# Patient Record
Sex: Female | Born: 1950
Health system: Southern US, Community
[De-identification: ages and names within clinical notes are randomized; demographics above are authoritative.]

## PROBLEM LIST (undated history)

## (undated) DIAGNOSIS — I10 Essential (primary) hypertension: Secondary | ICD-10-CM

## (undated) DIAGNOSIS — E785 Hyperlipidemia, unspecified: Secondary | ICD-10-CM

## (undated) DIAGNOSIS — T7840XA Allergy, unspecified, initial encounter: Secondary | ICD-10-CM

## (undated) HISTORY — DX: Essential (primary) hypertension: I10

## (undated) HISTORY — DX: Allergy, unspecified, initial encounter: T78.40XA

## (undated) HISTORY — PX: WRIST FRACTURE SURGERY: SHX121

## (undated) HISTORY — PX: FRACTURE SURGERY: SHX138

## (undated) HISTORY — DX: Hyperlipidemia, unspecified: E78.5

## (undated) HISTORY — PX: APPENDECTOMY: SHX54

---

## 1974-12-27 HISTORY — PX: ECTOPIC PREGNANCY SURGERY: SHX613

## 1997-02-18 ENCOUNTER — Encounter: Payer: Self-pay | Admitting: Family Medicine

## 2004-12-27 HISTORY — PX: TRIGGER FINGER RELEASE: SHX641

## 2005-02-24 DIAGNOSIS — Z8669 Personal history of other diseases of the nervous system and sense organs: Secondary | ICD-10-CM | POA: Insufficient documentation

## 2005-03-22 ENCOUNTER — Emergency Department: Payer: Self-pay | Admitting: Emergency Medicine

## 2006-06-21 ENCOUNTER — Ambulatory Visit: Payer: Self-pay | Admitting: Unknown Physician Specialty

## 2006-07-12 ENCOUNTER — Ambulatory Visit: Payer: Self-pay | Admitting: Unknown Physician Specialty

## 2006-08-04 ENCOUNTER — Emergency Department: Payer: Self-pay | Admitting: Emergency Medicine

## 2006-08-08 ENCOUNTER — Other Ambulatory Visit: Payer: Self-pay

## 2006-08-08 ENCOUNTER — Ambulatory Visit: Payer: Self-pay | Admitting: Specialist

## 2007-02-02 ENCOUNTER — Ambulatory Visit: Payer: Self-pay | Admitting: Unknown Physician Specialty

## 2007-06-01 ENCOUNTER — Ambulatory Visit: Payer: Self-pay | Admitting: Family Medicine

## 2008-02-15 LAB — CONVERTED CEMR LAB: Pap Smear: NORMAL

## 2008-03-15 ENCOUNTER — Ambulatory Visit: Payer: Self-pay | Admitting: Family Medicine

## 2008-03-15 DIAGNOSIS — E785 Hyperlipidemia, unspecified: Secondary | ICD-10-CM | POA: Insufficient documentation

## 2008-03-15 DIAGNOSIS — K219 Gastro-esophageal reflux disease without esophagitis: Secondary | ICD-10-CM | POA: Insufficient documentation

## 2008-03-15 DIAGNOSIS — I152 Hypertension secondary to endocrine disorders: Secondary | ICD-10-CM | POA: Insufficient documentation

## 2008-03-15 DIAGNOSIS — I10 Essential (primary) hypertension: Secondary | ICD-10-CM | POA: Insufficient documentation

## 2008-03-15 DIAGNOSIS — E1169 Type 2 diabetes mellitus with other specified complication: Secondary | ICD-10-CM | POA: Insufficient documentation

## 2008-03-21 ENCOUNTER — Ambulatory Visit: Payer: Self-pay | Admitting: Unknown Physician Specialty

## 2008-04-08 ENCOUNTER — Ambulatory Visit: Payer: Self-pay | Admitting: Family Medicine

## 2008-04-12 DIAGNOSIS — E119 Type 2 diabetes mellitus without complications: Secondary | ICD-10-CM

## 2008-04-12 DIAGNOSIS — E1159 Type 2 diabetes mellitus with other circulatory complications: Secondary | ICD-10-CM | POA: Insufficient documentation

## 2008-04-12 LAB — CONVERTED CEMR LAB
ALT: 20 units/L (ref 0–35)
Albumin: 3.7 g/dL (ref 3.5–5.2)
BUN: 14 mg/dL (ref 6–23)
Bilirubin, Direct: 0.1 mg/dL (ref 0.0–0.3)
CO2: 30 meq/L (ref 19–32)
Calcium: 9.1 mg/dL (ref 8.4–10.5)
Cholesterol: 165 mg/dL (ref 0–200)
Creatinine, Ser: 0.7 mg/dL (ref 0.4–1.2)
Glucose, Bld: 135 mg/dL — ABNORMAL HIGH (ref 70–99)
HDL: 51.4 mg/dL (ref >39.0)
Total Protein: 6.8 g/dL (ref 6.0–8.3)
Triglycerides: 80 mg/dL (ref 0–149)

## 2008-04-18 ENCOUNTER — Ambulatory Visit: Payer: Self-pay | Admitting: Family Medicine

## 2008-04-22 ENCOUNTER — Ambulatory Visit: Payer: Self-pay | Admitting: Family Medicine

## 2008-04-22 LAB — CONVERTED CEMR LAB
Creatinine,U: 143.8 mg/dL
Microalb, Ur: 0.4 mg/dL (ref 0.0–1.9)

## 2008-04-23 ENCOUNTER — Encounter: Payer: Self-pay | Admitting: Family Medicine

## 2008-04-29 ENCOUNTER — Telehealth: Payer: Self-pay | Admitting: Internal Medicine

## 2008-06-25 ENCOUNTER — Encounter: Payer: Self-pay | Admitting: Family Medicine

## 2008-07-15 ENCOUNTER — Ambulatory Visit: Payer: Self-pay | Admitting: Family Medicine

## 2008-07-19 ENCOUNTER — Ambulatory Visit: Payer: Self-pay | Admitting: Family Medicine

## 2008-07-19 ENCOUNTER — Telehealth (INDEPENDENT_AMBULATORY_CARE_PROVIDER_SITE_OTHER): Payer: Self-pay | Admitting: *Deleted

## 2008-07-19 ENCOUNTER — Telehealth: Payer: Self-pay | Admitting: Family Medicine

## 2008-07-19 DIAGNOSIS — F3342 Major depressive disorder, recurrent, in full remission: Secondary | ICD-10-CM | POA: Insufficient documentation

## 2008-07-19 DIAGNOSIS — F331 Major depressive disorder, recurrent, moderate: Secondary | ICD-10-CM | POA: Insufficient documentation

## 2008-07-31 ENCOUNTER — Ambulatory Visit: Payer: Self-pay | Admitting: Family Medicine

## 2008-08-08 ENCOUNTER — Ambulatory Visit: Payer: Self-pay | Admitting: Family Medicine

## 2008-08-21 ENCOUNTER — Ambulatory Visit: Payer: Self-pay | Admitting: Family Medicine

## 2008-09-19 ENCOUNTER — Telehealth: Payer: Self-pay | Admitting: Family Medicine

## 2008-10-21 ENCOUNTER — Ambulatory Visit: Payer: Self-pay | Admitting: Family Medicine

## 2008-10-22 LAB — CONVERTED CEMR LAB: Hgb A1c MFr Bld: 6.8 % — ABNORMAL HIGH (ref 4.6–6.0)

## 2008-10-24 ENCOUNTER — Ambulatory Visit: Payer: Self-pay | Admitting: Family Medicine

## 2009-02-20 ENCOUNTER — Ambulatory Visit: Payer: Self-pay | Admitting: Family Medicine

## 2009-02-25 LAB — CONVERTED CEMR LAB
Albumin: 3.9 g/dL (ref 3.5–5.2)
Bilirubin, Direct: 0.1 mg/dL (ref 0.0–0.3)
Calcium: 9.2 mg/dL (ref 8.4–10.5)
GFR calc Af Amer: 110 mL/min
Glucose, Bld: 109 mg/dL — ABNORMAL HIGH (ref 70–99)
HDL: 46.8 mg/dL (ref >39.0)
Sodium: 143 meq/L (ref 135–145)
Total Protein: 6.5 g/dL (ref 6.0–8.3)
VLDL: 22 mg/dL (ref 0–40)

## 2009-02-27 ENCOUNTER — Ambulatory Visit: Payer: Self-pay | Admitting: Family Medicine

## 2009-04-17 ENCOUNTER — Ambulatory Visit: Payer: Self-pay | Admitting: Family Medicine

## 2009-05-02 ENCOUNTER — Ambulatory Visit: Payer: Self-pay | Admitting: Family Medicine

## 2009-05-06 ENCOUNTER — Telehealth: Payer: Self-pay | Admitting: Family Medicine

## 2009-05-08 ENCOUNTER — Ambulatory Visit: Payer: Self-pay | Admitting: Unknown Physician Specialty

## 2009-06-24 ENCOUNTER — Ambulatory Visit: Payer: Self-pay | Admitting: Family Medicine

## 2009-06-26 LAB — HM DIABETES EYE EXAM: HM Diabetic Eye Exam: NORMAL

## 2009-06-27 LAB — CONVERTED CEMR LAB
ALT: 17 units/L (ref 0–35)
BUN: 12 mg/dL (ref 6–23)
CO2: 30 meq/L (ref 19–32)
Chloride: 107 meq/L (ref 96–112)
Cholesterol: 150 mg/dL (ref 0–200)
Creatinine, Ser: 0.8 mg/dL (ref 0.4–1.2)
Glucose, Bld: 130 mg/dL — ABNORMAL HIGH (ref 70–99)
Hgb A1c MFr Bld: 6.6 % — ABNORMAL HIGH (ref 4.6–6.5)
LDL Cholesterol: 82 mg/dL (ref 0–99)
Microalb, Ur: 0.6 mg/dL (ref 0.0–1.9)
Total Protein: 7.1 g/dL (ref 6.0–8.3)
Triglycerides: 91 mg/dL (ref 0.0–149.0)

## 2009-07-01 ENCOUNTER — Ambulatory Visit: Payer: Self-pay | Admitting: Family Medicine

## 2009-09-29 ENCOUNTER — Ambulatory Visit: Payer: Self-pay | Admitting: Family Medicine

## 2009-10-24 ENCOUNTER — Ambulatory Visit: Payer: Self-pay | Admitting: Family Medicine

## 2009-12-05 ENCOUNTER — Ambulatory Visit: Payer: Self-pay | Admitting: Family Medicine

## 2010-01-27 ENCOUNTER — Telehealth: Payer: Self-pay | Admitting: Family Medicine

## 2010-03-11 ENCOUNTER — Ambulatory Visit: Payer: Self-pay | Admitting: Family Medicine

## 2010-03-18 ENCOUNTER — Telehealth: Payer: Self-pay | Admitting: Family Medicine

## 2010-03-20 ENCOUNTER — Ambulatory Visit: Payer: Self-pay | Admitting: Family Medicine

## 2010-03-24 ENCOUNTER — Telehealth: Payer: Self-pay | Admitting: Family Medicine

## 2010-04-08 ENCOUNTER — Telehealth: Payer: Self-pay | Admitting: Family Medicine

## 2010-10-14 ENCOUNTER — Telehealth: Payer: Self-pay | Admitting: Family Medicine

## 2010-10-19 ENCOUNTER — Ambulatory Visit: Payer: Self-pay | Admitting: Family Medicine

## 2010-10-20 LAB — CONVERTED CEMR LAB
AST: 16 units/L (ref 0–37)
Albumin: 3.8 g/dL (ref 3.5–5.2)
Alkaline Phosphatase: 84 units/L (ref 39–117)
Bilirubin, Direct: 0.1 mg/dL (ref 0.0–0.3)
CO2: 29 meq/L (ref 19–32)
GFR calc non Af Amer: 95.05 mL/min (ref >60)
Glucose, Bld: 109 mg/dL — ABNORMAL HIGH (ref 70–99)
Hgb A1c MFr Bld: 6.8 % — ABNORMAL HIGH (ref 4.6–6.5)
Potassium: 3.9 meq/L (ref 3.5–5.1)
Sodium: 139 meq/L (ref 135–145)
Total CHOL/HDL Ratio: 3
Total Protein: 6.4 g/dL (ref 6.0–8.3)
VLDL: 18 mg/dL (ref 0.0–40.0)

## 2010-10-23 ENCOUNTER — Ambulatory Visit: Payer: Self-pay | Admitting: Family Medicine

## 2010-10-29 ENCOUNTER — Ambulatory Visit: Payer: Self-pay | Admitting: Family Medicine

## 2010-10-29 ENCOUNTER — Encounter: Payer: Self-pay | Admitting: Family Medicine

## 2010-10-30 ENCOUNTER — Encounter (INDEPENDENT_AMBULATORY_CARE_PROVIDER_SITE_OTHER): Payer: Self-pay | Admitting: *Deleted

## 2011-01-26 NOTE — Assessment & Plan Note (Signed)
Summary: BODY ACHES,RINGING IN EARS,CONGESTION   Vital Signs:  Patient profile:   60 year old female Height:      65.75 inches Weight:      189.50 pounds BMI:     30.93 Temp:     98.3 degrees F oral Pulse rate:   84 / minute Pulse rhythm:   regular BP sitting:   132 / 82  (left arm) Cuff size:   regular  Vitals Entered By: Delilah Shan CMA Duncan Dull) (March 11, 2010 11:23 AM) CC: Body aches, ringing in ears, congestion   History of Present Illness: 58 with URI symptoms- over 1 week of body aches, sinus pressure congestion, dry cough. Subjective fevers. No n/v/d. Feels like her sinus pressure is getting worse instead of better. This morning, both ears hurt tremendously and were ringing. No CP, SOB.   Current Medications (verified): 1)  Benazepril-Hydrochlorothiazide 20-12.5 Mg  Tabs (Benazepril-Hydrochlorothiazide) .... Take 1 Tablet By Mouth Once A Day 2)  Crestor 10 Mg Tabs (Rosuvastatin Calcium) .Marland Kitchen.. 1 Tab By Mouth Daily 3)  Fluticasone Propionate 50 Mcg/act  Susp (Fluticasone Propionate) .... As Needed 4)  Multivitamins   Tabs (Multiple Vitamin) .... Take 1 Tablet By Mouth Once A Day 5)  Onetouch Ultra Mini W/device  Kit (Blood Glucose Monitoring Suppl) .... Test Daily or As Directed 6)  Effexor Xr 37.5 Mg  Xr24h-Cap (Venlafaxine Hcl) .Marland Kitchen.. 1 Tab By Mouth Every Other Day 7)  Flonase 50 Mcg/act Susp (Fluticasone Propionate) .... 2 Sprays Each Nostril A Day For Nasal Congestion/allergic Rhinitis 8)  Betamethasone Dipropionate 0.05 % Crea (Betamethasone Dipropionate) .... Aaa Two Times A Day X 2 Weeks, Call If Not Resolving 9)  Amoxicillin 500 Mg Tabs (Amoxicillin) .Marland Kitchen.. 1 Tab By Mouth Two Times A Day X 10 Days  Allergies: 1)  ! Sulfa  Review of Systems      See HPI General:  Complains of chills and fever. ENT:  Complains of earache, nasal congestion, sinus pressure, and sore throat; denies ear discharge. CV:  Denies chest pain or discomfort. Resp:  Complains of cough;  denies shortness of breath, sputum productive, and wheezing.  Physical Exam  General:  Overweight appearin female in NAD VSS Ears:  TMs retracted bilaterally Nose:  nasal dischargemucosal pallor.   sinuses TTP throughout Mouth:  MMM Lungs:  Normal respiratory effort, chest expands symmetrically. Lungs are clear to auscultation, no crackles or wheezes. Heart:  Normal rate and regular rhythm. S1 and S2 normal without gallop, murmur, click, rub or other extra sounds. Extremities:  no edmea Psych:  Cognition and judgment appear intact. Alert and cooperative with normal attention span and concentration. No apparent delusions, illusions, hallucinations   Impression & Recommendations:  Problem # 1:  OTHER ACUTE SINUSITIS (ICD-461.8) Assessment New Given duration and progression of symptoms, will treat for bacterial sinusitis with amoxicillin. See pt instructions for details. Her updated medication list for this problem includes:    Fluticasone Propionate 50 Mcg/act Susp (Fluticasone propionate) .Marland Kitchen... As needed    Flonase 50 Mcg/act Susp (Fluticasone propionate) .Marland Kitchen... 2 sprays each nostril a day for nasal congestion/allergic rhinitis    Amoxicillin 500 Mg Tabs (Amoxicillin) .Marland Kitchen... 1 tab by mouth two times a day x 10 days  Complete Medication List: 1)  Benazepril-hydrochlorothiazide 20-12.5 Mg Tabs (Benazepril-hydrochlorothiazide) .... Take 1 tablet by mouth once a day 2)  Crestor 10 Mg Tabs (Rosuvastatin calcium) .Marland Kitchen.. 1 tab by mouth daily 3)  Fluticasone Propionate 50 Mcg/act Susp (Fluticasone propionate) .... As needed 4)  Multivitamins Tabs (Multiple vitamin) .... Take 1 tablet by mouth once a day 5)  Onetouch Ultra Mini W/device Kit (Blood glucose monitoring suppl) .... Test daily or as directed 6)  Effexor Xr 37.5 Mg Xr24h-cap (Venlafaxine hcl) .Marland Kitchen.. 1 tab by mouth every other day 7)  Flonase 50 Mcg/act Susp (Fluticasone propionate) .... 2 sprays each nostril a day for nasal  congestion/allergic rhinitis 8)  Betamethasone Dipropionate 0.05 % Crea (Betamethasone dipropionate) .... Aaa two times a day x 2 weeks, call if not resolving 9)  Amoxicillin 500 Mg Tabs (Amoxicillin) .Marland Kitchen.. 1 tab by mouth two times a day x 10 days  Patient Instructions: 1)  Take antibiotic as directed.  Drink lots of fluids.  Treat sympotmatically with Mucinex, nasal saline irrigation, and Tylenol/Ibuprofen.   Call if not improving as expected in 5-7 days.  Prescriptions: AMOXICILLIN 500 MG TABS (AMOXICILLIN) 1 tab by mouth two times a day x 10 days  #20 x 0   Entered and Authorized by:   Ruthe Mannan MD   Signed by:   Ruthe Mannan MD on 03/11/2010   Method used:   [facility]        [facility] [facility]       [address]       [place], [place]  [address]       Ph: [phone] or [phone]       Fax: [phone]   RxID:   [id number]    Orders Added: 1)  Est. Patient Level III [34742]   Current Allergies (reviewed today): ! SULFA

## 2011-01-26 NOTE — Progress Notes (Signed)
Summary: burping  Phone Note Call from Patient Call back at [phone]   Caller: Patient Call For: Kerby Nora MD Summary of Call: for 1 week pt has had burping and gas on and off for entire day. Pt feels like has" bubble" at base of throat. Eating and drinking does not seem to effect the burping. Pt has been limiting dairy products. Pt taking Prilosec OTC each morning with no relief. Pt wonders if her meds could be causing this. Pt is going to check her schedule and call for lab and appt with Dr. Ermalene Searing before her Crestor runs out in next 30 days. [phone]. Please advise.   Initial call taken by: Lewanda Rife LPN,  January 27, 2010 10:52 AM  Follow-up for Phone Call        No specific medicaitons that she is on that is obvious culprit.Marland Kitchendoes she notice some association woth meds? Increase OTC prilosec to 40 mg daily if has not already. Follow-up by: Kerby Nora MD,  January 27, 2010 12:10 PM  Additional Follow-up for Phone Call Additional follow up Details #1::        patient advised.Consuello Masse CMA  Additional Follow-up by: Benny Lennert CMA Duncan Dull),  January 27, 2010 12:17 PM

## 2011-01-26 NOTE — Progress Notes (Signed)
Summary: ? reaction to amox  Phone Note Call from Patient Call back at Work Phone [phone]   Caller: Patient Summary of Call: Pt has been taking amox since 3/17 for fluid in her ears.  She broke out in a couple of red areas on sunday, these cleared up but she has a red area on her chin  today.  Doesnt itch, burns a little bit.  She didnt take her dose this morning.  The pressure in her ears and her sinus congestion are better.  Please advise on what she should do.  [facility]. Initial call taken by: Lowella Petties CMA,  March 18, 2010 10:38 AM  Follow-up for Phone Call        I would stop taking it.  Will add amoxicillin to her allergy list.  Thank you for letting us know. Follow-up by: Ruthe Mannan MD,  March 18, 2010 10:56 AM  Additional Follow-up for Phone Call Additional follow up Details #1::        Patient Advised.    Additional Follow-up by: Delilah Shan CMA Duncan Dull),  March 18, 2010 12:15 PM   New Allergies: ! AMOXICILLIN New Allergies: ! AMOXICILLIN

## 2011-01-26 NOTE — Assessment & Plan Note (Signed)
Summary: F/U LABWORK/CLE   Vital Signs:  Patient profile:   60 year old female Height:      65.75 inches Weight:      187.0 pounds BMI:     30.52 Temp:     98.0 degrees F oral Pulse rate:   84 / minute Pulse rhythm:   regular BP sitting:   110 / 78  (left arm) Cuff size:   regular  Vitals Entered By: Benny Lennert CMA Duncan Dull) (October 23, 2010 10:55 AM)  History of Present Illness: Chief complaint follow up labs  The patient is here for annual wellness exam and preventative care.     3 lb weight loss. Some exercise..plans on increasing exercsie.  Diabetes: Wellcontrolled with diet. ON no medicaiton.  Taking chromium.  BMI 30..interesetd in lap band procedure.   Hypertension History:      She denies headache, chest pain, dyspnea with exertion, peripheral edema, visual symptoms, neurologic problems, syncope, and side effects from treatment.  Well controlled at home. Marland Kitchen        Positive major cardiovascular risk factors include female age 26 years old or older, diabetes, hyperlipidemia, and hypertension.  Negative major cardiovascular risk factors include non-tobacco-user status.    Lipid Management History:      Positive NCEP/ATP III risk factors include female age 84 years old or older, diabetes, and hypertension.  Negative NCEP/ATP III risk factors include non-tobacco-user status.        The patient states that she knows about the "Therapeutic Lifestyle Change" diet.  Her compliance with the TLC diet is excellent.  The patient expresses understanding of adjunctive measures for cholesterol lowering.  Adjunctive measures started by the patient include aerobic exercise, fiber, and weight reduction.  She expresses no side effects from her lipid-lowering medication.  The patient denies any symptoms to suggest myopathy or liver disease.      Problems Prior to Update: 1)  Other Screening Mammogram  (ICD-V76.12) 2)  Ringworm  (ICD-110.9) 3)  Other Acute Sinusitis  (ICD-461.8) 4)   Skin Lesion  (ICD-709.9) 5)  Anxiety Depression  (ICD-300.4) 6)  Diabetes Mellitus, Type II, Without Complications  (ICD-250.00) 7)  Gerd  (ICD-530.81) 8)  Family History of Cad Female 1st Degree Relative <50  (ICD-V17.3) 9)  Family History of Cad Female 1st Degree Relative <60  (ICD-V16.49) 10)  Bell's Palsy, Hx of  (ICD-V12.49) 11)  Hypertension  (ICD-401.9) 12)  Hyperlipidemia  (ICD-272.4)  Current Medications (verified): 1)  Benazepril-Hydrochlorothiazide 20-12.5 Mg  Tabs (Benazepril-Hydrochlorothiazide) .... Take 1 Tablet By Mouth Once A Day 2)  Crestor 10 Mg Tabs (Rosuvastatin Calcium) .Marland Kitchen.. 1 Tab By Mouth Daily 3)  Fluticasone Propionate 50 Mcg/act  Susp (Fluticasone Propionate) .... As Needed 4)  Multivitamins   Tabs (Multiple Vitamin) .... Take 1 Tablet By Mouth Once A Day 5)  Onetouch Ultra Mini W/device  Kit (Blood Glucose Monitoring Suppl) .... Test Daily or As Directed 6)  Effexor Xr 37.5 Mg  Xr24h-Cap (Venlafaxine Hcl) .Marland Kitchen.. 1 Tab By Mouth Every Other Day 7)  Flonase 50 Mcg/act Susp (Fluticasone Propionate) .... 2 Sprays Each Nostril A Day For Nasal Congestion/allergic Rhinitis  Allergies: 1)  ! Sulfa 2)  ! Amoxicillin  Past History:  Past medical, surgical, family and social histories (including risk factors) reviewed, and no changes noted (except as noted below).  Past Medical History: Reviewed history from 03/15/2008 and no changes required. Hyperlipidemia Hypertension  Past Surgical History: 1976 ectopic pregnancy 2006 trigger finger 2007 broken left wrist,  surgical  repair with plate 1610 Appendectomy stress test, treadmill 2008 hx of radium treatment to forehead for hemangioma   Family History: Reviewed history from 03/15/2008 and no changes required. father: died age 62s massive MI mother: DM, CHF, CAD died age 37s  Family History of CAD Female 1st degree relative <60 Family History of CAD Female 1st degree relative <50 brother: MI age 62, DM brother:  CABG age 22 sister: WPW sister: valve replacement, TIA ? grandparents, but paternal died in 37s MGF: CVA MGM AAA, colon cancer  Social History: Reviewed history from 03/15/2008 and no changes required. Occupation: Runner, broadcasting/film/video, kindergarten Married adopted children Never Smoked Alcohol use-no Drug use-no Regular exercise-yes, walk dogs daily, occ treadmill 2 x a week Diet: grilled foods, fruits and veggies  Review of Systems General:  Denies fatigue and fever. CV:  Denies chest pain or discomfort. Resp:  Denies shortness of breath. GI:  Denies abdominal pain, bloody stools, constipation, and diarrhea. GU:  Denies dysuria. Derm:  Denies lesion(s). Psych:  Denies anxiety and depression; Has decreased to effexor every other day.Marland Kitchen  Physical Exam  General:  obese appearing female INNAD Eyes:  No corneal or conjunctival inflammation noted. EOMI. Perrla. Funduscopic exam benign, without hemorrhages, exudates or papilledema. Vision grossly normal. Ears:  External ear exam shows no significant lesions or deformities.  Otoscopic examination reveals clear canals, tympanic membranes are intact bilaterally without bulging, retraction, inflammation or discharge. Hearing is grossly normal bilaterally. Nose:  External nasal examination shows no deformity or inflammation. Nasal mucosa are pink and moist without lesions or exudates. Mouth:  Oral mucosa and oropharynx without lesions or exudates.  Teeth in good repair. Neck:  no carotid bruit or thyromegaly no cervical or supraclavicular lymphadenopathy  Lungs:  Normal respiratory effort, chest expands symmetrically. Lungs are clear to auscultation, no crackles or wheezes. Heart:  Normal rate and regular rhythm. S1 and S2 normal without gallop, murmur, click, rub or other extra sounds. Abdomen:  Bowel sounds positive,abdomen soft and non-tender without masses, organomegaly or hernias noted. Pulses:  R and L posterior tibial pulses are full and equal  bilaterally  Extremities:  no edema Skin:  Intact without suspicious lesions or rashes Psych:  Cognition and judgment appear intact. Alert and cooperative with normal attention span and concentration. No apparent delusions, illusions, hallucinations  Diabetes Management Exam:    Foot Exam (with socks and/or shoes not present):       Sensory-Pinprick/Light touch:          Left medial foot (L-4): normal          Left dorsal foot (L-5): normal          Left lateral foot (S-1): normal          Right medial foot (L-4): normal          Right dorsal foot (L-5): normal          Right lateral foot (S-1): normal       Sensory-Monofilament:          Left foot: normal          Right foot: normal       Inspection:          Left foot: normal          Right foot: normal       Nails:          Left foot: normal          Right foot: normal   Impression &  Recommendations:  Problem # 1:  DIABETES MELLITUS, TYPE II, WITHOUT COMPLICATIONS (ICD-250.00) Well controlled with diet.  Discussed exercise and weight loss. Her updated medication list for this problem includes:    Benazepril-hydrochlorothiazide 20-12.5 Mg Tabs (Benazepril-hydrochlorothiazide) .Marland Kitchen... Take 1 tablet by mouth once a day  Problem # 2:  HYPERLIPIDEMIA (ICD-272.4) Well controlled. Continue current medication.  Her updated medication list for this problem includes:    Crestor 10 Mg Tabs (Rosuvastatin calcium) .Marland Kitchen... 1 tab by mouth daily  Labs Reviewed: SGOT: 16 (10/19/2010)   SGPT: 14 (10/19/2010)  Lipid Goals: Chol Goal: 200 (07/01/2009)   HDL Goal: 40 (07/01/2009)   LDL Goal: 100 (07/01/2009)   TG Goal: 150 (07/01/2009)  10 Yr Risk Heart Disease: 8 % Prior 10 Yr Risk Heart Disease: 13 % (07/01/2009)   HDL:50.90 (10/19/2010), 49.80 (06/24/2009)  LDL:79 (10/19/2010), 82 (06/24/2009)  Chol:148 (10/19/2010), 150 (06/24/2009)  Trig:90.0 (10/19/2010), 91.0 (06/24/2009)  Problem # 3:  HYPERTENSION (ICD-401.9) Well controlled.  Continue current medication. Encouraged exercise, weight loss, healthy eating habits.  Her updated medication list for this problem includes:    Benazepril-hydrochlorothiazide 20-12.5 Mg Tabs (Benazepril-hydrochlorothiazide) .Marland Kitchen... Take 1 tablet by mouth once a day  Problem # 4:  ANXIETY DEPRESSION (ICD-300.4) Well controlled.   Complete Medication List: 1)  Benazepril-hydrochlorothiazide 20-12.5 Mg Tabs (Benazepril-hydrochlorothiazide) .... Take 1 tablet by mouth once a day 2)  Crestor 10 Mg Tabs (Rosuvastatin calcium) .Marland Kitchen.. 1 tab by mouth daily 3)  Fluticasone Propionate 50 Mcg/act Susp (Fluticasone propionate) .... As needed 4)  Multivitamins Tabs (Multiple vitamin) .... Take 1 tablet by mouth once a day 5)  Onetouch Ultra Mini W/device Kit (Blood glucose monitoring suppl) .... Test daily or as directed 6)  Effexor Xr 37.5 Mg Xr24h-cap (Venlafaxine hcl) .Marland Kitchen.. 1 tab by mouth every other day 7)  Flonase 50 Mcg/act Susp (Fluticasone propionate) .... 2 sprays each nostril a day for nasal congestion/allergic rhinitis  Hypertension Assessment/Plan:      The patient's hypertensive risk group is category C: Target organ damage and/or diabetes.  Her calculated 10 year risk of coronary heart disease is 8 %.  Today's blood pressure is 110/78.  Her blood pressure goal is < 130/80.  Lipid Assessment/Plan:      Based on NCEP/ATP III, the patient's risk factor category is "history of diabetes".  The patient's lipid goals are as follows: Total cholesterol goal is 200; LDL cholesterol goal is 100; HDL cholesterol goal is 40; Triglyceride goal is 150.  Her LDL cholesterol goal has been met.    Patient Instructions: 1)  Marion..can you get information about the lap band information session at [facility] sugery that they have..come ask me if you have questions.  2)  Please schedule a follow-up appointment in 6 months CPX. 3)   Fasting lipids, CMET , A1C, microalbumin Dx 250.00   Orders Added: 1)  Est.  Patient Level IV [[address]109]    Influenza Vaccine (to be given today)  Current Allergies (reviewed today): ! SULFA ! AMOXICILLIN  Last Flu Vaccine:  Fluvax 3+ (10/24/2008 2:58:18 PM) Flu Vaccine Result Date:  10/23/2010 Flu Vaccine Result:  given Flu Vaccine Next Due:  1 yr Herpes Zoster Next Due:  Refused Mammogram Result Date:  10/27/2009 Mammogram Result:  normal Mammogram Next Due:  1 yr    Past Medical History:    Reviewed history from 03/15/2008 and no changes required:       Hyperlipidemia       Hypertension  Past Surgical History:  Reviewed history from 03/15/2008 and no changes required:       1976 ectopic pregnancy       2006 trigger finger       2007 broken left wrist, surgical  repair with plate       1610 Appendectomy       stress test, treadmill 2008       hx of radium treatment to forehead for hemangioma

## 2011-01-26 NOTE — Progress Notes (Signed)
Summary: Ringworm no better  Phone Note Call from Patient Call back at [facility] Phone [phone]   Caller: Patient Call For: Dr. Dayton Martes Summary of Call: Patient stated that she is being treated for ringworm.  She says that she really can't tell much difference that the medication is even working.  Wanted to know if she could double up on the Terbinafine HCL 250mg , and I advised her not to double up but continue taking it as Dr. Dayton Martes prescribed.  Advise that it may take a little longer to clear up but finish all medication.  Advised her to call us back if any changes or if she doesn't get any better within the next few days.  Initial call taken by: Linde Gillis CMA Duncan Dull),  March 24, 2010 9:03 AM  Follow-up for Phone Call        Agreed, please don't double dose. Follow-up by: Ruthe Mannan MD,  March 24, 2010 9:22 AM

## 2011-01-26 NOTE — Assessment & Plan Note (Signed)
Summary: ? RINGWORM ON CHIN   Vital Signs:  Patient profile:   60 year old female Height:      65.75 inches Weight:      190.13 pounds BMI:     31.03 Temp:     97.6 degrees F oral Pulse rate:   84 / minute Pulse rhythm:   regular BP sitting:   112 / 70  (left arm) Cuff size:   regular  Vitals Entered By: Delilah Shan CMA Duncan Dull) (March 20, 2010 10:56 AM) CC: ? ringworm   History of Present Illness: 60 yo with itchy circular rash on left chin x 1 day. 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 No lesions elsewhere, none in scalp that she is aware of.   Current Medications (verified): 1)  Benazepril-Hydrochlorothiazide 20-12.5 Mg  Tabs (Benazepril-Hydrochlorothiazide) .... Take 1 Tablet By Mouth Once A Day 2)  Crestor 10 Mg Tabs (Rosuvastatin Calcium) .Marland Kitchen.. 1 Tab By Mouth Daily 3)  Fluticasone Propionate 50 Mcg/act  Susp (Fluticasone Propionate) .... As Needed 4)  Multivitamins   Tabs (Multiple Vitamin) .... Take 1 Tablet By Mouth Once A Day 5)  Onetouch Ultra Mini W/device  Kit (Blood Glucose Monitoring Suppl) .... Test Daily or As Directed 6)  Effexor Xr 37.5 Mg  Xr24h-Cap (Venlafaxine Hcl) .Marland Kitchen.. 1 Tab By Mouth Every Other Day 7)  Flonase 50 Mcg/act Susp (Fluticasone Propionate) .... 2 Sprays Each Nostril A Day For Nasal Congestion/allergic Rhinitis 8)  Betamethasone Dipropionate 0.05 % Crea (Betamethasone Dipropionate) .... Aaa Two Times A Day X 2 Weeks, Call If Not Resolving 9)  Terbinafine Hcl 250 Mg Tabs (Terbinafine Hcl) .Marland Kitchen.. 1 Tab By Mouth Daily X 2 Weeks 10)  Miconazole 7 2 % Crea (Miconazole Nitrate) .... Apply To Area Daily X 2 Weeks.  Allergies: 1)  ! Sulfa 2)  ! Amoxicillin  Review of Systems Derm:  Complains of itching and rash.  Physical Exam  General:  Overweight appearin female in NAD VSS Skin:  circular lesion on left chin with central clearing No lesions on scalp Psych:  Cognition and judgment appear intact. Alert and cooperative with  normal attention span and concentration. No apparent delusions, illusions, hallucinations   Impression & Recommendations:  Problem # 1:  RINGWORM (ICD-110.9) Assessment New Although lesion is not on scalp, I do feel she would benefit more from oral rather than topical therapy since it is in such a prominent area on her face.  Pt agrees with plan.  Will do a two week course of terbinafine.    Complete Medication List: 1)  Benazepril-hydrochlorothiazide 20-12.5 Mg Tabs (Benazepril-hydrochlorothiazide) .... Take 1 tablet by mouth once a day 2)  Crestor 10 Mg Tabs (Rosuvastatin calcium) .Marland Kitchen.. 1 tab by mouth daily 3)  Fluticasone Propionate 50 Mcg/act Susp (Fluticasone propionate) .... As needed 4)  Multivitamins Tabs (Multiple vitamin) .... Take 1 tablet by mouth once a day 5)  Onetouch Ultra Mini W/device Kit (Blood glucose monitoring suppl) .... Test daily or as directed 6)  Effexor Xr 37.5 Mg Xr24h-cap (Venlafaxine hcl) .Marland Kitchen.. 1 tab by mouth every other day 7)  Flonase 50 Mcg/act Susp (Fluticasone propionate) .... 2 sprays each nostril a day for nasal congestion/allergic rhinitis 8)  Betamethasone Dipropionate 0.05 % Crea (Betamethasone dipropionate) .... Aaa two times a day x 2 weeks, call if not resolving 9)  Terbinafine Hcl 250 Mg Tabs (Terbinafine hcl) .Marland Kitchen.. 1 tab by mouth daily x 2 weeks 10)  Miconazole 7 2 % Crea (Miconazole  nitrate) .... Apply to area daily x 2 weeks. Prescriptions: MICONAZOLE 7 2 % CREA (MICONAZOLE NITRATE) apply to area daily x 2 weeks.  #15 g x 0   Entered and Authorized by:   Ruthe Mannan MD   Signed by:   Ruthe Mannan MD on 03/20/2010   Method used:   [facility] [id number]       [address]       [place], [place]  [address]       Ph: [phone] or [phone]       Fax: [phone]   RxID:   [id number] TERBINAFINE HCL 250 MG TABS (TERBINAFINE HCL) 1 tab by mouth daily x 2 weeks  #14 x 0   Entered and Authorized by:    Ruthe Mannan MD   Signed by:   Ruthe Mannan MD on 03/20/2010   Method used:   [facility]        [facility] [facility]retail)       [address]       [place], [place]  [address]       Ph: [phone] or [phone]       Fax: [phone]   RxID:   [id number]   Current Allergies (reviewed today): ! SULFA ! AMOXICILLIN  Appended Document: ? RINGWORM ON CHIN Script for terbenifine cancelled at [facility], called to [facility]- it is on their $4.00 list.

## 2011-01-26 NOTE — Progress Notes (Signed)
Summary: needs order for mammogram  Phone Note Call from Patient Call back at Home Phone [phone]   Caller: Patient Call For: Kerby Nora MD Summary of Call: Pt needs order for mammogram, she gets these at [facility]. Initial call taken by: Lowella Petties CMA,  October 14, 2010 8:27 AM  New Problems: OTHER SCREENING MAMMOGRAM (ICD-V76.12)   New Problems: OTHER SCREENING MAMMOGRAM (ICD-V76.12)

## 2011-01-26 NOTE — Progress Notes (Signed)
Summary: regarding ringworm  Phone Note Call from Patient Call back at [facility] Phone [phone]   Caller: Patient Summary of Call: Pt has been using cream for ringworm on her face. She had generic lamisil but has finished that.  The rash is gone but there is still a pink discoloration.  She is asking if that will go away in time or does she need more medicine.  Should she continue to use the cream? Initial call taken by: Lowella Petties CMA,  April 08, 2010 10:07 AM  Follow-up for Phone Call        Hopefully the discoloration will go away.  I would not put anymore cream on, let it heal on its own at this point. Follow-up by: Ruthe Mannan MD,  April 08, 2010 10:23 AM  Additional Follow-up for Phone Call Additional follow up Details #1::        Patient Advised.  Additional Follow-up by: Delilah Shan CMA Duncan Dull),  April 08, 2010 10:31 AM

## 2011-01-26 NOTE — Letter (Signed)
Summary: Results Follow up Letter  [place] [facility]  [address] [place], [place] [address]   Phone: [phone]  Fax: [phone]    10/30/2010 MRN: [id number]     Meghan Kerr [address] [place], [place]  [address]    Dear Ms. Eddington,  The following are the results of your recent test(s):  Test         Result    Pap Smear:        Normal _____  Not Normal _____ Comments: ______________________________________________________ Cholesterol: LDL(Bad cholesterol):         Your goal is less than:         HDL (Good cholesterol):       Your goal is more than: Comments:  ______________________________________________________ Mammogram:        Normal __x___  Not Normal _____ Comments:Repeat in 1 year  ___________________________________________________________________ Hemoccult:        Normal _____  Not normal _______ Comments:    _____________________________________________________________________ Other Tests:    We routinely do not discuss normal results over the telephone.  If you desire a copy of the results, or you have any questions about this information we can discuss them at your next office visit.   Sincerely,   Kerby Nora MD

## 2011-03-02 ENCOUNTER — Encounter: Payer: Self-pay | Admitting: Family Medicine

## 2011-03-03 ENCOUNTER — Ambulatory Visit (INDEPENDENT_AMBULATORY_CARE_PROVIDER_SITE_OTHER): Payer: Self-pay | Admitting: Family Medicine

## 2011-03-03 ENCOUNTER — Encounter: Payer: Self-pay | Admitting: Family Medicine

## 2011-03-03 DIAGNOSIS — J018 Other acute sinusitis: Secondary | ICD-10-CM

## 2011-03-09 NOTE — Assessment & Plan Note (Signed)
Summary: ?SINUS INFECTION/CLE  [employer]   Vital Signs:  Patient profile:   60 year old female Height:      65.75 inches Weight:      191.50 pounds BMI:     31.26 Temp:     98.1 degrees F oral Pulse rate:   71 / minute Pulse rhythm:   regular BP sitting:   140 / 80  (right arm) Cuff size:   regular  Vitals Entered By: Linde Gillis CMA Duncan Dull) (March 03, 2011 12:30 PM) CC: ? sinus infection   History of Present Illness: 60 yo here for ? sinus infection.  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  Started with runny nose, scratchy throat a few days ago. Today felt very tired, chills and sinus pressure. No fever. Taking OTC mucinex with mild relief of symptoms.  Dry hacking cough. No wheezing or SOB.  Current Medications (verified): 1)  Benazepril-Hydrochlorothiazide 20-12.5 Mg  Tabs (Benazepril-Hydrochlorothiazide) .... Take 1 Tablet By Mouth Once A Day 2)  Crestor 10 Mg Tabs (Rosuvastatin Calcium) .Marland Kitchen.. 1 Tab By Mouth Daily 3)  Fluticasone Propionate 50 Mcg/act  Susp (Fluticasone Propionate) .... As Needed 4)  Multivitamins   Tabs (Multiple Vitamin) .... Take 1 Tablet By Mouth Once A Day 5)  Onetouch Ultra Mini W/device  Kit (Blood Glucose Monitoring Suppl) .... Test Daily or As Directed 6)  Effexor Xr 37.5 Mg  Xr24h-Cap (Venlafaxine Hcl) .Marland Kitchen.. 1 Tab By Mouth Every Other Day 7)  Flonase 50 Mcg/act Susp (Fluticasone Propionate) .... 2 Sprays Each Nostril A Day For Nasal Congestion/allergic Rhinitis 8)  Tussionex Pennkinetic Er 8-10 Mg/29ml Lqcr (Chlorpheniramine-Hydrocodone) .... 5 Ml By Mouth Two Times A Day As Needed Cough 9)  Azithromycin 250 Mg  Tabs (Azithromycin) .... 2 By  Mouth Today and Then 1 Daily For 4 Days  Allergies: 1)  ! Sulfa 2)  ! Amoxicillin  Past History:  Past Medical History: Last updated: 2008-04-13 Hyperlipidemia Hypertension  Past Surgical History: Last updated: 10/23/2010 1976 ectopic pregnancy 2006 trigger  finger 2007 broken left wrist, surgical  repair with plate 0454 Appendectomy stress test, treadmill 2008 hx of radium treatment to forehead for hemangioma   Family History: Last updated: 04/13/08 father: died age 93s massive MI mother: DM, CHF, CAD died age 32s  Family History of CAD Female 1st degree relative <60 Family History of CAD Female 1st degree relative <50 brother: MI age 78, DM brother: CABG age 78 sister: WPW sister: valve replacement, TIA ? grandparents, but paternal died in 42s MGF: CVA MGM AAA, colon cancer  Social History: Last updated: April 13, 2008 Occupation: Runner, broadcasting/film/video, kindergarten Married adopted children Never Smoked Alcohol use-no Drug use-no Regular exercise-yes, walk dogs daily, occ treadmill 2 x a week Diet: grilled foods, fruits and veggies  Risk Factors: Exercise: yes (Apr 13, 2008)  Risk Factors: Smoking Status: never (2008/04/13)  Review of Systems      See HPI General:  Complains of chills and fatigue; denies fever. ENT:  Complains of nasal congestion, sinus pressure, and sore throat. Resp:  Complains of cough; denies shortness of breath, sputum productive, and wheezing.  Physical Exam  General:  obese appearing female NAD VSS Ears:  External ear exam shows no significant lesions or deformities.  Otoscopic examination reveals clear canals, tympanic membranes are intact bilaterally without bulging, retraction, inflammation or discharge. Hearing is grossly normal bilaterally. Nose:  boggy turbinates, sinuses +/- TTP Mouth:  Oral mucosa and oropharynx without lesions or exudates.  Teeth in good repair. Lungs:  Normal respiratory effort, chest expands symmetrically. Lungs are clear to auscultation, no crackles or wheezes. Heart:  Normal rate and regular rhythm. S1 and S2 normal without gallop, murmur, click, rub or other extra sounds. Extremities:  no edema Psych:  Cognition and judgment appear intact. Alert and cooperative with normal  attention span and concentration. No apparent delusions, illusions, hallucinations   Impression & Recommendations:  Problem # 1:  OTHER ACUTE SINUSITIS (ICD-461.8) Assessment New Given rx for Zpack to fill if symptoms do not improve. Continue mucinex, flonase. Tussionex as needed cough- see pt instructions for details. Her updated medication list for this problem includes:    Fluticasone Propionate 50 Mcg/act Susp (Fluticasone propionate) .Marland Kitchen... As needed    Flonase 50 Mcg/act Susp (Fluticasone propionate) .Marland Kitchen... 2 sprays each nostril a day for nasal congestion/allergic rhinitis    Tussionex Pennkinetic Er 8-10 Mg/46ml Lqcr (Chlorpheniramine-hydrocodone) .Marland KitchenMarland KitchenMarland KitchenMarland Kitchen 5 ml by mouth two times a day as needed cough    Azithromycin 250 Mg Tabs (Azithromycin) .Marland Kitchen... 2 by  mouth today and then 1 daily for 4 days  Complete Medication List: 1)  Benazepril-hydrochlorothiazide 20-12.5 Mg Tabs (Benazepril-hydrochlorothiazide) .... Take 1 tablet by mouth once a day 2)  Crestor 10 Mg Tabs (Rosuvastatin calcium) .Marland Kitchen.. 1 tab by mouth daily 3)  Fluticasone Propionate 50 Mcg/act Susp (Fluticasone propionate) .... As needed 4)  Multivitamins Tabs (Multiple vitamin) .... Take 1 tablet by mouth once a day 5)  Onetouch Ultra Mini W/device Kit (Blood glucose monitoring suppl) .... Test daily or as directed 6)  Effexor Xr 37.5 Mg Xr24h-cap (Venlafaxine hcl) .Marland Kitchen.. 1 tab by mouth every other day 7)  Flonase 50 Mcg/act Susp (Fluticasone propionate) .... 2 sprays each nostril a day for nasal congestion/allergic rhinitis 8)  Tussionex Pennkinetic Er 8-10 Mg/58ml Lqcr (Chlorpheniramine-hydrocodone) .... 5 ml by mouth two times a day as needed cough 9)  Azithromycin 250 Mg Tabs (Azithromycin) .... 2 by  mouth today and then 1 daily for 4 days  Patient Instructions: 1)  Fill your prescription for your antibiotic (Zpack) if symptoms do not improve over the next several day.  Drink lots of fluids.  Treat sympotmatically with Mucinex,  nasal saline irrigation, and Tylenol/IbuprofenYou can use warm compresses.  Cough suppressant at night. Call if not improving as expected in 5-7 days.  Prescriptions: AZITHROMYCIN 250 MG  TABS (AZITHROMYCIN) 2 by  mouth today and then 1 daily for 4 days  #6 x 0   Entered and Authorized by:   Ruthe Mannan MD   Signed by:   Ruthe Mannan MD on 03/03/2011   Method used:   Print then Give to Patient   RxID:   [id number] TUSSIONEX PENNKINETIC ER 8-10 MG/5ML LQCR (CHLORPHENIRAMINE-HYDROCODONE) 5 ml by mouth two times a day as needed cough  #5 ounces x 0   Entered and Authorized by:   Ruthe Mannan MD   Signed by:   Ruthe Mannan MD on 03/03/2011   Method used:   Print then Give to Patient   RxID:   [id number]    Orders Added: 1)  Est. Patient Level III [47425]    Current Allergies (reviewed today): ! SULFA ! AMOXICILLIN

## 2011-03-25 LAB — LIPID PANEL
Cholesterol: 126 mg/dL (ref 0–200)
HDL: 48 mg/dL (ref 35–70)
LDL Cholesterol: 13 mg/dL
LDl/HDL Ratio: 2.6

## 2011-03-25 LAB — BASIC METABOLIC PANEL
BUN: 17 mg/dL (ref 4–21)
Creatinine: 0.8 mg/dL (ref 0.5–1.1)

## 2011-03-25 LAB — HEPATIC FUNCTION PANEL: Bilirubin, Total: 0.4 mg/dL

## 2011-03-25 LAB — CBC AND DIFFERENTIAL
Neutrophils Absolute: 60 /uL
Platelets: 311 10*3/uL (ref 150–399)

## 2011-04-15 ENCOUNTER — Encounter: Payer: Self-pay | Admitting: Family Medicine

## 2011-04-15 ENCOUNTER — Other Ambulatory Visit: Payer: Self-pay

## 2011-04-19 ENCOUNTER — Other Ambulatory Visit: Payer: Self-pay | Admitting: Family Medicine

## 2011-04-23 ENCOUNTER — Encounter: Payer: Self-pay | Admitting: Family Medicine

## 2011-04-23 ENCOUNTER — Ambulatory Visit (INDEPENDENT_AMBULATORY_CARE_PROVIDER_SITE_OTHER): Payer: BC Managed Care – PPO | Admitting: Family Medicine

## 2011-04-23 ENCOUNTER — Other Ambulatory Visit (HOSPITAL_COMMUNITY)
Admission: RE | Admit: 2011-04-23 | Discharge: 2011-04-23 | Disposition: A | Discharge status: Home / Self Care | Payer: BC Managed Care – PPO | Source: Ambulatory Visit | Attending: Family Medicine | Admitting: Family Medicine

## 2011-04-23 ENCOUNTER — Encounter: Payer: Self-pay | Admitting: *Deleted

## 2011-04-23 DIAGNOSIS — E119 Type 2 diabetes mellitus without complications: Secondary | ICD-10-CM

## 2011-04-23 DIAGNOSIS — I10 Essential (primary) hypertension: Secondary | ICD-10-CM

## 2011-04-23 DIAGNOSIS — Z1231 Encounter for screening mammogram for malignant neoplasm of breast: Secondary | ICD-10-CM

## 2011-04-23 DIAGNOSIS — Z01419 Encounter for gynecological examination (general) (routine) without abnormal findings: Secondary | ICD-10-CM | POA: Insufficient documentation

## 2011-04-23 DIAGNOSIS — Z13820 Encounter for screening for osteoporosis: Secondary | ICD-10-CM

## 2011-04-23 DIAGNOSIS — Z0000 Encounter for general adult medical examination without abnormal findings: Secondary | ICD-10-CM

## 2011-04-23 DIAGNOSIS — K219 Gastro-esophageal reflux disease without esophagitis: Secondary | ICD-10-CM

## 2011-04-23 DIAGNOSIS — Z780 Asymptomatic menopausal state: Secondary | ICD-10-CM

## 2011-04-23 DIAGNOSIS — Z1159 Encounter for screening for other viral diseases: Secondary | ICD-10-CM | POA: Insufficient documentation

## 2011-04-23 DIAGNOSIS — E785 Hyperlipidemia, unspecified: Secondary | ICD-10-CM

## 2011-04-23 DIAGNOSIS — F341 Dysthymic disorder: Secondary | ICD-10-CM

## 2011-04-23 NOTE — Assessment & Plan Note (Signed)
Well controlled with diet. 

## 2011-04-23 NOTE — Assessment & Plan Note (Signed)
Inadequate control...increase prilosec to 40 mg daily. Discussed diet changes.

## 2011-04-23 NOTE — Assessment & Plan Note (Signed)
Very well controlled.. Will stop crestor and recheck in next 3 months.

## 2011-04-23 NOTE — Progress Notes (Signed)
Subjective:    Patient ID: Meghan Kerr, female    DOB: 03/25/1951, 60 y.o.   MRN: [id number]  HPI  Diabetes: Well controlled Using medications without difficulties: Hypoglycemic episodes:None Hyperglycemic episodes:NONE Feet problems:None Blood Sugars averaging:not checking eye exam within last year:yes  Hypertension:  Well controlled  Using medication without problems or lightheadedness:  Chest pain with exertion: None  Edema: None Short of breath: None Average home BPs:   Elevated Cholesterol: Well controlled Using medications without problems. Muscle aches:  Other complaints:  [facility]Marland Kitchen Lost 10 lbs. Stopped this med because was causing constiption, stomach irritation and burping.  GERD, poor control with pepcid AC, prilosec 20 mg. Almost daily symptoms. In past nexium helped.   Constipation.. Last BM yesterday. Walking daily, working on [employer], lots of water, some fiber.  Review of Systems  Constitutional: Negative for fever, fatigue and unexpected weight change.  HENT: Negative for ear pain, congestion, sore throat, sneezing, trouble swallowing and sinus pressure.   Eyes: Negative for pain and itching.  Respiratory: Negative for cough, shortness of breath and wheezing.   Cardiovascular: Negative for chest pain, palpitations and leg swelling.  Gastrointestinal: Negative for nausea, abdominal pain, diarrhea, constipation and blood in stool.  Genitourinary: Negative for dysuria, hematuria, vaginal discharge, difficulty urinating and menstrual problem.  Skin: Negative for rash.  Neurological: Negative for syncope, weakness, light-headedness, numbness and headaches.  Psychiatric/Behavioral: Negative for confusion and dysphoric mood. The patient is not nervous/anxious.        Objective:   Physical Exam  Constitutional: Vital signs are normal. She appears well-developed and well-nourished. She is cooperative.  Non-toxic  appearance. She does not appear ill. No distress.  HENT:  Head: Normocephalic.  Right Ear: Hearing, tympanic membrane, external ear and ear canal normal.  Left Ear: Hearing, tympanic membrane, external ear and ear canal normal.  Nose: Nose normal.  Eyes: Conjunctivae, EOM and lids are normal. Pupils are equal, round, and reactive to light. No foreign bodies found.  Neck: Trachea normal and normal range of motion. Neck supple. Carotid bruit is not present. No mass and no thyromegaly present.  Cardiovascular: Normal rate, regular rhythm, S1 normal, S2 normal, normal heart sounds and intact distal pulses.  Exam reveals no gallop.   No murmur heard. Pulmonary/Chest: Effort normal and breath sounds normal. No respiratory distress. She has no wheezes. She has no rhonchi. She has no rales.  Abdominal: Soft. Normal appearance and bowel sounds are normal. She exhibits no distension, no fluid wave, no abdominal bruit and no mass. There is no hepatosplenomegaly. There is no tenderness. There is no rebound, no guarding and no CVA tenderness. No hernia.  Genitourinary: Vagina normal and uterus normal. No breast swelling, tenderness, discharge or bleeding. Pelvic exam was performed with patient prone. There is no rash, tenderness or lesion on the right labia. There is no rash, tenderness or lesion on the left labia. Uterus is not enlarged and not tender. Cervix exhibits motion tenderness. Cervix exhibits no discharge and no friability. Right adnexum displays no mass, no tenderness and no fullness. Left adnexum displays no mass, no tenderness and no fullness.  Lymphadenopathy:    She has no cervical adenopathy.    She has no axillary adenopathy.  Neurological: She is alert. She has normal strength. No cranial nerve deficit or sensory deficit.  Skin: Skin is warm, dry and intact. No rash noted.  Psychiatric: Her speech is normal and behavior is normal. Judgment normal. Her mood appears not  anxious. Cognition and  memory are normal. She does not exhibit a depressed mood.    Diabetic foot exam: Normal inspection No skin breakdown No calluses  Normal DP pulses Normal sensation to light tough and monofilament Nails normal        Assessment & Plan:  Complete Physical Exam: The patient's preventative maintenance and recommended screening tests for an annual wellness exam were reviewed in full today. Brought up to date unless services declined.  Counselled on the importance of diet, exercise, and its role in overall health and mortality. The patient's FH and SH was reviewed, including their home life, tobacco status, and drug and alcohol status.

## 2011-04-23 NOTE — Patient Instructions (Addendum)
For GERD: Increase prilosec to 40 mg daily.. X 4-6 weeks, tehn consider tapering off/down. If no improvement at all in 2 weeks call.. For [place] to try prescription. Increase fiber gradually in diet, as well as water for constipation.  Can use miralax for severe constipation. Let me know if not improving.  Stop cholesterol medication.Marland Kitchen Keep up good work with diet and lifestyle.  Stop by front desk to talk with Marion/Cynthia to set up DXA and mammo in 10/2011.

## 2011-04-23 NOTE — Assessment & Plan Note (Signed)
Well controlled. Continue current medication.  

## 2011-05-03 ENCOUNTER — Encounter: Payer: Self-pay | Admitting: *Deleted

## 2011-05-04 ENCOUNTER — Telehealth: Payer: Self-pay | Admitting: *Deleted

## 2011-05-04 NOTE — Telephone Encounter (Signed)
Opened in error

## 2011-05-07 NOTE — Progress Notes (Signed)
Addended byKerby Nora on: 05/07/2011 04:55 PM   Modules accepted: Orders

## 2011-05-11 NOTE — Progress Notes (Signed)
Addended byKerby Nora on: 05/11/2011 05:26 PM   Modules accepted: Orders

## 2011-05-19 ENCOUNTER — Other Ambulatory Visit: Payer: Self-pay | Admitting: Family Medicine

## 2011-05-19 ENCOUNTER — Encounter: Payer: Self-pay | Admitting: Family Medicine

## 2011-05-19 ENCOUNTER — Other Ambulatory Visit: Payer: BC Managed Care – PPO

## 2011-05-19 ENCOUNTER — Ambulatory Visit (INDEPENDENT_AMBULATORY_CARE_PROVIDER_SITE_OTHER): Payer: BC Managed Care – PPO | Admitting: Family Medicine

## 2011-05-19 VITALS — BP 110/78 | HR 58 | Temp 97.9°F | Ht 66.0 in | Wt 179.8 lb

## 2011-05-19 DIAGNOSIS — M722 Plantar fascial fibromatosis: Secondary | ICD-10-CM

## 2011-05-19 MED ORDER — GLUCOSE BLOOD VI STRP: ORAL_STRIP | Status: AC

## 2011-05-19 NOTE — Patient Instructions (Signed)
Excellent Over the Counter Orthotics: Hapad: available at [email or web address] SPENCO: Available at some sports stores or www.amazon.com  SHOES: Danskos, [place]rells, [place], [place] - good arch support, want minimal bendability Shoes: Birkenstock shoes, [employer] [place], [address], [place], [place]

## 2011-05-19 NOTE — Progress Notes (Signed)
60 year old female:  1 month h/p L heel pain.  The patient presents with a 1 month long history of heel pain. This is notable for worsening pain first thing in the morning when arising and standing after sitting.   Prior foot or ankle fractures: none Prior operations: none Orthotics or bracing: none Medications: none PT or home rehab: none Night splints: no Ice massage: no Ball massage: no  Metatarsal pain: no  The PMH, PSH, Social History, Family History, Medications, and allergies have been reviewed in [facility], and have been updated if relevant.  REVIEW OF SYSTEMS  GEN: No fevers, chills. Nontoxic. Primarily MSK c/o today. MSK: Detailed in the HPI GI: tolerating PO intake without difficulty Neuro: No numbness, parasthesias, or tingling associated. Otherwise the pertinent positives of the ROS are noted above.   GEN: Well-developed,well-nourished,in no acute distress; alert,appropriate and cooperative throughout examination HEENT: Normocephalic and atraumatic without obvious abnormalities. Ears, externally no deformities PULM: Breathing comfortably in no respiratory distress EXT: No clubbing, cyanosis, or edema PSYCH: Normally interactive. Cooperative during the interview. Pleasant. Friendly and conversant. Not anxious or depressed appearing. Normal, full affect.  Echymosis: no Edema: no ROM: full LE B Gait: heel toe, non-antalgic MT pain: no Callus pattern: none Lateral Mall: NT Medial Mall: NT Talus: NT Navicular: NT Calcaneous: NT Metatarsals: NT 5th MT: NT Phalanges: NT Achilles: NT Plantar Fascia: tender, medial along PF. Pain with forced dorsi Fat Pad: NT Peroneals: NT Post Tib: NT Great Toe: Nml motion Ant Drawer: neg Other foot breakdown: none Long arch: preserved Transverse arch: preserved Hindfoot breakdown: none Sensation: intact  A/P: Plantar fascitis: We reviewed that stretching is critically important to the treatment of PF. Reviewed footwear.  Rigid soles have been shown to help with PF. Reviewed rehab of stretching and calf raises.   Added arch binder, rehab program

## 2011-05-22 ENCOUNTER — Other Ambulatory Visit: Payer: Self-pay | Admitting: Family Medicine

## 2011-05-26 ENCOUNTER — Ambulatory Visit: Payer: Self-pay | Admitting: Family Medicine

## 2011-06-08 ENCOUNTER — Encounter: Payer: Self-pay | Admitting: Family Medicine

## 2011-06-08 DIAGNOSIS — M8580 Other specified disorders of bone density and structure, unspecified site: Secondary | ICD-10-CM | POA: Insufficient documentation

## 2011-07-15 ENCOUNTER — Other Ambulatory Visit (INDEPENDENT_AMBULATORY_CARE_PROVIDER_SITE_OTHER): Payer: BC Managed Care – PPO

## 2011-07-15 DIAGNOSIS — E785 Hyperlipidemia, unspecified: Secondary | ICD-10-CM

## 2011-07-15 LAB — LIPID PANEL: Cholesterol: 245 mg/dL — ABNORMAL HIGH (ref 0–200)

## 2011-07-20 ENCOUNTER — Telehealth: Payer: Self-pay | Admitting: Family Medicine

## 2011-07-20 DIAGNOSIS — E7800 Pure hypercholesterolemia, unspecified: Secondary | ICD-10-CM

## 2011-07-20 MED ORDER — ROSUVASTATIN CALCIUM 5 MG PO TABS: 5.0000 mg | ORAL_TABLET | Freq: Every day | ORAL | Status: DC

## 2011-07-20 NOTE — Telephone Encounter (Signed)
Sent in crestor 5 mg daily.  Have pt return for las in 3 months fasting.

## 2011-07-20 NOTE — Telephone Encounter (Signed)
Message copied by Excell Seltzer on Tue Jul 20, 2011  4:59 PM ------      Message from: Consuello Masse      Created: Tue Jul 20, 2011  4:25 PM       Patient is agreeable to starting the crestor 5mg  [facility]

## 2011-07-21 ENCOUNTER — Encounter: Payer: Self-pay | Admitting: *Deleted

## 2011-07-21 NOTE — Telephone Encounter (Signed)
Appt made and patient advised via letter

## 2011-09-16 ENCOUNTER — Encounter: Payer: Self-pay | Admitting: Family Medicine

## 2011-09-16 ENCOUNTER — Ambulatory Visit (INDEPENDENT_AMBULATORY_CARE_PROVIDER_SITE_OTHER)
Admission: RE | Admit: 2011-09-16 | Discharge: 2011-09-16 | Disposition: A | Discharge status: Home / Self Care | Payer: BC Managed Care – PPO | Source: Ambulatory Visit | Attending: Family Medicine | Admitting: Family Medicine

## 2011-09-16 ENCOUNTER — Ambulatory Visit (INDEPENDENT_AMBULATORY_CARE_PROVIDER_SITE_OTHER): Payer: BC Managed Care – PPO | Admitting: Family Medicine

## 2011-09-16 VITALS — BP 130/80 | HR 61 | Temp 98.0°F | Wt 181.5 lb

## 2011-09-16 DIAGNOSIS — M25559 Pain in unspecified hip: Secondary | ICD-10-CM

## 2011-09-16 DIAGNOSIS — M25552 Pain in left hip: Secondary | ICD-10-CM

## 2011-09-16 MED ORDER — MELOXICAM 15 MG PO TABS: 15.0000 mg | ORAL_TABLET | Freq: Every day | ORAL | Status: DC

## 2011-09-16 NOTE — Patient Instructions (Addendum)
Good to see you. Please take Meloxicam as needed (with food). Xray was negative (likely bursitis)   Calcium supplements have received some bad press lately, with questions that they may increase risk of heart attack or blood clots.  The risk is very low, however none of these risks occur with calcium in FOOD. Try to get most or all of your calcium from your food--aim for 1000 mg/day for women up to 50 and men up to 70 and 1200 mg/day for women over 50 and men over 70.  To figure out dietary calcium: 300 mg/day from all non dairy foods plus 300 mg per cup of milk, other dairy, or fortified juice.  Non dairy foods that contain calcium:  Kale, oranges, sardines, oatmeal, soy milk/soybeans, salmon, white beans, dried figs, turnip greens, almonds, broccoli, tofu.

## 2011-09-16 NOTE — Progress Notes (Signed)
Subjective:     Meghan Kerr is a 60 y.o. female who presents for evaluation of left hip pain. Patient has a 4 months history of pain, tenderness in the area of the left hip. The pain is sharp at times and is on the outside of the hip. There is no radiation down the leg. The patient has not had this type of problem before. The patient does exercise regularly. Symptoms may have been triggered by a fall down the stairs several months ago. Onset was gradual. The symptoms are moderate. Associated symptoms include: joint pain. Patient denies memory loss, nausea, new headache, nodules and polyuria. Aggravating factors: movement and standing. Alleviating factors: nothing. Previous treatments include OTC meds.  DEXA consistent with osteopenia in 04/2011.  Patient Active Problem List  Diagnoses  . DIABETES MELLITUS, TYPE II, WITHOUT COMPLICATIONS  . HYPERLIPIDEMIA  . ANXIETY DEPRESSION  . HYPERTENSION  . GERD  . BELL'S PALSY, HX OF  . Osteopenia  . Left hip pain   Past Medical History  Diagnosis Date  . Hyperlipidemia   . Hypertension    Past Surgical History  Procedure Date  . Ectopic pregnancy surgery 1976  . Trigger finger release 2006  . Wrist fracture surgery     broken left wrist repair with plate  . Appendectomy    History  Substance Use Topics  . Smoking status: Never Smoker   . Smokeless tobacco: Not on file  . Alcohol Use: No   Family History  Problem Relation Age of Onset  . Diabetes Mother   . Heart disease Mother     cad and chf  . Heart disease Father     heart attack (massive)  . Cancer Maternal Grandmother     COLON  . Stroke Maternal Grandfather   . Diabetes Brother   . Heart disease Brother 70    hert attack  . Heart disease Brother 32    CABG   . Stroke Sister   . Heart disease Sister     VALVE REPLACEMENT   Allergies  Allergen Reactions  . Amoxicillin     REACTION: rash  . Sulfonamide Derivatives     REACTION: Blisters on leg   Current  Outpatient Prescriptions on File Prior to Visit  Medication Sig Dispense Refill  . benazepril-hydrochlorthiazide (LOTENSIN HCT) 20-12.5 MG per tablet TAKE 1 TABLET BY MOUTH EVERY DAY  30 tablet  8  . Blood Glucose Monitoring Suppl (ONE TOUCH ULTRA MINI) W/DEVICE KIT by Does not apply route. Test daily or as directed       . fluticasone (FLONASE) 50 MCG/ACT nasal spray 2 sprays by Nasal route daily.        Marland Kitchen glucose blood (ONE TOUCH TEST STRIPS) test strip Use as instructed  100 each  12  . rosuvastatin (CRESTOR) 5 MG tablet Take 1 tablet (5 mg total) by mouth at bedtime.  30 tablet  11  . venlafaxine (EFFEXOR-XR) 37.5 MG 24 hr capsule TAKE 1 CAPSULE EVERY DAY BY MOUTH  30 capsule  5   The PMH, PSH, Social History, Family History, Medications, and allergies have been reviewed in [facility], and have been updated if relevant.   Review of Systems See HPI  Objective:  BP 130/80  Pulse 61  Temp(Src) 98 F (36.7 C) (Oral)  Wt 181 lb 8 oz (82.328 kg)   BP 130/80  Pulse 61  Temp(Src) 98 F (36.7 C) (Oral)  Wt 181 lb 8 oz (82.328 kg)  General Appearance:  Alert, cooperative, no distress, appears stated age  Head:    Normocephalic, without obvious abnormality, atraumatic  Eyes:    PERRL, conjunctiva/corneas clear, EOM's intact, fundi    benign, both eyes  Ears:    Normal TM's and external ear canals, both ears  Nose:   Nares normal, septum midline, mucosa normal, no drainage    or sinus tenderness  Throat:   Lips, mucosa, and tongue normal; teeth and gums normal  Neck:   Supple, symmetrical, trachea midline, no adenopathy;    thyroid:  no enlargement/tenderness/nodules; no carotid   bruit or JVD  Back:     Symmetric, no curvature, ROM normal, no CVA tenderness  Lungs:     Clear to auscultation bilaterally, respirations unlabored  Chest Wall:    No tenderness or deformity   Heart:    Regular rate and rhythm, S1 and S2 normal, no murmur, rub   or gallop  Breast Exam:    No tenderness, masses,  or nipple abnormality  Abdomen:     Soft, non-tender, bowel sounds active all four quadrants,    no masses, no organomegaly  Genitalia:    Normal female without lesion, discharge or tenderness  Rectal:    Normal tone, normal prostate, no masses or tenderness;   guaiac negative stool  Extremities:   Extremities normal, atraumatic, no cyanosis or edema, Mild TTP over left trochanteric bursa   Pulses:   2+ and symmetric all extremities  Skin:   Skin color, texture, turgor normal, no rashes or lesions  Lymph nodes:   Cervical, supraclavicular, and axillary nodes normal  Neurologic:   CNII-XII intact, normal strength, sensation and reflexes    throughout    Imaging X-rays: 2 views of the hip show no evidence of fracture or severe arthritic changes about the femoral head or acetabulum.    Assessment:    Left trochanteric bursitis    Plan:    1.  I explained the condition to the patient. 2.  All questions answered.  3.  We discussed the option of using a steroid injection to settle the inflammation 4.  I recommended regular icing for 20 min, three times per day.   5.  I offered the use of anti-inflammatories, RX for Mobic called int. 6.  I gave the patient a handout on this condition and instructed them on the exercises.  Increasing flexibility should help take the tension off of the iliotibial band as it crosses the greater trochanter.

## 2011-11-15 ENCOUNTER — Telehealth: Payer: Self-pay | Admitting: *Deleted

## 2011-11-15 ENCOUNTER — Ambulatory Visit: Payer: Self-pay | Admitting: Family Medicine

## 2011-11-15 NOTE — Telephone Encounter (Signed)
Pt is going to [place] at 5:30 today for a mammogram and she is asking if we need to do any paperwork prior to her visit.  Do we need to send order?

## 2011-11-15 NOTE — Telephone Encounter (Signed)
no

## 2011-11-16 ENCOUNTER — Encounter: Payer: Self-pay | Admitting: Family Medicine

## 2011-11-22 ENCOUNTER — Other Ambulatory Visit: Payer: BC Managed Care – PPO

## 2011-12-17 ENCOUNTER — Ambulatory Visit (INDEPENDENT_AMBULATORY_CARE_PROVIDER_SITE_OTHER): Payer: BC Managed Care – PPO | Admitting: Family Medicine

## 2011-12-17 ENCOUNTER — Encounter: Payer: Self-pay | Admitting: Family Medicine

## 2011-12-17 ENCOUNTER — Other Ambulatory Visit: Payer: Self-pay | Admitting: Family Medicine

## 2011-12-17 VITALS — BP 128/76 | HR 72 | Temp 98.3°F | Wt 188.0 lb

## 2011-12-17 DIAGNOSIS — J019 Acute sinusitis, unspecified: Secondary | ICD-10-CM | POA: Insufficient documentation

## 2011-12-17 MED ORDER — GUAIFENESIN-CODEINE 100-10 MG/5ML PO SYRP: 5.0000 mL | ORAL_SOLUTION | Freq: Every evening | ORAL | Status: AC | PRN

## 2011-12-17 MED ORDER — AZITHROMYCIN 250 MG PO TABS: ORAL_TABLET | ORAL | Status: AC

## 2011-12-17 NOTE — Patient Instructions (Signed)
You have a sinus infection. Take medicine as prescribed: zpack to hold on to in case fever >101, worsening productive cough or sxs past 10 days Push fluids and plenty of rest. Continue flonase. Nasal saline irrigation or neti pot to help drain sinuses. May use simple mucinex with plenty of fluid to help mobilize mucous. Let us know if fever >101.5, trouble opening/closing mouth, difficulty swallowing, or worsening - you may need to be seen again.

## 2011-12-17 NOTE — Progress Notes (Signed)
  Subjective:    Patient ID: Meghan Kerr, female    DOB: 1951/07/29, 60 y.o.   MRN: [id number]  HPI CC: sinus pressure  3d h/o sinus congestion, pressure, coughing.  trouble sleeping at night.  Dull HA, scratchy through.  Dry cough.  Worse pain with bending head over.  + post tussive gagging.  Left nostril obstructed.  So far has tried OTC [employer] brand daytime congestion meds.  Haven't really helped.  No fevers/chills, abd pain, n/v, rashes, ear pain or tooth pain, chest pain, SOB.  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  No smokers at home.  No h/o asthma, COPD.  No flu shot this year.  Td 2007 Review of Systems Per HPI    Objective:   Physical Exam  Nursing note and vitals reviewed. Constitutional: She appears well-developed and well-nourished. No distress.  HENT:  Head: Normocephalic and atraumatic.  Right Ear: Hearing, tympanic membrane, external ear and ear canal normal.  Left Ear: Hearing, tympanic membrane, external ear and ear canal normal.  Nose: Mucosal edema present. No rhinorrhea. Right sinus exhibits maxillary sinus tenderness. Right sinus exhibits no frontal sinus tenderness. Left sinus exhibits maxillary sinus tenderness. Left sinus exhibits no frontal sinus tenderness.  Mouth/Throat: Uvula is midline, oropharynx is clear and moist and mucous membranes are normal. No oropharyngeal exudate, posterior oropharyngeal edema, posterior oropharyngeal erythema or tonsillar abscesses.  Eyes: Conjunctivae and EOM are normal. Pupils are equal, round, and reactive to light. No scleral icterus.  Neck: Normal range of motion. Neck supple.  Cardiovascular: Normal rate, regular rhythm, normal heart sounds and intact distal pulses.   No murmur heard. Pulses:      Radial pulses are 2+ on the right side, and 2+ on the left side.  Pulmonary/Chest: Effort normal and breath sounds normal. No respiratory distress. She has no wheezes. She has no  rales.  Musculoskeletal: Normal range of motion.  Lymphadenopathy:    She has no cervical adenopathy.  Skin: Skin is warm and dry. No rash noted.  Psychiatric: She has a normal mood and affect. Her behavior is normal. Judgment and thought content normal.       Assessment & Plan:

## 2011-12-17 NOTE — Assessment & Plan Note (Signed)
Anticipate early sinusitis, discussed likely viral and supportive care with mucinex, fluid, rest and cheratussin for ocugh at night.  Also use saline and flonase. If not improving as expected or any worsening cough, or going on past 10 days, fill zpack.

## 2012-01-21 ENCOUNTER — Telehealth: Payer: Self-pay | Admitting: Family Medicine

## 2012-01-21 NOTE — Telephone Encounter (Signed)
Make sure pt has appt at [facility] or somewhere here today.

## 2012-01-21 NOTE — Telephone Encounter (Signed)
Triage Record Num: [id number] Operator: Lyn Hollingshead Patient Name: Meghan Kerr Call Date & Time: 01/20/2012 5:16:55PM Patient Phone: [phone] PCP: Kerby Nora Patient Gender: Female PCP Fax : [phone] Patient DOB: [birth year] Practice Name: [facility] Reason for Call: Caller: Lamyiah/Patient; PCP: Excell Seltzer.; CB#: [phone]; ; ; Call regarding Swelling/Knot in Leg; Onset- 01/19/12 Afebrile. Pt has an area in her leg near the calf that is tender to the touch. It is pink and warm. It is half dollar size. Emergent s/s of Edema Atraumatic r/o. Pt to see provider within 24hrs. Pt will call to schedule an appt in the morning. Protocol(s) Used: Edema, Atraumatic Recommended Outcome per Protocol: See Provider within 24 hours Reason for Outcome: Edema newly worse than usual pattern OR newly developed in last 12 hours Care Advice: ~ Call provider if symptoms worsen or new symptoms develop. Go to ED IMMEDIATELY if developing increased shortness of breath, continuous cough, worsening fatigue, or unable to perform ADLs. ~ ~ SYMPTOM / CONDITION MANAGEMENT ~ List, or take, all current prescription(s), nonprescription or alternative medication(s) to provider for evaluation. LEG CARE: - Avoid prolonged sitting or standing; take a break to move around every hour or so. - Keep legs raised when sitting, resting or sleeping; when possible raise legs above level of the heart for 20 -30 minutes. - Do not cross your legs. - Wear loose, non-restrictive clothing, especially around waist, groin area and legs. - Consider using support hose if recommended by your provider. ~ 01/20/2012 5:39:39PM Page 1 of 1 [email or web address]

## 2012-01-21 NOTE — Telephone Encounter (Signed)
Patient advised and will go to Saturday clinic

## 2012-02-14 ENCOUNTER — Other Ambulatory Visit: Payer: Self-pay | Admitting: Family Medicine

## 2012-03-09 ENCOUNTER — Other Ambulatory Visit: Payer: Self-pay | Admitting: Family Medicine

## 2012-03-14 ENCOUNTER — Other Ambulatory Visit: Payer: BC Managed Care – PPO

## 2012-03-14 ENCOUNTER — Encounter: Payer: BC Managed Care – PPO | Admitting: Vascular Surgery

## 2012-04-24 ENCOUNTER — Other Ambulatory Visit: Payer: Self-pay | Admitting: *Deleted

## 2012-04-24 MED ORDER — VENLAFAXINE HCL ER 37.5 MG PO CP24: 37.5000 mg | ORAL_CAPSULE | Freq: Every day | ORAL | Status: DC

## 2012-06-19 ENCOUNTER — Other Ambulatory Visit: Payer: Self-pay | Admitting: *Deleted

## 2012-06-19 MED ORDER — VENLAFAXINE HCL ER 37.5 MG PO CP24: 37.5000 mg | ORAL_CAPSULE | Freq: Every day | ORAL | Status: DC

## 2012-07-14 ENCOUNTER — Telehealth: Payer: Self-pay | Admitting: Family Medicine

## 2012-07-14 DIAGNOSIS — I10 Essential (primary) hypertension: Secondary | ICD-10-CM

## 2012-07-14 DIAGNOSIS — E785 Hyperlipidemia, unspecified: Secondary | ICD-10-CM

## 2012-07-14 DIAGNOSIS — M8580 Other specified disorders of bone density and structure, unspecified site: Secondary | ICD-10-CM

## 2012-07-14 DIAGNOSIS — E119 Type 2 diabetes mellitus without complications: Secondary | ICD-10-CM

## 2012-07-14 NOTE — Telephone Encounter (Signed)
Message copied by Excell Seltzer on Fri Jul 14, 2012 10:12 AM ------      Message from: [place], [place] J      Created: Wed Jul 12, 2012 11:49 AM      Regarding: Lab orders for Mon 07-17-12       Patient is scheduled for CPX labs, please order future labs, Thanks , Meghan Kerr

## 2012-07-17 ENCOUNTER — Other Ambulatory Visit (INDEPENDENT_AMBULATORY_CARE_PROVIDER_SITE_OTHER): Payer: BC Managed Care – PPO

## 2012-07-17 DIAGNOSIS — M8580 Other specified disorders of bone density and structure, unspecified site: Secondary | ICD-10-CM

## 2012-07-17 DIAGNOSIS — E119 Type 2 diabetes mellitus without complications: Secondary | ICD-10-CM

## 2012-07-17 DIAGNOSIS — E785 Hyperlipidemia, unspecified: Secondary | ICD-10-CM

## 2012-07-17 LAB — COMPREHENSIVE METABOLIC PANEL
AST: 17 U/L (ref 0–37)
Alkaline Phosphatase: 102 U/L (ref 39–117)
BUN: 16 mg/dL (ref 6–23)
Creatinine, Ser: 0.8 mg/dL (ref 0.4–1.2)
Glucose, Bld: 133 mg/dL — ABNORMAL HIGH (ref 70–99)
Total Bilirubin: 0.8 mg/dL (ref 0.3–1.2)

## 2012-07-17 LAB — LIPID PANEL
Cholesterol: 179 mg/dL (ref 0–200)
HDL: 58.1 mg/dL (ref >39.00)
LDL Cholesterol: 101 mg/dL — ABNORMAL HIGH (ref 0–99)
Total CHOL/HDL Ratio: 3
Triglycerides: 102 mg/dL (ref 0.0–149.0)

## 2012-07-20 ENCOUNTER — Encounter: Payer: BC Managed Care – PPO | Admitting: Family Medicine

## 2012-07-25 ENCOUNTER — Encounter: Payer: Self-pay | Admitting: Family Medicine

## 2012-07-25 ENCOUNTER — Ambulatory Visit (INDEPENDENT_AMBULATORY_CARE_PROVIDER_SITE_OTHER): Payer: BC Managed Care – PPO | Admitting: Family Medicine

## 2012-07-25 VITALS — BP 122/72 | HR 60 | Temp 98.3°F | Ht 65.0 in | Wt 187.0 lb

## 2012-07-25 DIAGNOSIS — E119 Type 2 diabetes mellitus without complications: Secondary | ICD-10-CM

## 2012-07-25 DIAGNOSIS — Z0000 Encounter for general adult medical examination without abnormal findings: Secondary | ICD-10-CM

## 2012-07-25 DIAGNOSIS — M949 Disorder of cartilage, unspecified: Secondary | ICD-10-CM

## 2012-07-25 DIAGNOSIS — Z1231 Encounter for screening mammogram for malignant neoplasm of breast: Secondary | ICD-10-CM

## 2012-07-25 DIAGNOSIS — M8580 Other specified disorders of bone density and structure, unspecified site: Secondary | ICD-10-CM

## 2012-07-25 DIAGNOSIS — F341 Dysthymic disorder: Secondary | ICD-10-CM

## 2012-07-25 DIAGNOSIS — M899 Disorder of bone, unspecified: Secondary | ICD-10-CM

## 2012-07-25 DIAGNOSIS — I10 Essential (primary) hypertension: Secondary | ICD-10-CM

## 2012-07-25 DIAGNOSIS — M25552 Pain in left hip: Secondary | ICD-10-CM

## 2012-07-25 DIAGNOSIS — E785 Hyperlipidemia, unspecified: Secondary | ICD-10-CM

## 2012-07-25 DIAGNOSIS — M25559 Pain in unspecified hip: Secondary | ICD-10-CM

## 2012-07-25 MED ORDER — TRAMADOL HCL 50 MG PO TABS: 50.0000 mg | ORAL_TABLET | Freq: Every day | ORAL | Status: DC | PRN

## 2012-07-25 MED ORDER — ERGOCALCIFEROL 1.25 MG (50000 UT) PO CAPS: 50000.0000 [IU] | ORAL_CAPSULE | ORAL | Status: DC

## 2012-07-25 NOTE — Patient Instructions (Addendum)
Replace vit D with prescription supplement and when complete start vit D OTC daily. Okay to continue crestor every other day. Work on exercise, weight loss and healthy eating Call if you need a referral to have colonoscopy. Stop by the front to schedule mammogram. Will try tramadol for  Left hip pain. Gentlr stretching.  Call or follow up if not improving.. Can see Dr. Patsy Lager, sports medicine, for further eval if needed.

## 2012-07-25 NOTE — Progress Notes (Signed)
Subjective:    Patient ID: Meghan Kerr, female    DOB: [birth year], 61 y.o.   MRN: [id number]  HPI  Diabetes: Well controlled  Lab Results  Component Value Date   HGBA1C 6.9* 07/17/2012  Using medications without difficulties:  Hypoglycemic episodes:None  Hyperglycemic episodes:None  Feet problems:None,  Except toenail fungus Blood Sugars averaging: Not checking  eye exam within last year: due   Hypertension: Well controlled  Using medication without problems or lightheadedness:  Chest pain with exertion: None  Edema: None  Short of breath: None  Average home BPs: not checking  Elevated Cholesterol: Well controlled on crestor 5 mg daily, using every other day. Lab Results  Component Value Date   CHOL 179 07/17/2012   HDL 58.10 07/17/2012   LDLCALC 101* 07/17/2012   LDLDIRECT 190.1 07/15/2011   TRIG 102.0 07/17/2012   CHOLHDL 3 07/17/2012   Using medications without problems.  Yes.. Some body ache, but netter on crestor every other day. Muscle aches: yes  No exercise, moderate diet. Other complaints:   GERD, much improved now.   Increase stress, cousin died few days ago, brother in hospice  Now. Mood moderately well controlled. She is using Effexor every other day. She is having some fatigue.  Left hi pain off and on in last few years. 2011 Xiray nml.  Pain is in lateral hipo and anterior.  Meloxicam does not help.  She is not interested in injections to treat. Hesitant about antinflammatories due to SE.   Review of Systems  Constitutional: Positive for fatigue. Negative for fever and unexpected weight change.  HENT: Negative for ear pain, congestion, sore throat, sneezing, trouble swallowing and sinus pressure.   Eyes: Negative for pain and itching.  Respiratory: Negative for cough, shortness of breath and wheezing.   Cardiovascular: Negative for chest pain, palpitations and leg swelling.  Gastrointestinal: Negative for nausea, abdominal pain, diarrhea, constipation  and blood in stool.  Genitourinary: Negative for dysuria, hematuria, vaginal discharge, difficulty urinating and menstrual problem.  Skin: Negative for rash.  Neurological: Negative for syncope, weakness, light-headedness, numbness and headaches.  Psychiatric/Behavioral: Negative for confusion and dysphoric mood. The patient is not nervous/anxious.        Objective:   Physical Exam  Constitutional: Vital signs are normal. She appears well-developed and well-nourished. She is cooperative.  Non-toxic appearance. She does not appear ill. No distress.  HENT:  Head: Normocephalic.  Right Ear: Hearing, tympanic membrane, external ear and ear canal normal.  Left Ear: Hearing, tympanic membrane, external ear and ear canal normal.  Nose: Nose normal.  Eyes: Conjunctivae, EOM and lids are normal. Pupils are equal, round, and reactive to light. No foreign bodies found.  Neck: Trachea normal and normal range of motion. Neck supple. Carotid bruit is not present. No mass and no thyromegaly present.  Cardiovascular: Normal rate, regular rhythm, S1 normal, S2 normal, normal heart sounds and intact distal pulses.  Exam reveals no gallop.   No murmur heard. Pulmonary/Chest: Effort normal and breath sounds normal. No respiratory distress. She has no wheezes. She has no rhonchi. She has no rales.  Abdominal: Soft. Normal appearance and bowel sounds are normal. She exhibits no distension, no fluid wave, no abdominal bruit and no mass. There is no hepatosplenomegaly. There is no tenderness. There is no rebound, no guarding and no CVA tenderness. No hernia.  Genitourinary: No breast swelling, tenderness, discharge or bleeding. Pelvic exam was performed with patient prone.       refused DVE this  year, pap not indicated this year  Lymphadenopathy:    She has no cervical adenopathy.    She has no axillary adenopathy.  Neurological: She is alert. She has normal strength. No cranial nerve deficit or sensory deficit.    Skin: Skin is warm, dry and intact. No rash noted.  Psychiatric: Her speech is normal and behavior is normal. Judgment normal. Her mood appears not anxious. Cognition and memory are normal. She does not exhibit a depressed mood.    Wt Readings from Last 3 Encounters:  07/25/12 187 lb (84.823 kg)  12/17/11 188 lb (85.276 kg)  09/16/11 181 lb 8 oz (82.328 kg)     Diabetic foot exam: Normal inspection No skin breakdown No calluses  Normal DP pulses Normal sensation to light touch and monofilament Nails normal     Assessment & Plan:  The patient's preventative maintenance and recommended screening tests for an annual wellness exam were reviewed in full today. Brought up to date unless services declined.  Counselled on the importance of diet, exercise, and its role in overall health and mortality. The patient's FH and SH was reviewed, including their home life, tobacco status, and drug and alcohol status.   Vaccines; Uptodate PNA and Td, declined zostavax 2012 DVE/pap:04/23/2011, on q3 year schedule.Marland Kitchen She wants to hold off on DVE this year. Asymptomatic. Colon: Date of Last Colonoscopy: 05/28/2007.. In [place] Results: Hyperplastic Polyp, recommended repeat in 5 years. Mammo:11/15/11 nml, due in 10/2012 DEXA: osteopenia in 04/2011

## 2012-07-25 NOTE — Assessment & Plan Note (Signed)
Well controlled. Continue current medication.  

## 2012-07-25 NOTE — Assessment & Plan Note (Signed)
Well controlled 

## 2012-07-25 NOTE — Assessment & Plan Note (Signed)
Possible hip bursitits but pt not interested in injection. Meloxicam not helpful. Will try tramadol for pain. Info and stretching given. Call or follow up if not improving.. Can see Dr. Patsy Lager sports medication for further eval if needed.

## 2012-07-25 NOTE — Assessment & Plan Note (Signed)
Vit D low.. Will replete.

## 2012-08-15 ENCOUNTER — Other Ambulatory Visit: Payer: Self-pay | Admitting: Family Medicine

## 2012-08-20 ENCOUNTER — Other Ambulatory Visit: Payer: Self-pay | Admitting: Family Medicine

## 2012-10-09 ENCOUNTER — Telehealth: Payer: Self-pay | Admitting: Family Medicine

## 2012-10-09 ENCOUNTER — Ambulatory Visit (INDEPENDENT_AMBULATORY_CARE_PROVIDER_SITE_OTHER): Payer: BC Managed Care – PPO | Admitting: Family Medicine

## 2012-10-09 ENCOUNTER — Encounter: Payer: Self-pay | Admitting: Family Medicine

## 2012-10-09 VITALS — BP 118/80 | HR 72 | Temp 98.2°F | Wt 186.5 lb

## 2012-10-09 DIAGNOSIS — J40 Bronchitis, not specified as acute or chronic: Secondary | ICD-10-CM | POA: Insufficient documentation

## 2012-10-09 MED ORDER — AZITHROMYCIN 250 MG PO TABS: ORAL_TABLET | ORAL | Status: DC

## 2012-10-09 MED ORDER — GUAIFENESIN-CODEINE 100-10 MG/5ML PO SYRP: 5.0000 mL | ORAL_SOLUTION | Freq: Two times a day (BID) | ORAL | Status: DC | PRN

## 2012-10-09 NOTE — Telephone Encounter (Signed)
Will see today.  

## 2012-10-09 NOTE — Telephone Encounter (Signed)
Caller: Meghan Kerr/Patient; Patient Name: Meghan Kerr; PCP: Kerby Nora [facility]); Best Callback Phone Number: [phone].  Patient states she missed call from office last week.  Sore Throat, Cold/ Cough  Symptoms, Sinus Pressure, Diarrhea and sore abdomen from coughing.  Afebrile/subjective.  Worst symptom is Cough.  Cough produces clear sputum and clear nasal secretions.  Relates she has to sleep on 2 pillows, cough worse with lying down.  Emergent symptoms ruled out. Taking [facility] Guaifenesin"  Home care and see provider in 24 hours per Cough protocol.  Appointment with Dr.  Sharen Hones at 10:15 per Cough protocol due to cough worsens when lying down and having to sleep on more than one pillow.

## 2012-10-09 NOTE — Assessment & Plan Note (Signed)
Acute bronchitis. Given progression, will cover with zpack. cheratussin for cough at night. update [place] if worsening. Pt agrees with plan.

## 2012-10-09 NOTE — Progress Notes (Signed)
  Subjective:    Patient ID: Meghan Kerr, female    DOB: [birth year], 61 y.o.   MRN: [id number]  HPI CC: feels ill  1 wk h/o gradual onset of feeling ill.  Body aches, chest congestion with coughing fits, sinus pressure.  + PNDrainage.  Cough productive of sputum but gets choked on it.  abd soreness from coughing.  Feels chest = head congestion.  Cough waking her up at night.  So far has tried dayquil, as well as chest congestion medicine.  No fevers/chills, HA, ear or tooth pain, abd pain, n/v, rashes.  No sick contacts at home.  No smokers at home. No h/o asthma.  + mild allergic rhinitis but always controlled on flonase. Tends to get yearly sinus infection.  Did not get flu shot this year yet.  Past Medical History  Diagnosis Date  . Hyperlipidemia   . Hypertension      Review of Systems Per HPI    Objective:   Physical Exam  Nursing note and vitals reviewed. Constitutional: She appears well-developed and well-nourished. No distress.  HENT:  Head: Normocephalic and atraumatic.  Right Ear: Hearing, tympanic membrane, external ear and ear canal normal.  Left Ear: Hearing, tympanic membrane, external ear and ear canal normal.  Nose: Nose normal. No mucosal edema or rhinorrhea. Right sinus exhibits no maxillary sinus tenderness and no frontal sinus tenderness. Left sinus exhibits no maxillary sinus tenderness and no frontal sinus tenderness.  Mouth/Throat: Uvula is midline, oropharynx is clear and moist and mucous membranes are normal. No oropharyngeal exudate, posterior oropharyngeal edema, posterior oropharyngeal erythema or tonsillar abscesses.  Eyes: Conjunctivae normal and EOM are normal. Pupils are equal, round, and reactive to light. No scleral icterus.  Neck: Normal range of motion. Neck supple.  Cardiovascular: Normal rate, regular rhythm, normal heart sounds and intact distal pulses.   No murmur heard. Pulmonary/Chest: Effort normal and breath sounds normal. No  respiratory distress. She has no wheezes. She has no rales.       Coughing fits  Lymphadenopathy:    She has no cervical adenopathy.  Skin: Skin is warm and dry. No rash noted.       Assessment & Plan:

## 2012-10-09 NOTE — Patient Instructions (Signed)
I do think you have bronchitis.  Continue guaifenesin with plenty of water.  Take zpack and use cheratussin for cough at night time. Watch for fever> 101, or worsening productive cough, or not improving into end of week. I hope you start feeling better.

## 2012-11-14 ENCOUNTER — Other Ambulatory Visit: Payer: Self-pay | Admitting: Family Medicine

## 2012-11-15 ENCOUNTER — Ambulatory Visit: Payer: Self-pay | Admitting: Family Medicine

## 2012-11-17 ENCOUNTER — Encounter: Payer: Self-pay | Admitting: Family Medicine

## 2012-12-04 ENCOUNTER — Telehealth: Payer: Self-pay | Admitting: Family Medicine

## 2012-12-04 NOTE — Telephone Encounter (Signed)
Patient Information:  Caller Name: Neita  Phone: [phone]  Patient: Meghan Kerr, Meghan Kerr  Gender: Female  DOB: [birth year]  Age: 61 Years  PCP: Kerby Nora (Family Practice)   Symptoms  Reason For Call & Symptoms: diarrhea  Reviewed Health History In EMR: Yes  Reviewed Medications In EMR: Yes  Reviewed Allergies In EMR: Yes  Reviewed Surgeries / Procedures: Yes  Date of Onset of Symptoms: 12/03/2012  Guideline(s) Used:  Diarrhea  Disposition Per Guideline:   Go to Office Now  Reason For Disposition Reached:   Age > 60 years and has had > 6 diarrhea stools in past 24 hours  Advice Given:  N/A  Office Follow Up:  Does the office need to follow up with this patient?: No  Instructions For The Office: N/A  Patient Refused Recommendation:  Patient Refused Appt, Patient Requests Appt At Later Date  refuses UC; requested appt in AM 12/05/12 krs/can  RN Note:  c/o nausea and diarrhea > 24 hours.  Afebrile.  Able to take some fluids; voiding qs.  Per protocol, advised being seen within 4 hours; patient declines UC or appt at this time; requests appt in AM 12/05/12.  Again refuses urgent care.  Per patient request, appt scheduled 0745 12/05/12 with Dr. Dayton Martes.

## 2012-12-05 ENCOUNTER — Ambulatory Visit: Payer: Self-pay | Admitting: Family Medicine

## 2012-12-05 NOTE — Telephone Encounter (Signed)
Pt felt better this morning, cancelled appt for today.

## 2013-01-06 ENCOUNTER — Other Ambulatory Visit: Payer: Self-pay | Admitting: Family Medicine

## 2013-01-18 ENCOUNTER — Telehealth: Payer: Self-pay | Admitting: Family Medicine

## 2013-01-18 ENCOUNTER — Other Ambulatory Visit (INDEPENDENT_AMBULATORY_CARE_PROVIDER_SITE_OTHER): Payer: BC Managed Care – PPO

## 2013-01-18 DIAGNOSIS — M8580 Other specified disorders of bone density and structure, unspecified site: Secondary | ICD-10-CM

## 2013-01-18 DIAGNOSIS — M949 Disorder of cartilage, unspecified: Secondary | ICD-10-CM

## 2013-01-18 DIAGNOSIS — E785 Hyperlipidemia, unspecified: Secondary | ICD-10-CM

## 2013-01-18 DIAGNOSIS — E119 Type 2 diabetes mellitus without complications: Secondary | ICD-10-CM

## 2013-01-18 DIAGNOSIS — M899 Disorder of bone, unspecified: Secondary | ICD-10-CM

## 2013-01-18 DIAGNOSIS — I10 Essential (primary) hypertension: Secondary | ICD-10-CM

## 2013-01-18 NOTE — Telephone Encounter (Signed)
Message copied by Kerby Nora E on Thu Jan 18, 2013  3:11 AM ------      Message from: [place] Lamy      Created: Wed Jan 10, 2013 11:25 AM      Regarding: 6 mo f/u labs Thurs 1/23       Please order  future f/u labs for pt's upcomming lab appt.      Thanks      Rodney Booze

## 2013-01-19 ENCOUNTER — Other Ambulatory Visit: Payer: Self-pay | Admitting: *Deleted

## 2013-01-19 LAB — VITAMIN D 25 HYDROXY (VIT D DEFICIENCY, FRACTURES): Vit D, 25-Hydroxy: 32 ng/mL (ref 30–89)

## 2013-01-19 MED ORDER — BENAZEPRIL-HYDROCHLOROTHIAZIDE 20-12.5 MG PO TABS: 1.0000 | ORAL_TABLET | Freq: Every day | ORAL | Status: DC

## 2013-01-25 ENCOUNTER — Ambulatory Visit: Payer: BC Managed Care – PPO | Admitting: Family Medicine

## 2013-01-30 ENCOUNTER — Encounter: Payer: Self-pay | Admitting: Family Medicine

## 2013-01-30 ENCOUNTER — Ambulatory Visit (INDEPENDENT_AMBULATORY_CARE_PROVIDER_SITE_OTHER): Payer: BC Managed Care – PPO | Admitting: Family Medicine

## 2013-01-30 VITALS — BP 120/72 | HR 66 | Temp 97.7°F | Ht 65.0 in | Wt 183.8 lb

## 2013-01-30 DIAGNOSIS — M899 Disorder of bone, unspecified: Secondary | ICD-10-CM

## 2013-01-30 DIAGNOSIS — M25559 Pain in unspecified hip: Secondary | ICD-10-CM

## 2013-01-30 DIAGNOSIS — M25552 Pain in left hip: Secondary | ICD-10-CM

## 2013-01-30 DIAGNOSIS — E785 Hyperlipidemia, unspecified: Secondary | ICD-10-CM

## 2013-01-30 DIAGNOSIS — I10 Essential (primary) hypertension: Secondary | ICD-10-CM

## 2013-01-30 DIAGNOSIS — E119 Type 2 diabetes mellitus without complications: Secondary | ICD-10-CM

## 2013-01-30 DIAGNOSIS — M8580 Other specified disorders of bone density and structure, unspecified site: Secondary | ICD-10-CM

## 2013-01-30 NOTE — Assessment & Plan Note (Signed)
Vit D def resolved.

## 2013-01-30 NOTE — Assessment & Plan Note (Signed)
Will eval next OV. Previously at goal.

## 2013-01-30 NOTE — Assessment & Plan Note (Signed)
Stable on tramadol. 

## 2013-01-30 NOTE — Patient Instructions (Addendum)
Don't forget to get a yearly eye exam. Can try urea ointment for softening feet... kerasol.

## 2013-01-30 NOTE — Assessment & Plan Note (Signed)
At goal on current regimen. Encouraged exercise, weight loss, healthy eating habits.

## 2013-01-30 NOTE — Assessment & Plan Note (Signed)
Well controlled. Continue current medication.  

## 2013-01-30 NOTE — Progress Notes (Signed)
Subjective:    Patient ID: Meghan Kerr, female    DOB: 1951/09/13, 62 y.o.   MRN: [id number]  HPI  Diabetes: Well controlled with diet. Lab Results  Component Value Date   HGBA1C 6.9* 01/18/2013  Using medications without difficulties:  Hypoglycemic episodes:None  Hyperglycemic episodes:None  Feet problems:None, Except toenail fungus  Blood Sugars averaging: Not checking  eye exam within last year: due   Hypertension: Well controlled  Using medication without problems or lightheadedness:  Chest pain with exertion: None  Edema: None  Short of breath: None  Average home BPs: not checking   Elevated Cholesterol: Well controlled  At last check on crestor 5 mg daily, using every other day.  Lab Results  Component Value Date   CHOL 179 07/17/2012   HDL 58.10 07/17/2012   LDLCALC 101* 07/17/2012   LDLDIRECT 190.1 07/15/2011   TRIG 102.0 07/17/2012   CHOLHDL 3 07/17/2012  Using medications without problems. Yes.. Some body ache, but netter on crestor every other day.  Muscle aches: yes  No exercise, moderate diet.  Other complaints:   GERD, much improved now.   At last OV: Left hip pain off and on in last few years. 2011 X-ray nml.  Pain is in lateral hip and anterior.  Meloxicam did not help. Tramadol 1/2  as needed controls pain fairly well. She is not interested in injections to treat. Hesitant about antinflammatories due to SE.    Vit D in nml range.    Review of Systems  Constitutional: Negative for fever and fatigue.  HENT: Negative for ear pain.   Eyes: Negative for pain.  Respiratory: Negative for chest tightness and shortness of breath.   Cardiovascular: Negative for chest pain, palpitations and leg swelling.  Gastrointestinal: Negative for abdominal pain.  Genitourinary: Negative for dysuria.       Objective:   Physical Exam  Constitutional: Vital signs are normal. She appears well-developed and well-nourished. She is cooperative.  Non-toxic appearance.  She does not appear ill. No distress.  HENT:  Head: Normocephalic.  Right Ear: Hearing, tympanic membrane, external ear and ear canal normal. Tympanic membrane is not erythematous, not retracted and not bulging.  Left Ear: Hearing, tympanic membrane, external ear and ear canal normal. Tympanic membrane is not erythematous, not retracted and not bulging.  Nose: No mucosal edema or rhinorrhea. Right sinus exhibits no maxillary sinus tenderness and no frontal sinus tenderness. Left sinus exhibits no maxillary sinus tenderness and no frontal sinus tenderness.  Mouth/Throat: Uvula is midline, oropharynx is clear and moist and mucous membranes are normal.  Eyes: Conjunctivae normal, EOM and lids are normal. Pupils are equal, round, and reactive to light. No foreign bodies found.  Neck: Trachea normal and normal range of motion. Neck supple. Carotid bruit is not present. No mass and no thyromegaly present.  Cardiovascular: Normal rate, regular rhythm, S1 normal, S2 normal, normal heart sounds, intact distal pulses and normal pulses.  Exam reveals no gallop and no friction rub.   No murmur heard. Pulmonary/Chest: Effort normal and breath sounds normal. Not tachypneic. No respiratory distress. She has no decreased breath sounds. She has no wheezes. She has no rhonchi. She has no rales.  Abdominal: Soft. Normal appearance and bowel sounds are normal. There is no tenderness.  Neurological: She is alert.  Skin: Skin is warm, dry and intact. No rash noted.  Psychiatric: Her speech is normal and behavior is normal. Judgment and thought content normal. Her mood appears not anxious. Cognition and memory  are normal. She does not exhibit a depressed mood.    Diabetic foot exam: Normal inspection No skin breakdown Calluses  On pads of feet B, no ulcers. Normal DP pulses Normal sensation to light touch and monofilament Nails normal       Assessment & Plan:

## 2013-03-12 ENCOUNTER — Other Ambulatory Visit: Payer: Self-pay | Admitting: Family Medicine

## 2013-03-22 ENCOUNTER — Ambulatory Visit (INDEPENDENT_AMBULATORY_CARE_PROVIDER_SITE_OTHER): Payer: BC Managed Care – PPO | Admitting: Family Medicine

## 2013-03-22 VITALS — BP 120/70 | HR 70 | Temp 97.5°F | Ht 65.0 in | Wt 184.0 lb

## 2013-03-22 DIAGNOSIS — L259 Unspecified contact dermatitis, unspecified cause: Secondary | ICD-10-CM

## 2013-03-22 DIAGNOSIS — L989 Disorder of the skin and subcutaneous tissue, unspecified: Secondary | ICD-10-CM

## 2013-03-22 MED ORDER — TRIAMCINOLONE ACETONIDE 0.5 % EX CREA: TOPICAL_CREAM | Freq: Two times a day (BID) | CUTANEOUS | Status: DC

## 2013-03-22 NOTE — Assessment & Plan Note (Signed)
Treat with topical steroid x 2 weeks.  

## 2013-03-22 NOTE — Assessment & Plan Note (Signed)
1. Nodule near nasal bridge and left eye: consistent with sebacous cyst small or cholesterol deposit... Use steroid topical to help with erythema, if still irritated given location..eval at derm  2. Erythematous mark on  Central nose where she picked off a pimple 1 year ago.: may be old scab but concerning for AK, SSCa given location... Consider eval at derm.

## 2013-03-22 NOTE — Progress Notes (Signed)
  Subjective:    Patient ID: Meghan Kerr, female    DOB: 1951-04-29, 62 y.o.   MRN: [id number]  Rash This is a new problem. The current episode started 1 to 4 weeks ago (1 month ago). The affected locations include the face and right arm (no rash in mouth or palms, rash is mainly on chin, additional red bump in left medial eye.). The rash is characterized by redness, dryness and itchiness (mildly tingly). She was exposed to nothing (does clean with harsh chemicals). Pertinent negatives include no congestion, cough, eye pain, facial edema, fatigue, fever, joint pain, shortness of breath or vomiting. (Sinus drainage) Past treatments include topical steroids. The treatment provided significant relief.      Review of Systems  Constitutional: Negative for fever and fatigue.  HENT: Negative for congestion.   Eyes: Negative for pain.  Respiratory: Negative for cough and shortness of breath.   Gastrointestinal: Negative for vomiting.  Musculoskeletal: Negative for joint pain.  Skin: Positive for rash.       Objective:   Physical Exam  Constitutional: She is oriented to person, place, and time. She appears well-developed and well-nourished.  HENT:  Head: Normocephalic.  Nose: Nose normal.  Mouth/Throat: Oropharynx is clear and moist.  Eyes: Conjunctivae are normal. Pupils are equal, round, and reactive to light.  Neck: Normal range of motion. Neck supple.  Cardiovascular: Normal rate.   Pulmonary/Chest: Effort normal and breath sounds normal.  Neurological: She is alert and oriented to person, place, and time.  Skin: Skin is warm and dry. Rash noted.  Small erythematous papules on chin, no vesicles and pustules Small nodule with surrounding erythema on nose where eye glasses rub Erythematous macule on central nose, no flaking          Assessment & Plan:

## 2013-03-22 NOTE — Patient Instructions (Addendum)
Apply steroid cream to affected areas twice daily x 2 weeks if not improving keep appt with skin care center.

## 2013-04-02 ENCOUNTER — Telehealth: Payer: Self-pay

## 2013-04-02 NOTE — Telephone Encounter (Signed)
Pt left v/m pt feeling listless, tired and muscles feel tired; pt wants to know if could try taking benzapril-HCTZ 1/2 tab daily to see if symptoms improve.Please advise.

## 2013-04-03 NOTE — Telephone Encounter (Signed)
Okay to decrease to 1/2 tab daily... Follow BP at home closely.. If BP >140/90  X 3 measurements.. Have her call. Uptodate med list.

## 2013-04-03 NOTE — Telephone Encounter (Signed)
Left message for patient to return my call.

## 2013-04-03 NOTE — Telephone Encounter (Signed)
Patient advised.

## 2013-05-22 ENCOUNTER — Encounter: Payer: Self-pay | Admitting: Family Medicine

## 2013-05-22 ENCOUNTER — Ambulatory Visit (INDEPENDENT_AMBULATORY_CARE_PROVIDER_SITE_OTHER): Payer: BC Managed Care – PPO | Admitting: Family Medicine

## 2013-05-22 ENCOUNTER — Telehealth: Payer: Self-pay | Admitting: Family Medicine

## 2013-05-22 VITALS — BP 118/68 | HR 60 | Temp 98.6°F | Wt 184.0 lb

## 2013-05-22 DIAGNOSIS — M79609 Pain in unspecified limb: Secondary | ICD-10-CM

## 2013-05-22 DIAGNOSIS — E785 Hyperlipidemia, unspecified: Secondary | ICD-10-CM

## 2013-05-22 DIAGNOSIS — M79604 Pain in right leg: Secondary | ICD-10-CM

## 2013-05-22 DIAGNOSIS — J069 Acute upper respiratory infection, unspecified: Secondary | ICD-10-CM

## 2013-05-22 DIAGNOSIS — M79605 Pain in left leg: Secondary | ICD-10-CM

## 2013-05-22 DIAGNOSIS — I10 Essential (primary) hypertension: Secondary | ICD-10-CM

## 2013-05-22 MED ORDER — TRIAMCINOLONE ACETONIDE 0.5 % EX CREA: TOPICAL_CREAM | Freq: Two times a day (BID) | CUTANEOUS | Status: DC

## 2013-05-22 NOTE — Assessment & Plan Note (Signed)
Symptomatic care 

## 2013-05-22 NOTE — Assessment & Plan Note (Signed)
Well controlled on 1/2 /1 tab every other day... Will try lower dose of benazapril HCTZ.

## 2013-05-22 NOTE — Progress Notes (Signed)
  Subjective:    Patient ID: Meghan Kerr, female    DOB: 1951/08/01, 62 y.o.   MRN: [id number]  Sinusitis This is a new problem. The current episode started in the past 7 days (3 days). The problem has been gradually worsening since onset. There has been no fever. The pain is mild. Associated symptoms include congestion, coughing and sinus pressure. Pertinent negatives include no ear pain, hoarse voice, shortness of breath, sneezing, sore throat or swollen glands. (Fatigue, body aches, sinus congestion, sinus pressure, ear pressure.) Treatments tried: cold and flu, nasal steroid spray. The treatment provided mild relief.      Continued body ache, leg muscle pain on crestor 5 mg EOD.  Ongoing for several years. She noted pain in right hip, B CMC joints... But also diffuse ttp in B calves and thighs.  BP very well controlled on lotensin 1/2  then 1 tab EOD aily...she wants to try 1/2 tab daily.       Review of Systems  Constitutional: Positive for fatigue. Negative for fever.  HENT: Positive for congestion and sinus pressure. Negative for ear pain, sore throat, hoarse voice and sneezing.   Eyes: Negative for pain.  Respiratory: Positive for cough. Negative for shortness of breath.   Cardiovascular: Negative for chest pain.  Gastrointestinal: Negative for abdominal pain.       Objective:   Physical Exam  Constitutional: Vital signs are normal. She appears well-developed and well-nourished. She is cooperative.  Non-toxic appearance. She does not appear ill. No distress.  HENT:  Head: Normocephalic.  Right Ear: Hearing, tympanic membrane, external ear and ear canal normal. Tympanic membrane is not erythematous, not retracted and not bulging.  Left Ear: Hearing, tympanic membrane, external ear and ear canal normal. Tympanic membrane is not erythematous, not retracted and not bulging.  Nose: Mucosal edema and rhinorrhea present. Right sinus exhibits no maxillary sinus tenderness and no  frontal sinus tenderness. Left sinus exhibits no maxillary sinus tenderness and no frontal sinus tenderness.  Mouth/Throat: Uvula is midline, oropharynx is clear and moist and mucous membranes are normal. No oropharyngeal exudate, posterior oropharyngeal edema, posterior oropharyngeal erythema or tonsillar abscesses.  Eyes: Conjunctivae, EOM and lids are normal. Pupils are equal, round, and reactive to light. No foreign bodies found.  Neck: Trachea normal and normal range of motion. Neck supple. Carotid bruit is not present. No mass and no thyromegaly present.  Cardiovascular: Normal rate, regular rhythm, S1 normal, S2 normal, normal heart sounds, intact distal pulses and normal pulses.  Exam reveals no gallop and no friction rub.   No murmur heard. Pulmonary/Chest: Effort normal and breath sounds normal. Not tachypneic. No respiratory distress. She has no decreased breath sounds. She has no wheezes. She has no rhonchi. She has no rales.  Abdominal: Soft. Normal appearance and bowel sounds are normal. There is no tenderness.  Neurological: She is alert.  Skin: Skin is warm, dry and intact. No rash noted.  Psychiatric: Her speech is normal and behavior is normal. Judgment and thought content normal. Her mood appears not anxious. Cognition and memory are normal. She does not exhibit a depressed mood.          Assessment & Plan:

## 2013-05-22 NOTE — Assessment & Plan Note (Signed)
Trial off crestor. If better... Will try trial of red yeast rice vs welchol.

## 2013-05-22 NOTE — Telephone Encounter (Signed)
Patient Information:  Caller Name: Jolanda  Phone: [phone]  Patient: Meghan Kerr, Meghan Kerr  Gender: Female  DOB: 10-07-1951  Age: 62 Years  PCP: Kerby Nora (Family Practice)  Office Follow Up:  Does the office need to follow up with this patient?: Yes  Instructions For The Office: Appts are full, pt did not want to see PA at [facility] or [place] location.  Requesting if Dr Ermalene Searing with call in antibiotics or other recommendations.   Symptoms  Reason For Call & Symptoms: Onset 05/20/13 itchy throat and raw, runny nose, pt thought allergies were acting up.  05/21/13 Nasal congestion, cough with yellow/green sputum, pain at sinus areas. Afebrile.  Treating with Cold and Flu, Flonase, hot shower, helps some with pain, but pain still persists.  Reviewed Health History In EMR: Yes  Reviewed Medications In EMR: Yes  Reviewed Allergies In EMR: Yes  Reviewed Surgeries / Procedures: Yes  Date of Onset of Symptoms: 05/20/2013  Treatments Tried: Cold and Flu, Flonase, hot shower  Treatments Tried Worked: Yes  Guideline(s) Used:  Cough  Disposition Per Guideline:   See Today in Office  Reason For Disposition Reached:   Sinus pain persists after using nasal washes and pain medicine > 24 hours  Advice Given:  Call Back If:  Difficulty breathing  You become worse.  Patient Refused Recommendation:  Patient Requests Prescription  Appts are full, pt did not want to see PA at [facility] or [place] location.  Requesting if Dr Ermalene Searing with call in antibiotics or other recommendations.

## 2013-05-22 NOTE — Telephone Encounter (Signed)
Advised patient , said she will be here at 4:00.  Advised Dr Ermalene Searing.

## 2013-05-22 NOTE — Telephone Encounter (Signed)
Call and add on at 4 OM. Let me know if she is unable to make it

## 2013-05-22 NOTE — Assessment & Plan Note (Signed)
Re-eval in 06/2013.

## 2013-05-22 NOTE — Patient Instructions (Addendum)
Stop crestor for 1-2 week you feel better.. Call to let me know.  In that case we will switch to red yeast rice. Decrease benazapril to 1/2 tab daily. Follow BP closely... Increase back up if BP >140/90.   Start mucinex, nasal saline spray 2-3 daily. Call to let me know if not turning the corner in 5-7 days.

## 2013-06-21 ENCOUNTER — Other Ambulatory Visit: Payer: Self-pay | Admitting: Family Medicine

## 2013-08-07 ENCOUNTER — Other Ambulatory Visit: Payer: Self-pay | Admitting: Family Medicine

## 2013-08-07 NOTE — Telephone Encounter (Signed)
Received refill request electronically. Last office visit 05/22/13. Is it okay to refill?

## 2013-08-10 ENCOUNTER — Other Ambulatory Visit: Payer: Self-pay | Admitting: Family Medicine

## 2013-08-10 NOTE — Telephone Encounter (Signed)
Rx called in as prescribed 

## 2013-08-10 NOTE — Telephone Encounter (Signed)
Electronic refill request, Dr. Ermalene Searing and Dr. Patsy Lager both not in office, please advise

## 2013-08-10 NOTE — Telephone Encounter (Signed)
Will refill times one in absence of PCP Px written for call in

## 2013-09-11 LAB — HM DIABETES EYE EXAM

## 2013-09-14 ENCOUNTER — Other Ambulatory Visit: Payer: Self-pay | Admitting: Family Medicine

## 2013-11-01 ENCOUNTER — Other Ambulatory Visit: Payer: Self-pay

## 2014-01-19 ENCOUNTER — Other Ambulatory Visit: Payer: Self-pay | Admitting: Family Medicine

## 2014-03-01 ENCOUNTER — Telehealth: Payer: Self-pay | Admitting: Family Medicine

## 2014-03-01 ENCOUNTER — Other Ambulatory Visit (INDEPENDENT_AMBULATORY_CARE_PROVIDER_SITE_OTHER): Payer: BC Managed Care – PPO

## 2014-03-01 DIAGNOSIS — F341 Dysthymic disorder: Secondary | ICD-10-CM

## 2014-03-01 DIAGNOSIS — E119 Type 2 diabetes mellitus without complications: Secondary | ICD-10-CM

## 2014-03-01 LAB — COMPREHENSIVE METABOLIC PANEL
ALBUMIN: 3.6 g/dL (ref 3.5–5.2)
ALT: 13 U/L (ref 0–35)
AST: 16 U/L (ref 0–37)
Alkaline Phosphatase: 78 U/L (ref 39–117)
BILIRUBIN TOTAL: 0.6 mg/dL (ref 0.3–1.2)
BUN: 15 mg/dL (ref 6–23)
CO2: 29 mEq/L (ref 19–32)
Calcium: 9.3 mg/dL (ref 8.4–10.5)
Chloride: 106 mEq/L (ref 96–112)
Creatinine, Ser: 0.7 mg/dL (ref 0.4–1.2)
GFR: 85.16 mL/min (ref >60.00)
GLUCOSE: 108 mg/dL — AB (ref 70–99)
Potassium: 3.7 mEq/L (ref 3.5–5.1)
Sodium: 142 mEq/L (ref 135–145)
Total Protein: 6.3 g/dL (ref 6.0–8.3)

## 2014-03-01 LAB — LIPID PANEL
Cholesterol: 159 mg/dL (ref 0–200)
HDL: 57.8 mg/dL (ref >39.00)
LDL CALC: 83 mg/dL (ref 0–99)
TRIGLYCERIDES: 90 mg/dL (ref 0.0–149.0)
Total CHOL/HDL Ratio: 3
VLDL: 18 mg/dL (ref 0.0–40.0)

## 2014-03-01 LAB — HEMOGLOBIN A1C: HEMOGLOBIN A1C: 7 % — AB (ref 4.6–6.5)

## 2014-03-01 NOTE — Telephone Encounter (Signed)
Message copied by Jinny Sanders on Fri Mar 01, 2014  9:53 AM ------      Message from: Ellamae Sia      Created: Wed Feb 27, 2014  6:30 PM      Regarding: Lab orders for Friday, 3.6.15       Lab orders for a f/u appt ------

## 2014-03-04 ENCOUNTER — Telehealth: Payer: Self-pay

## 2014-03-04 NOTE — Telephone Encounter (Signed)
Relevant patient education assigned to patient using Emmi. ° °

## 2014-03-08 ENCOUNTER — Encounter: Payer: Self-pay | Admitting: Family Medicine

## 2014-03-08 ENCOUNTER — Ambulatory Visit (INDEPENDENT_AMBULATORY_CARE_PROVIDER_SITE_OTHER): Payer: BC Managed Care – PPO | Admitting: Family Medicine

## 2014-03-08 VITALS — BP 140/80 | HR 61 | Temp 97.8°F | Ht 65.0 in | Wt 186.2 lb

## 2014-03-08 DIAGNOSIS — E785 Hyperlipidemia, unspecified: Secondary | ICD-10-CM

## 2014-03-08 DIAGNOSIS — I10 Essential (primary) hypertension: Secondary | ICD-10-CM

## 2014-03-08 DIAGNOSIS — E119 Type 2 diabetes mellitus without complications: Secondary | ICD-10-CM

## 2014-03-08 NOTE — Progress Notes (Signed)
63 year old female presents for follow up DMa nd HTN  Diabetes: Well controlled with diet.  Slight trend upwards Lab Results  Component Value Date   HGBA1C 7.0* 03/01/2014  Using medications without difficulties:  Hypoglycemic episodes:None  Hyperglycemic episodes:None  Feet problems:None, Except toenail fungus  Blood Sugars averaging: Not checking  eye exam within last year:  yes  Hypertension: Well controlled previously, slightly up above goal < 130/80 today BP Readings from Last 3 Encounters:  03/08/14 140/80  05/22/13 118/68  03/22/13 120/70  Using medication without problems or lightheadedness: None Chest pain with exertion: None  Edema: None  Short of breath: None  Average home BPs: not checking   Wt Readings from Last 3 Encounters:  03/08/14 186 lb 4 oz (84.482 kg)  05/22/13 184 lb (83.462 kg)  03/22/13 184 lb (83.462 kg)     Elevated Cholesterol: Well controlled At last check on crestor 5 mg daily, using every other day.  Lab Results  Component Value Date   CHOL 159 03/01/2014   HDL 57.80 03/01/2014   LDLCALC 83 03/01/2014   LDLDIRECT 190.1 07/15/2011   TRIG 90.0 03/01/2014   CHOLHDL 3 03/01/2014                                        Using medications without problems. Yes.. Some body ache, but better on crestor every other day.  Muscle aches: yes  No exercise, moderate diet.  Other complaints:   Review of Systems  Constitutional: Negative for fever and fatigue.  HENT: Negative for ear pain.  Eyes: Negative for pain.  Respiratory: Negative for chest tightness and shortness of breath.  Cardiovascular: Negative for chest pain, palpitations and leg swelling.  Gastrointestinal: Negative for abdominal pain.  Genitourinary: Negative for dysuria.  Objective:   Physical Exam  Constitutional: Vital signs are normal. She appears well-developed and well-nourished. She is cooperative. Non-toxic appearance. She does not appear ill. No distress.  HENT:  Head:  Normocephalic.  Right Ear: Hearing, tympanic membrane, external ear and ear canal normal. Tympanic membrane is not erythematous, not retracted and not bulging.  Left Ear: Hearing, tympanic membrane, external ear and ear canal normal. Tympanic membrane is not erythematous, not retracted and not bulging.  Nose: No mucosal edema or rhinorrhea. Right sinus exhibits no maxillary sinus tenderness and no frontal sinus tenderness. Left sinus exhibits no maxillary sinus tenderness and no frontal sinus tenderness.  Mouth/Throat: Uvula is midline, oropharynx is clear and moist and mucous membranes are normal.  Eyes: Conjunctivae normal, EOM and lids are normal. Pupils are equal, round, and reactive to light. No foreign bodies found.  Neck: Trachea normal and normal range of motion. Neck supple. Carotid bruit is not present. No mass and no thyromegaly present.  Cardiovascular: Normal rate, regular rhythm, S1 normal, S2 normal, normal heart sounds, intact distal pulses and normal pulses. Exam reveals no gallop and no friction rub.  No murmur heard.  Pulmonary/Chest: Effort normal and breath sounds normal. Not tachypneic. No respiratory distress. She has no decreased breath sounds. She has no wheezes. She has no rhonchi. She has no rales.  Abdominal: Soft. Normal appearance and bowel sounds are normal. There is no tenderness.  Neurological: She is alert.  Skin: Skin is warm, dry and intact. No rash noted.  Psychiatric: Her speech is normal and behavior is normal. Judgment and thought content normal. Her mood appears  not anxious. Cognition and memory are normal. She does not exhibit a depressed mood.   Diabetic foot exam:  Normal inspection  No skin breakdown  Calluses On pads of feet B, no ulcers.  Normal DP pulses  Normal sensation to light touch and monofilament  Nails normal

## 2014-03-08 NOTE — Assessment & Plan Note (Signed)
SLight worsening of A1C trending up... Discussed wiehgt loss, exercsie and healthy low carb diet.  If not decreasing at next OV.. Start med.

## 2014-03-08 NOTE — Progress Notes (Signed)
Pre visit review using our clinic review tool, if applicable. No additional management support is needed unless otherwise documented below in the visit note. 

## 2014-03-08 NOTE — Assessment & Plan Note (Signed)
Above goal on benazapril HCTZ.. Continue to follow

## 2014-03-08 NOTE — Assessment & Plan Note (Signed)
At goal on crestor every other day.

## 2014-03-08 NOTE — Patient Instructions (Addendum)
Work on low carbohydrate diet. Start regular exercise.. Walking etc. Decrease sweet tea. Change to water. Follow up in 3 months with fasting labs prior for DM. Schedule wellness visit in  6 months with labs prior.   Diabetes Meal Planning Guide The diabetes meal planning guide is a tool to help you plan your meals and snacks. It is important for people with diabetes to manage their blood glucose (sugar) levels. Choosing the right foods and the right amounts throughout your day will help control your blood glucose. Eating right can even help you improve your blood pressure and reach or maintain a healthy weight. CARBOHYDRATE COUNTING MADE EASY When you eat carbohydrates, they turn to sugar. This raises your blood glucose level. Counting carbohydrates can help you control this level so you feel better. When you plan your meals by counting carbohydrates, you can have more flexibility in what you eat and balance your medicine with your food intake. Carbohydrate counting simply means adding up the total amount of carbohydrate grams in your meals and snacks. Try to eat about the same amount at each meal. Foods with carbohydrates are listed below. Each portion below is 1 carbohydrate serving or 15 grams of carbohydrates. Ask your dietician how many grams of carbohydrates you should eat at each meal or snack. Grains and Starches  1 slice bread.   English muffin or hotdog/hamburger bun.   cup cold cereal (unsweetened).   cup cooked pasta or rice.   cup starchy vegetables (corn, potatoes, peas, beans, winter squash).  1 tortilla (6 inches).   bagel.  1 waffle or pancake (size of a CD).   cup cooked cereal.  4 to 6 small crackers. *Whole grain is recommended. Fruit  1 cup fresh unsweetened berries, melon, papaya, pineapple.  1 small fresh fruit.   banana or mango.   cup fruit juice (4 oz unsweetened).   cup canned fruit in natural juice or water.  2 tbs dried fruit.  12  to 15 grapes or cherries. Milk and Yogurt  1 cup fat-free or 1% milk.  1 cup soy milk.  6 oz light yogurt with sugar-free sweetener.  6 oz low-fat soy yogurt.  6 oz plain yogurt. Vegetables  1 cup raw or  cup cooked is counted as 0 carbohydrates or a "free" food.  If you eat 3 or more servings at 1 meal, count them as 1 carbohydrate serving. Other Carbohydrates   oz chips or pretzels.   cup ice cream or frozen yogurt.   cup sherbet or sorbet.  2 inch square cake, no frosting.  1 tbs honey, sugar, jam, jelly, or syrup.  2 small cookies.  3 squares of graham crackers.  3 cups popcorn.  6 crackers.  1 cup broth-based soup.  Count 1 cup casserole or other mixed foods as 2 carbohydrate servings.  Foods with less than 20 calories in a serving may be counted as 0 carbohydrates or a "free" food. You may want to purchase a book or computer software that lists the carbohydrate gram counts of different foods. In addition, the nutrition facts panel on the labels of the foods you eat are a good source of this information. The label will tell you how big the serving size is and the total number of carbohydrate grams you will be eating per serving. Divide this number by 15 to obtain the number of carbohydrate servings in a portion. Remember, 1 carbohydrate serving equals 15 grams of carbohydrate. SERVING SIZES Measuring foods and serving  sizes helps you make sure you are getting the right amount of food. The list below tells how big or small some common serving sizes are.  1 oz.........4 stacked dice.  3 oz........Marland KitchenDeck of cards.  1 tsp.......Marland KitchenTip of little finger.  1 tbs......Marland KitchenMarland KitchenThumb.  2 tbs.......Marland KitchenGolf ball.   cup......Marland KitchenHalf of a fist.  1 cup.......Marland KitchenA fist. SAMPLE DIABETES MEAL PLAN Below is a sample meal plan that includes foods from the grain and starches, dairy, vegetable, fruit, and meat groups. A dietician can individualize a meal plan to fit your calorie  needs and tell you the number of servings needed from each food group. However, controlling the total amount of carbohydrates in your meal or snack is more important than making sure you include all of the food groups at every meal. You may interchange carbohydrate containing foods (dairy, starches, and fruits). The meal plan below is an example of a 2000 calorie diet using carbohydrate counting. This meal plan has 17 carbohydrate servings. Breakfast  1 cup oatmeal (2 carb servings).   cup light yogurt (1 carb serving).  1 cup blueberries (1 carb serving).   cup almonds. Snack  1 large apple (2 carb servings).  1 low-fat string cheese stick. Lunch  Chicken breast salad.  1 cup spinach.   cup chopped tomatoes.  2 oz chicken breast, sliced.  2 tbs low-fat [place] dressing.  12 whole-wheat crackers (2 carb servings).  12 to 15 grapes (1 carb serving).  1 cup low-fat milk (1 carb serving). Snack  1 cup carrots.   cup hummus (1 carb serving). Dinner  3 oz broiled salmon.  1 cup brown rice (3 carb servings). Snack  1  cups steamed broccoli (1 carb serving) drizzled with 1 tsp olive oil and lemon juice.  1 cup light pudding (2 carb servings). DIABETES MEAL PLANNING WORKSHEET Your dietician can use this worksheet to help you decide how many servings of foods and what types of foods are right for you.  BREAKFAST Food Group and Servings / Carb Servings Grain/Starches __________________________________ Dairy __________________________________________ Vegetable ______________________________________ Fruit ___________________________________________ Meat __________________________________________ Fat ____________________________________________ LUNCH Food Group and Servings / Carb Servings Grain/Starches ___________________________________ Dairy ___________________________________________ Fruit ____________________________________________ Meat  ___________________________________________ Fat _____________________________________________ Wonda Cheng Food Group and Servings / Carb Servings Grain/Starches ___________________________________ Dairy ___________________________________________ Fruit ____________________________________________ Meat ___________________________________________ Fat _____________________________________________ SNACKS Food Group and Servings / Carb Servings Grain/Starches ___________________________________ Dairy ___________________________________________ Vegetable _______________________________________ Fruit ____________________________________________ Meat ___________________________________________ Fat _____________________________________________ DAILY TOTALS Starches _________________________ Vegetable ________________________ Fruit ____________________________ Dairy ____________________________ Meat ____________________________ Fat ______________________________ Document Released: 09/09/2005 Document Revised: 03/06/2012 Document Reviewed: 07/21/2009 Exit[employer] Patient Information 2014 [employer].

## 2014-03-11 ENCOUNTER — Ambulatory Visit: Payer: Self-pay | Admitting: Family Medicine

## 2014-03-11 ENCOUNTER — Telehealth: Payer: Self-pay | Admitting: Family Medicine

## 2014-03-11 NOTE — Telephone Encounter (Signed)
Relevant patient education assigned to patient using Emmi. ° °

## 2014-03-12 ENCOUNTER — Encounter: Payer: Self-pay | Admitting: Family Medicine

## 2014-03-23 ENCOUNTER — Other Ambulatory Visit: Payer: Self-pay | Admitting: Family Medicine

## 2014-04-14 ENCOUNTER — Other Ambulatory Visit: Payer: Self-pay | Admitting: Family Medicine

## 2014-06-02 ENCOUNTER — Telehealth: Payer: Self-pay | Admitting: Family Medicine

## 2014-06-02 DIAGNOSIS — E119 Type 2 diabetes mellitus without complications: Secondary | ICD-10-CM

## 2014-06-02 NOTE — Telephone Encounter (Signed)
Message copied by Jinny Sanders on Sun Jun 02, 2014 10:33 PM ------      Message from: Ellamae Sia      Created: Fri May 31, 2014  8:40 AM      Regarding: Lab orders for Tuesday, 6.9.15       Lab orders for a 3 month f/u ------

## 2014-06-04 ENCOUNTER — Other Ambulatory Visit (INDEPENDENT_AMBULATORY_CARE_PROVIDER_SITE_OTHER): Payer: BC Managed Care – PPO

## 2014-06-04 DIAGNOSIS — E119 Type 2 diabetes mellitus without complications: Secondary | ICD-10-CM

## 2014-06-04 LAB — HEMOGLOBIN A1C: HEMOGLOBIN A1C: 7.1 % — AB (ref 4.6–6.5)

## 2014-06-11 ENCOUNTER — Encounter: Payer: Self-pay | Admitting: Family Medicine

## 2014-06-11 ENCOUNTER — Ambulatory Visit (INDEPENDENT_AMBULATORY_CARE_PROVIDER_SITE_OTHER): Payer: BC Managed Care – PPO | Admitting: Family Medicine

## 2014-06-11 VITALS — BP 116/68 | HR 65 | Temp 97.9°F | Wt 185.8 lb

## 2014-06-11 DIAGNOSIS — E785 Hyperlipidemia, unspecified: Secondary | ICD-10-CM

## 2014-06-11 DIAGNOSIS — I10 Essential (primary) hypertension: Secondary | ICD-10-CM

## 2014-06-11 DIAGNOSIS — E119 Type 2 diabetes mellitus without complications: Secondary | ICD-10-CM

## 2014-06-11 DIAGNOSIS — K219 Gastro-esophageal reflux disease without esophagitis: Secondary | ICD-10-CM

## 2014-06-11 LAB — HM DIABETES FOOT EXAM

## 2014-06-11 NOTE — Assessment & Plan Note (Signed)
Start ptrilosec and trigger avoidance for 2 weeks. Call if not improving.

## 2014-06-11 NOTE — Assessment & Plan Note (Signed)
Well controlled. Continue current medication.  

## 2014-06-11 NOTE — Patient Instructions (Signed)
Start prilosec 20 mg daily for 2 week. Can try One a Day senior for a vitamin. Work Dispensing optician on diet changes. Low carb diet ([facility] or [employer] or on your own) Follow up in 3 months for DM follow up with labs prior.

## 2014-06-11 NOTE — Assessment & Plan Note (Signed)
Poor control. No t at goal. Pt hesitant about meds and possible associated weight gain.  She has further room for improvement with diet... Will work aggressively but if not < 7 next OV start glucotrol XL or [place].

## 2014-06-11 NOTE — Assessment & Plan Note (Signed)
At goal last check 

## 2014-06-11 NOTE — Progress Notes (Signed)
63 year old female presents for follow up DM and HTN   Diabetes: Inadequate control with diet. CONTINUED trend upwards  Lab Results  Component Value Date   HGBA1C 7.1* 06/04/2014  Using medications without difficulties:  Hypoglycemic episodes:None  Hyperglycemic episodes:None  Feet problems:None, Except toenail fungus  Blood Sugars averaging: Not checking  eye exam within last year: yes  Now walking 3 days a week. Moderate diet but still eating Sweet fruit and pasta.  Hypertension: Well controlled on current meds. BP Readings from Last 3 Encounters:  06/11/14 116/68  03/08/14 140/80  05/22/13 118/68  Using medication without problems or lightheadedness: None  Chest pain with exertion: None  Edema: None  Short of breath: None  Average home BPs: not checking   Wt Readings from Last 3 Encounters:  06/11/14 185 lb 12 oz (84.256 kg)  03/08/14 186 lb 4 oz (84.482 kg)  05/22/13 184 lb (83.462 kg)   She has evening indigestion and heart burn, burping once a week.  Using prilosec.  Elevated Cholesterol: Well controlled at last check on crestor 5 mg daily, using every other day.  Lab Results   Component  Value  Date    CHOL  159  03/01/2014    HDL  57.80  03/01/2014    LDLCALC  83  03/01/2014    LDLDIRECT  190.1  07/15/2011    TRIG  90.0  03/01/2014    CHOLHDL  3  03/01/2014   Using medications without problems. Yes.. Some body ache, but better on crestor every other day.  Muscle aches: yes  No exercise, moderate diet.  Other complaints:   Review of Systems  Constitutional: Negative for fever and fatigue.  HENT: Negative for ear pain.  Eyes: Negative for pain.  Respiratory: Negative for chest tightness and shortness of breath.  Cardiovascular: Negative for chest pain, palpitations and leg swelling.  Gastrointestinal: Negative for abdominal pain.  Genitourinary: Negative for dysuria.  Objective:   Physical Exam  Constitutional: Vital signs are normal. She appears well-developed and  well-nourished. She is cooperative. Non-toxic appearance. She does not appear ill. No distress.  HENT:  Head: Normocephalic.  Right Ear: Hearing, tympanic membrane, external ear and ear canal normal. Tympanic membrane is not erythematous, not retracted and not bulging.  Left Ear: Hearing, tympanic membrane, external ear and ear canal normal. Tympanic membrane is not erythematous, not retracted and not bulging.  Nose: No mucosal edema or rhinorrhea. Right sinus exhibits no maxillary sinus tenderness and no frontal sinus tenderness. Left sinus exhibits no maxillary sinus tenderness and no frontal sinus tenderness.  Mouth/Throat: Uvula is midline, oropharynx is clear and moist and mucous membranes are normal.  Eyes: Conjunctivae normal, EOM and lids are normal. Pupils are equal, round, and reactive to light. No foreign bodies found.  Neck: Trachea normal and normal range of motion. Neck supple. Carotid bruit is not present. No mass and no thyromegaly present.  Cardiovascular: Normal rate, regular rhythm, S1 normal, S2 normal, normal heart sounds, intact distal pulses and normal pulses. Exam reveals no gallop and no friction rub.  No murmur heard.  Pulmonary/Chest: Effort normal and breath sounds normal. Not tachypneic. No respiratory distress. She has no decreased breath sounds. She has no wheezes. She has no rhonchi. She has no rales.  Abdominal: Soft. Normal appearance and bowel sounds are normal. There is no tenderness.  Neurological: She is alert.  Skin: Skin is warm, dry and intact. No rash noted.  Psychiatric: Her speech is normal and  behavior is normal. Judgment and thought content normal. Her mood appears not anxious. Cognition and memory are normal. She does not exhibit a depressed mood.   Diabetic foot exam:  Normal inspection  No skin breakdown  Calluses On pads of feet B, no ulcers.  Normal DP pulses  Normal sensation to light touch and monofilament  Nails normal

## 2014-06-11 NOTE — Progress Notes (Signed)
Pre visit review using our clinic review tool, if applicable. No additional management support is needed unless otherwise documented below in the visit note. 

## 2014-07-24 ENCOUNTER — Other Ambulatory Visit: Payer: Self-pay | Admitting: Family Medicine

## 2014-09-06 ENCOUNTER — Other Ambulatory Visit: Payer: BC Managed Care – PPO

## 2014-09-09 ENCOUNTER — Other Ambulatory Visit (INDEPENDENT_AMBULATORY_CARE_PROVIDER_SITE_OTHER): Payer: Medicare Other

## 2014-09-09 DIAGNOSIS — E785 Hyperlipidemia, unspecified: Secondary | ICD-10-CM

## 2014-09-09 DIAGNOSIS — E119 Type 2 diabetes mellitus without complications: Secondary | ICD-10-CM

## 2014-09-09 LAB — COMPREHENSIVE METABOLIC PANEL
ALK PHOS: 97 U/L (ref 39–117)
ALT: 16 U/L (ref 0–35)
AST: 18 U/L (ref 0–37)
Albumin: 3.7 g/dL (ref 3.5–5.2)
BILIRUBIN TOTAL: 0.3 mg/dL (ref 0.2–1.2)
BUN: 17 mg/dL (ref 6–23)
CO2: 29 mEq/L (ref 19–32)
CREATININE: 0.8 mg/dL (ref 0.4–1.2)
Calcium: 9 mg/dL (ref 8.4–10.5)
Chloride: 103 mEq/L (ref 96–112)
GFR: 75.4 mL/min (ref >60.00)
GLUCOSE: 128 mg/dL — AB (ref 70–99)
Potassium: 3.7 mEq/L (ref 3.5–5.1)
Sodium: 139 mEq/L (ref 135–145)
Total Protein: 6.9 g/dL (ref 6.0–8.3)

## 2014-09-09 LAB — LIPID PANEL
CHOLESTEROL: 156 mg/dL (ref 0–200)
HDL: 39.3 mg/dL (ref >39.00)
LDL CALC: 104 mg/dL — AB (ref 0–99)
NonHDL: 116.7
TRIGLYCERIDES: 66 mg/dL (ref 0.0–149.0)
Total CHOL/HDL Ratio: 4
VLDL: 13.2 mg/dL (ref 0.0–40.0)

## 2014-09-09 LAB — HEMOGLOBIN A1C: Hgb A1c MFr Bld: 7.1 % — ABNORMAL HIGH (ref 4.6–6.5)

## 2014-09-13 ENCOUNTER — Ambulatory Visit (INDEPENDENT_AMBULATORY_CARE_PROVIDER_SITE_OTHER): Payer: Medicare Other | Admitting: Family Medicine

## 2014-09-13 ENCOUNTER — Encounter: Payer: Self-pay | Admitting: Family Medicine

## 2014-09-13 ENCOUNTER — Other Ambulatory Visit (HOSPITAL_COMMUNITY)
Admission: RE | Admit: 2014-09-13 | Discharge: 2014-09-13 | Disposition: A | Discharge status: Home / Self Care | Payer: Medicare Other | Source: Ambulatory Visit | Attending: Family Medicine | Admitting: Family Medicine

## 2014-09-13 VITALS — BP 122/70 | HR 58 | Temp 98.1°F | Ht 65.0 in | Wt 185.8 lb

## 2014-09-13 DIAGNOSIS — Z124 Encounter for screening for malignant neoplasm of cervix: Secondary | ICD-10-CM | POA: Insufficient documentation

## 2014-09-13 DIAGNOSIS — E785 Hyperlipidemia, unspecified: Secondary | ICD-10-CM

## 2014-09-13 DIAGNOSIS — Z1151 Encounter for screening for human papillomavirus (HPV): Secondary | ICD-10-CM | POA: Diagnosis present

## 2014-09-13 DIAGNOSIS — Z23 Encounter for immunization: Secondary | ICD-10-CM

## 2014-09-13 DIAGNOSIS — I10 Essential (primary) hypertension: Secondary | ICD-10-CM

## 2014-09-13 DIAGNOSIS — E119 Type 2 diabetes mellitus without complications: Secondary | ICD-10-CM

## 2014-09-13 DIAGNOSIS — Z86010 Personal history of colonic polyps: Secondary | ICD-10-CM

## 2014-09-13 DIAGNOSIS — Z0000 Encounter for general adult medical examination without abnormal findings: Secondary | ICD-10-CM

## 2014-09-13 MED ORDER — GLIPIZIDE ER 5 MG PO TB24: 5.0000 mg | ORAL_TABLET | Freq: Every day | ORAL | Status: DC

## 2014-09-13 NOTE — Assessment & Plan Note (Signed)
Well controlled. Continue current medication.  

## 2014-09-13 NOTE — Assessment & Plan Note (Signed)
Inadequate control. WIll start glucotrol Xl daily. Follow up with A1C in 3 months.

## 2014-09-13 NOTE — Progress Notes (Signed)
Pre visit review using our clinic review tool, if applicable. No additional management support is needed unless otherwise documented below in the visit note. 

## 2014-09-13 NOTE — Assessment & Plan Note (Signed)
Worsened control on same dose of crestor 5mg . Encouraged exercise, weight loss, healthy eating habits.

## 2014-09-13 NOTE — Patient Instructions (Signed)
Start glucotrol daily in AM with breakfast. Follow blood sugars at home if able. Stop at lab on way out to pick up stool test.

## 2014-09-13 NOTE — Progress Notes (Signed)
Subjective:    Patient ID: Meghan Kerr, female    DOB: 1951-11-20, 63 y.o.   MRN: [id number]  HPI  I have personally reviewed the Medicare Annual Wellness questionnaire and have noted 1. The patient's medical and social history 2. Their use of alcohol, tobacco or illicit drugs 3. Their current medications and supplements 4. The patient's functional ability including ADL's, fall risks, home safety risks and hearing or visual             impairment. 5. Diet and physical activities 6. Evidence for depression or mood disorders The patients weight, height, BMI and visual acuity have been recorded in the chart I have made referrals, counseling and provided education to the patient based review of the above and I have provided the pt with a written personalized care plan for preventive services.  She has had a lot of stress in last feww weeks.  Diabetes: Inadequate control with diet. Now stable on no medication.  Lab Results  Component Value Date   HGBA1C 7.1* 09/09/2014  Using medications without difficulties:  Hypoglycemic episodes:None  Hyperglycemic episodes:None  Feet problems:None, Except toenail fungus  Blood Sugars averaging: Not checking  eye exam within last year: yes Trying to keep status quo, cannot increase diet given stress, had to stop exercising in last month.  Hypertension: Well controlled on current meds.  BP Readings from Last 3 Encounters:  09/13/14 122/70  06/11/14 116/68  03/08/14 140/80  Using medication without problems or lightheadedness: None  Chest pain with exertion: None  Edema: None  Short of breath: None  Average home BPs: not checking   Wt Readings from Last 3 Encounters:  09/13/14 185 lb 12 oz (84.256 kg)  06/11/14 185 lb 12 oz (84.256 kg)  03/08/14 186 lb 4 oz (84.482 kg)     Elevated Cholesterol: Well controlled at last check on crestor 5 mg daily, using every other day.  Lab Results  Component Value Date   CHOL 156 09/09/2014   HDL  39.30 09/09/2014   LDLCALC 104* 09/09/2014   LDLDIRECT 190.1 07/15/2011   TRIG 66.0 09/09/2014   CHOLHDL 4 09/09/2014   Using medications without problems. Yes.. Some body ache, but better on crestor every other day.  Muscle aches: yes  No exercise, moderate diet.  Other complaints:   Review of Systems  Constitutional: Negative for fever and fatigue.  HENT: Negative for ear pain.  Eyes: Negative for pain.  Respiratory: Negative for chest tightness and shortness of breath.  Cardiovascular: Negative for chest pain, palpitations and leg swelling.  Gastrointestinal: Negative for abdominal pain.  Genitourinary: Negative for dysuria.  Objective:   Physical Exam  Constitutional: Vital signs are normal. She appears well-developed and well-nourished. She is cooperative. Non-toxic appearance. She does not appear ill. No distress.  HENT:  Head: Normocephalic.  Right Ear: Hearing, tympanic membrane, external ear and ear canal normal. Tympanic membrane is not erythematous, not retracted and not bulging.  Left Ear: Hearing, tympanic membrane, external ear and ear canal normal. Tympanic membrane is not erythematous, not retracted and not bulging.  Nose: No mucosal edema or rhinorrhea. Right sinus exhibits no maxillary sinus tenderness and no frontal sinus tenderness. Left sinus exhibits no maxillary sinus tenderness and no frontal sinus tenderness.  Mouth/Throat: Uvula is midline, oropharynx is clear and moist and mucous membranes are normal.  Eyes: Conjunctivae normal, EOM and lids are normal. Pupils are equal, round, and reactive to light. No foreign bodies found.  Neck: Trachea normal  and normal range of motion. Neck supple. Carotid bruit is not present. No mass and no thyromegaly present.  Cardiovascular: Normal rate, regular rhythm, S1 normal, S2 normal, normal heart sounds, intact distal pulses and normal pulses. Exam reveals no gallop and no friction rub.  No murmur heard.  Pulmonary/Chest: Effort  normal and breath sounds normal. Not tachypneic. No respiratory distress. She has no decreased breath sounds. She has no wheezes. She has no rhonchi. She has no rales.  Abdominal: Soft. Normal appearance and bowel sounds are normal. There is no tenderness.  Neurological: She is alert.  Skin: Skin is warm, dry and intact. No rash noted.  Psychiatric: Her speech is normal and behavior is normal. Judgment and thought content normal. Her mood appears not anxious. Cognition and memory are normal. She does not exhibit a depressed mood.  GYN: breast bilateral no masses Pelvic exam: pap obtained, no cervical lesions, no ovarian or uterine masses Diabetic foot exam:  Normal inspection  No skin breakdown  Calluses On pads of feet B, no ulcers.  Normal DP pulses  Normal sensation to light touch and monofilament  Nails normal        Review of Systems     Objective:   Physical Exam        Assessment & Plan:  The patient's preventative maintenance and recommended screening tests for an annual wellness exam were reviewed in full today.  Brought up to date unless services declined.  Counselled on the importance of diet, exercise, and its role in overall health and mortality.  The patient's FH and SH was reviewed, including their home life, tobacco status, and drug and alcohol status.   Vaccines; Uptodate PNA and Td, declined zostavax 2015, Due for flu and prevnar DVE/pap:04/23/2011, on q3 year schedule.. Due.  Colon: Date of Last Colonoscopy: 05/28/2007.. In [place] Results: Hyperplastic Polyp, recommended repeat in 5 years. She request stool ifob instead. Mammo:02/2014 DEXA: osteopenia in 04/2011, repeat in 2017

## 2014-09-16 LAB — CYTOLOGY - PAP

## 2014-10-03 ENCOUNTER — Other Ambulatory Visit: Payer: Self-pay | Admitting: Family Medicine

## 2014-10-05 ENCOUNTER — Other Ambulatory Visit: Payer: Self-pay | Admitting: Family Medicine

## 2014-10-09 ENCOUNTER — Telehealth: Payer: Self-pay | Admitting: Family Medicine

## 2014-10-09 NOTE — Telephone Encounter (Signed)
Patient Information:  Caller Name: [place]  Phone: [phone]  Patient: Meghan Kerr, Meghan Kerr  Gender: Female  DOB: June 30, 1951  Age: 63 Years  PCP: Eliezer Lofts (Family Practice)  Office Follow Up:  Does the office need to follow up with this patient?: No  Instructions For The Office: N/A  RN Note:  Per EPIC venlafaxine XR (EFFEXOR-XR) 37.5 MG 24 hr capsule dispense: 30 capsule refills:5 07/24/2014        Sig: TAKE 1 CAPSULE (37.5 MG TOTAL) BY MOUTH DAILY.      E-Prescribing Status: Receipt confirmed by pharmacy (07/24/2014  8:07 AM EDT)  Contacted [facility] and spoke with Estill Bamberg, [facility] to confirm Rx receipt.  [facility] reports Rx not received. Called in Effexor XR 37.5 mg as noted above disp #30, refills 2.  Symptoms  Reason For Call & Symptoms: Pt states she was advised per pharmacy she does not have any refills on her Effexor  XR 37.5mg .  Reviewed Health History In EMR: Yes  Reviewed Medications In EMR: Yes  Reviewed Allergies In EMR: Yes  Reviewed Surgeries / Procedures: Yes  Date of Onset of Symptoms: 10/09/2014  Guideline(s) Used:  No Protocol Available - Information Only  Disposition Per Guideline:   Home Care  Reason For Disposition Reached:   Information only question and nurse able to answer  Advice Given:  Call Back If:  New symptoms develop  You become worse.  Patient Will Follow Care Advice:  YES

## 2014-10-10 ENCOUNTER — Other Ambulatory Visit: Payer: Self-pay | Admitting: Family Medicine

## 2014-10-25 ENCOUNTER — Encounter: Payer: Self-pay | Admitting: Family Medicine

## 2014-10-25 ENCOUNTER — Ambulatory Visit (INDEPENDENT_AMBULATORY_CARE_PROVIDER_SITE_OTHER): Payer: Medicare Other | Admitting: Family Medicine

## 2014-10-25 VITALS — BP 120/60 | HR 56 | Temp 97.8°F | Ht 65.0 in | Wt 187.8 lb

## 2014-10-25 DIAGNOSIS — H1013 Acute atopic conjunctivitis, bilateral: Secondary | ICD-10-CM

## 2014-10-25 DIAGNOSIS — H1010 Acute atopic conjunctivitis, unspecified eye: Secondary | ICD-10-CM | POA: Insufficient documentation

## 2014-10-25 DIAGNOSIS — R5383 Other fatigue: Secondary | ICD-10-CM

## 2014-10-25 LAB — CBC WITH DIFFERENTIAL/PLATELET
BASOS ABS: 0 10*3/uL (ref 0.0–0.1)
Basophils Relative: 0.3 % (ref 0.0–3.0)
EOS ABS: 0.1 10*3/uL (ref 0.0–0.7)
Eosinophils Relative: 1.7 % (ref 0.0–5.0)
HCT: 40.3 % (ref 36.0–46.0)
Hemoglobin: 13.1 g/dL (ref 12.0–15.0)
LYMPHS PCT: 20.7 % (ref 12.0–46.0)
Lymphs Abs: 1.7 10*3/uL (ref 0.7–4.0)
MCHC: 32.5 g/dL (ref 30.0–36.0)
MCV: 90.3 fl (ref 78.0–100.0)
Monocytes Absolute: 0.5 10*3/uL (ref 0.1–1.0)
Monocytes Relative: 6.2 % (ref 3.0–12.0)
NEUTROS ABS: 5.8 10*3/uL (ref 1.4–7.7)
Neutrophils Relative %: 71.1 % (ref 43.0–77.0)
Platelets: 305 10*3/uL (ref 150.0–400.0)
RBC: 4.46 Mil/uL (ref 3.87–5.11)
RDW: 13 % (ref 11.5–15.5)
WBC: 8.1 10*3/uL (ref 4.0–10.5)

## 2014-10-25 LAB — COMPREHENSIVE METABOLIC PANEL
ALT: 16 U/L (ref 0–35)
AST: 20 U/L (ref 0–37)
Albumin: 3.4 g/dL — ABNORMAL LOW (ref 3.5–5.2)
Alkaline Phosphatase: 84 U/L (ref 39–117)
BILIRUBIN TOTAL: 0.5 mg/dL (ref 0.2–1.2)
BUN: 16 mg/dL (ref 6–23)
CO2: 31 mEq/L (ref 19–32)
CREATININE: 0.8 mg/dL (ref 0.4–1.2)
Calcium: 9 mg/dL (ref 8.4–10.5)
Chloride: 103 mEq/L (ref 96–112)
GFR: 73.28 mL/min (ref >60.00)
Glucose, Bld: 90 mg/dL (ref 70–99)
Potassium: 4 mEq/L (ref 3.5–5.1)
Sodium: 138 mEq/L (ref 135–145)
Total Protein: 6.7 g/dL (ref 6.0–8.3)

## 2014-10-25 LAB — VITAMIN B12: Vitamin B-12: 278 pg/mL (ref 211–911)

## 2014-10-25 LAB — VITAMIN D 25 HYDROXY (VIT D DEFICIENCY, FRACTURES): VITD: 20.2 ng/mL — ABNORMAL LOW (ref 30.00–100.00)

## 2014-10-25 LAB — TSH: TSH: 0.89 u[IU]/mL (ref 0.35–4.50)

## 2014-10-25 MED ORDER — ESTROGENS, CONJUGATED 0.625 MG/GM VA CREA: 1.0000 | TOPICAL_CREAM | Freq: Every day | VAGINAL | Status: DC

## 2014-10-25 MED ORDER — TRIAMCINOLONE ACETONIDE 0.5 % EX CREA: TOPICAL_CREAM | Freq: Two times a day (BID) | CUTANEOUS | Status: DC

## 2014-10-25 MED ORDER — VITAMIN D (ERGOCALCIFEROL) 1.25 MG (50000 UNIT) PO CAPS
50000.0000 [IU] | ORAL_CAPSULE | ORAL | Status: DC
Start: 2014-10-25 — End: 2015-05-23

## 2014-10-25 NOTE — Addendum Note (Signed)
Addended by: Carter Kitten on: 10/25/2014 05:55 PM   Modules accepted: Orders

## 2014-10-25 NOTE — Patient Instructions (Signed)
Stop for labs on way out. Consider claritin or call if you are interested in antihistamine eye drops for eye symptoms. We may need  ot do a trial off glipizide.

## 2014-10-25 NOTE — Assessment & Plan Note (Signed)
?   Secondary to allergies, stress, ? Low CBG or glipizide SE.  Will eval with labs. Consider trial off glipizide.

## 2014-10-25 NOTE — Assessment & Plan Note (Signed)
Treat with daily Claritin, consider antihistamine drops for eyes.

## 2014-10-25 NOTE — Progress Notes (Signed)
   Subjective:    Patient ID: Meghan Kerr, female    DOB: [birth year], 63 y.o.   MRN: [id number]  HPI  63 year old female presents today to discuss medication as well as to discuss new issues.  She reports she has been tired in the last month. Going to bed early, resting well but still feeling tired. She has been very busy lately. She wonders if it is from the glipizide. She stated this 09/13/2014. She  Has forgotten to take glipizide on few days but still feels tired. Lab Results  Component Value Date   HGBA1C 7.1* 09/09/2014  Not checking blood sugars lately.  No bleeding, No cold intolerance. No SOB at rest, few times she has been DOE going up stairs at home., no CP.  no depression, no anxiety.  Wt Readings from Last 3 Encounters:  10/25/14 187 lb 12 oz (85.163 kg)  09/13/14 185 lb 12 oz (84.256 kg)  06/11/14 185 lb 12 oz (84.256 kg)    She does have some allergies. Uses claritin occ which helps. Eyes feel heavy.    Review of Systems  Constitutional: Negative for fever and fatigue.  HENT: Negative for ear pain.   Eyes: Negative for pain.  Respiratory: Negative for chest tightness and shortness of breath.   Cardiovascular: Negative for chest pain, palpitations and leg swelling.  Gastrointestinal: Negative for abdominal pain.  Genitourinary: Negative for dysuria.       Objective:   Physical Exam  Constitutional: Vital signs are normal. She appears well-developed and well-nourished. She is cooperative.  Non-toxic appearance. She does not appear ill. No distress.  HENT:  Head: Normocephalic.  Right Ear: Hearing, tympanic membrane, external ear and ear canal normal. Tympanic membrane is not erythematous, not retracted and not bulging.  Left Ear: Hearing, tympanic membrane, external ear and ear canal normal. Tympanic membrane is not erythematous, not retracted and not bulging.  Nose: No mucosal edema or rhinorrhea. Right sinus exhibits no maxillary sinus tenderness and no  frontal sinus tenderness. Left sinus exhibits no maxillary sinus tenderness and no frontal sinus tenderness.  Mouth/Throat: Uvula is midline, oropharynx is clear and moist and mucous membranes are normal.  Eyes: EOM and lids are normal. Pupils are equal, round, and reactive to light. Lids are everted and swept, no foreign bodies found. Right eye exhibits no discharge. Left eye exhibits no discharge. Right conjunctiva is injected. Left conjunctiva is injected.  Neck: Trachea normal and normal range of motion. Neck supple. Carotid bruit is not present. No mass and no thyromegaly present.  Cardiovascular: Normal rate, regular rhythm, S1 normal, S2 normal, normal heart sounds, intact distal pulses and normal pulses.  Exam reveals no gallop and no friction rub.   No murmur heard. Pulmonary/Chest: Effort normal and breath sounds normal. Not tachypneic. No respiratory distress. She has no decreased breath sounds. She has no wheezes. She has no rhonchi. She has no rales.  Abdominal: Soft. Normal appearance and bowel sounds are normal. There is no tenderness.  Neurological: She is alert.  Skin: Skin is warm, dry and intact. No rash noted.  Psychiatric: Her speech is normal and behavior is normal. Judgment and thought content normal. Her mood appears not anxious. Cognition and memory are normal. She does not exhibit a depressed mood.          Assessment & Plan:

## 2014-10-25 NOTE — Progress Notes (Signed)
Pre visit review using our clinic review tool, if applicable. No additional management support is needed unless otherwise documented below in the visit note. 

## 2015-01-15 ENCOUNTER — Ambulatory Visit (INDEPENDENT_AMBULATORY_CARE_PROVIDER_SITE_OTHER): Payer: Medicare PPO | Admitting: Internal Medicine

## 2015-01-15 ENCOUNTER — Encounter: Payer: Self-pay | Admitting: Internal Medicine

## 2015-01-15 VITALS — BP 126/74 | HR 74 | Temp 98.1°F | Wt 185.0 lb

## 2015-01-15 DIAGNOSIS — Z23 Encounter for immunization: Secondary | ICD-10-CM

## 2015-01-15 DIAGNOSIS — J069 Acute upper respiratory infection, unspecified: Secondary | ICD-10-CM

## 2015-01-15 MED ORDER — AZITHROMYCIN 250 MG PO TABS: ORAL_TABLET | ORAL | Status: DC

## 2015-01-15 MED ORDER — HYDROCODONE-HOMATROPINE 5-1.5 MG/5ML PO SYRP: 5.0000 mL | ORAL_SOLUTION | Freq: Three times a day (TID) | ORAL | Status: DC | PRN

## 2015-01-15 NOTE — Addendum Note (Signed)
Addended by: Lurlean Nanny on: 01/15/2015 04:07 PM   Modules accepted: Orders

## 2015-01-15 NOTE — Progress Notes (Signed)
Pre visit review using our clinic review tool, if applicable. No additional management support is needed unless otherwise documented below in the visit note. 

## 2015-01-15 NOTE — Progress Notes (Signed)
HPI  Pt presents to the clinic today with c/o cough and chest congestion. She reports this started 2 weeks ago. The cough is productive of green mucous. She has had a runny nose. She has some shortness of breath but only when coughing. She denies fever, but has had chills and body aches. She has tried Mucinex and Dayquil OTC without much relief. She has had sick contacts.  Review of Systems      Past Medical History  Diagnosis Date  . Hyperlipidemia   . Hypertension     Family History  Problem Relation Age of Onset  . Diabetes Mother   . Heart disease Mother     cad and chf  . Heart disease Father     heart attack (massive)  . Cancer Maternal Grandmother     COLON  . Stroke Maternal Grandfather   . Diabetes Brother   . Heart disease Brother 87    hert attack  . Heart disease Brother 42    CABG   . Stroke Sister   . Heart disease Sister     VALVE REPLACEMENT    History   Social History  . Marital Status: Married    Spouse Name: N/A    Number of Children: N/A  . Years of Education: N/A   Occupational History  . TEACHER     Kindergarten   Social History Main Topics  . Smoking status: Never Smoker   . Smokeless tobacco: Never Used  . Alcohol Use: No  . Drug Use: No  . Sexual Activity: Not on file   Other Topics Concern  . Not on file   Social History Narrative    Allergies  Allergen Reactions  . Amoxicillin     REACTION: rash  . Sulfonamide Derivatives     REACTION: Blisters on leg     Constitutional: Positive headache, fatigue. Denies fever or abrupt weight changes.  HEENT:  Positive runny nose. Denies eye redness, eye pain, pressure behind the eyes, facial pain, nasal congestion, ear pain, ringing in the ears, wax buildup or sore throat. Respiratory: Positive cough and shortness of breath. Denies difficulty breathing.  Cardiovascular: Denies chest pain, chest tightness, palpitations or swelling in the hands or feet.   No other specific complaints  in a complete review of systems (except as listed in HPI above).  Objective:   BP 126/74 mmHg  Pulse 74  Temp(Src) 98.1 F (36.7 C) (Oral)  Wt 185 lb (83.915 kg)  SpO2 98% Wt Readings from Last 3 Encounters:  01/15/15 185 lb (83.915 kg)  10/25/14 187 lb 12 oz (85.163 kg)  09/13/14 185 lb 12 oz (84.256 kg)     General: Appears her stated age, will appearing in NAD. HEENT: Head: normal shape and size, no sinus tenderness; Eyes: sclera white, no icterus, conjunctiva pink; Ears: Tm's gray and intact, normal light reflex; Nose: mucosa pink and moist, septum midline; Throat/Mouth:  Teeth present, mucosa pink and moist, no exudate noted, no lesions or ulcerations noted.  Neck: No lymphadenopathy.  Cardiovascular: Normal rate and rhythm. S1,S2 noted.  No murmur, rubs or gallops noted.  Pulmonary/Chest: Normal effort and positive vesicular breath sounds. No respiratory distress. No wheezes, rales or ronchi noted.      Assessment & Plan:   Upper Respiratory Infection:  Get some rest and drink plenty of water Do salt water gargles for the sore throat eRx for Azithromax x 5 days Rx for Hycodan cough syrup  RTC as needed or if  symptoms persist.

## 2015-01-15 NOTE — Patient Instructions (Signed)
Upper Respiratory Infection, Adult An upper respiratory infection (URI) is also sometimes known as the common cold. The upper respiratory tract includes the nose, sinuses, throat, trachea, and bronchi. Bronchi are the airways leading to the lungs. Most people improve within 1 week, but symptoms can last up to 2 weeks. A residual cough may last even longer.  CAUSES Many different viruses can infect the tissues lining the upper respiratory tract. The tissues become irritated and inflamed and often become very moist. Mucus production is also common. A cold is contagious. You can easily spread the virus to others by oral contact. This includes kissing, sharing a glass, coughing, or sneezing. Touching your mouth or nose and then touching a surface, which is then touched by another person, can also spread the virus. SYMPTOMS  Symptoms typically develop 1 to 3 days after you come in contact with a cold virus. Symptoms vary from person to person. They may include:  Runny nose.  Sneezing.  Nasal congestion.  Sinus irritation.  Sore throat.  Loss of voice (laryngitis).  Cough.  Fatigue.  Muscle aches.  Loss of appetite.  Headache.  Low-grade fever. DIAGNOSIS  You might diagnose your own cold based on familiar symptoms, since most people get a cold 2 to 3 times a year. Your caregiver can confirm this based on your exam. Most importantly, your caregiver can check that your symptoms are not due to another disease such as strep throat, sinusitis, pneumonia, asthma, or epiglottitis. Blood tests, throat tests, and X-rays are not necessary to diagnose a common cold, but they may sometimes be helpful in excluding other more serious diseases. Your caregiver will decide if any further tests are required. RISKS AND COMPLICATIONS  You may be at risk for a more severe case of the common cold if you smoke cigarettes, have chronic heart disease (such as heart failure) or lung disease (such as asthma), or if  you have a weakened immune system. The very young and very old are also at risk for more serious infections. Bacterial sinusitis, middle ear infections, and bacterial pneumonia can complicate the common cold. The common cold can worsen asthma and chronic obstructive pulmonary disease (COPD). Sometimes, these complications can require emergency medical care and may be life-threatening. PREVENTION  The best way to protect against getting a cold is to practice good hygiene. Avoid oral or hand contact with people with cold symptoms. Wash your hands often if contact occurs. There is no clear evidence that vitamin C, vitamin E, echinacea, or exercise reduces the chance of developing a cold. However, it is always recommended to get plenty of rest and practice good nutrition. TREATMENT  Treatment is directed at relieving symptoms. There is no cure. Antibiotics are not effective, because the infection is caused by a virus, not by bacteria. Treatment may include:  Increased fluid intake. Sports drinks offer valuable electrolytes, sugars, and fluids.  Breathing heated mist or steam (vaporizer or shower).  Eating chicken soup or other clear broths, and maintaining good nutrition.  Getting plenty of rest.  Using gargles or lozenges for comfort.  Controlling fevers with ibuprofen or acetaminophen as directed by your caregiver.  Increasing usage of your inhaler if you have asthma. Zinc gel and zinc lozenges, taken in the first 24 hours of the common cold, can shorten the duration and lessen the severity of symptoms. Pain medicines may help with fever, muscle aches, and throat pain. A variety of non-prescription medicines are available to treat congestion and runny nose. Your caregiver   can make recommendations and may suggest nasal or lung inhalers for other symptoms.  HOME CARE INSTRUCTIONS   Only take over-the-counter or prescription medicines for pain, discomfort, or fever as directed by your  caregiver.  Use a warm mist humidifier or inhale steam from a shower to increase air moisture. This may keep secretions moist and make it easier to breathe.  Drink enough water and fluids to keep your urine clear or pale yellow.  Rest as needed.  Return to work when your temperature has returned to normal or as your caregiver advises. You may need to stay home longer to avoid infecting others. You can also use a face mask and careful hand washing to prevent spread of the virus. SEEK MEDICAL CARE IF:   After the first few days, you feel you are getting worse rather than better.  You need your caregiver's advice about medicines to control symptoms.  You develop chills, worsening shortness of breath, or brown or red sputum. These may be signs of pneumonia.  You develop yellow or brown nasal discharge or pain in the face, especially when you bend forward. These may be signs of sinusitis.  You develop a fever, swollen neck glands, pain with swallowing, or white areas in the back of your throat. These may be signs of strep throat. SEEK IMMEDIATE MEDICAL CARE IF:   You have a fever.  You develop severe or persistent headache, ear pain, sinus pain, or chest pain.  You develop wheezing, a prolonged cough, cough up blood, or have a change in your usual mucus (if you have chronic lung disease).  You develop sore muscles or a stiff neck. Document Released: 06/08/2001 Document Revised: 03/06/2012 Document Reviewed: 03/20/2014 Exit[employer] Patient Information 2015 [employer]. This information is not intended to replace advice given to you by your health care provider. Make sure you discuss any questions you have with your health care provider.  

## 2015-02-28 ENCOUNTER — Other Ambulatory Visit: Payer: Self-pay | Admitting: Family Medicine

## 2015-03-01 ENCOUNTER — Other Ambulatory Visit: Payer: Self-pay | Admitting: Family Medicine

## 2015-05-23 ENCOUNTER — Ambulatory Visit (INDEPENDENT_AMBULATORY_CARE_PROVIDER_SITE_OTHER): Payer: Medicare PPO | Admitting: Family Medicine

## 2015-05-23 ENCOUNTER — Encounter: Payer: Self-pay | Admitting: Family Medicine

## 2015-05-23 VITALS — BP 120/68 | HR 56 | Temp 98.2°F | Ht 65.0 in | Wt 181.2 lb

## 2015-05-23 DIAGNOSIS — L259 Unspecified contact dermatitis, unspecified cause: Secondary | ICD-10-CM

## 2015-05-23 DIAGNOSIS — R253 Fasciculation: Secondary | ICD-10-CM | POA: Insufficient documentation

## 2015-05-23 DIAGNOSIS — R258 Other abnormal involuntary movements: Secondary | ICD-10-CM | POA: Diagnosis not present

## 2015-05-23 DIAGNOSIS — F341 Dysthymic disorder: Secondary | ICD-10-CM

## 2015-05-23 DIAGNOSIS — Z1211 Encounter for screening for malignant neoplasm of colon: Secondary | ICD-10-CM | POA: Diagnosis not present

## 2015-05-23 MED ORDER — TRIAMCINOLONE ACETONIDE 0.5 % EX OINT: 1.0000 "application " | TOPICAL_OINTMENT | Freq: Two times a day (BID) | CUTANEOUS | Status: DC

## 2015-05-23 NOTE — Progress Notes (Signed)
   Subjective:    Patient ID: Meghan Kerr, female    DOB: Mar 13, 1951, 64 y.o.   MRN: [id number]  HPI  64 year old female with well controlled DM, anxiety presents with several new concerns.  1. Rash: 1 month in central back. Bumps, no blisters, no pustules Has sensation of something on back. Sightly itchy.  Tried different bras, she has tried anti itching gel. Possible new exposures, buys things on sale.   2. Twitching at mouth, left side: Noted in past few weeks. HAs had increase in stress lately.No other cramping.  Seems to happen when more stress and fatigued.  She has been off venlafaxine for 3 weeks, doing fairly well off the med.. Denies depression. No SI.   3.Date of Last Colonoscopy: 05/28/2007.. In [place] Results: Hyperplastic Polyp, recommended repeat in 5 years.   No first degree relative.  Has been doing ifobs.  She is okay with continuing this.      Review of Systems  Constitutional: Negative for fever and fatigue.  HENT: Negative for ear pain.   Eyes: Negative for pain.  Respiratory: Negative for chest tightness and shortness of breath.   Cardiovascular: Negative for chest pain, palpitations and leg swelling.  Gastrointestinal: Negative for abdominal pain.  Genitourinary: Negative for dysuria.       Objective:   Physical Exam  Constitutional: Vital signs are normal. She appears well-developed and well-nourished. She is cooperative.  Non-toxic appearance. She does not appear ill. No distress.  HENT:  Head: Normocephalic.  Right Ear: Hearing, tympanic membrane, external ear and ear canal normal. Tympanic membrane is not erythematous, not retracted and not bulging.  Left Ear: Hearing, tympanic membrane, external ear and ear canal normal. Tympanic membrane is not erythematous, not retracted and not bulging.  Nose: No mucosal edema or rhinorrhea. Right sinus exhibits no maxillary sinus tenderness and no frontal sinus tenderness. Left sinus exhibits no  maxillary sinus tenderness and no frontal sinus tenderness.  Mouth/Throat: Uvula is midline, oropharynx is clear and moist and mucous membranes are normal.  Eyes: Conjunctivae, EOM and lids are normal. Pupils are equal, round, and reactive to light. Lids are everted and swept, no foreign bodies found.  Neck: Trachea normal and normal range of motion. Neck supple. Carotid bruit is not present. No thyroid mass and no thyromegaly present.  Cardiovascular: Normal rate, regular rhythm, S1 normal, S2 normal, normal heart sounds, intact distal pulses and normal pulses.  Exam reveals no gallop and no friction rub.   No murmur heard. Pulmonary/Chest: Effort normal and breath sounds normal. No tachypnea. No respiratory distress. She has no decreased breath sounds. She has no wheezes. She has no rhonchi. She has no rales.  Abdominal: Soft. Normal appearance and bowel sounds are normal. There is no tenderness.  Neurological: She is alert.  Skin: Skin is warm, dry and intact. No rash noted.  No rash, several excoriations  In central back  Psychiatric: Her speech is normal and behavior is normal. Judgment and thought content normal. Her mood appears not anxious. Cognition and memory are normal. She does not exhibit a depressed mood.          Assessment & Plan:

## 2015-05-23 NOTE — Assessment & Plan Note (Signed)
No clear rash. ? Irritated are with heat, exposure to unknown trigger.  Treat with topical steroid x 2 weeks.  Follow up with derm if not improving.

## 2015-05-23 NOTE — Patient Instructions (Addendum)
Work on stress reduction. Relaxation. Call if mood not well controlled to consider restarting venlafaxine.  Stop at lab on way out to pick up IFOB. Apply steroid cream to area on back x 2 weeks.  Follow up with derm if not improving.

## 2015-05-23 NOTE — Assessment & Plan Note (Signed)
Follow off venlafaxine. MAy be having increase in anxiety given increase in concern/anxiety  of several small heath issues.

## 2015-05-23 NOTE — Assessment & Plan Note (Signed)
Likely triggeres by stress and fatigue. No red flags.

## 2015-05-23 NOTE — Progress Notes (Signed)
Pre visit review using our clinic review tool, if applicable. No additional management support is needed unless otherwise documented below in the visit note. 

## 2015-05-29 ENCOUNTER — Other Ambulatory Visit (INDEPENDENT_AMBULATORY_CARE_PROVIDER_SITE_OTHER): Payer: Medicare PPO

## 2015-05-29 DIAGNOSIS — Z1211 Encounter for screening for malignant neoplasm of colon: Secondary | ICD-10-CM

## 2015-05-29 LAB — FECAL OCCULT BLOOD, IMMUNOCHEMICAL: FECAL OCCULT BLD: NEGATIVE

## 2015-05-30 ENCOUNTER — Encounter: Payer: Self-pay | Admitting: *Deleted

## 2015-05-30 ENCOUNTER — Telehealth: Payer: Self-pay | Admitting: Family Medicine

## 2015-05-30 MED ORDER — VENLAFAXINE HCL ER 37.5 MG PO CP24: ORAL_CAPSULE | ORAL | Status: DC

## 2015-05-30 NOTE — Addendum Note (Signed)
Addended by: Eliezer Lofts E on: 05/30/2015 05:07 PM   Modules accepted: Orders

## 2015-05-30 NOTE — Telephone Encounter (Signed)
Bradee notified as instructed by telephone.

## 2015-05-30 NOTE — Telephone Encounter (Signed)
Rx sent in.  If tolerating daily after 1 week can go up to 2 tabs daily for usual maintenance dose.

## 2015-05-30 NOTE — Telephone Encounter (Signed)
Patient was seen last Friday.  Patient was going to stop taking the Venlafaxine.  Patient found out after her appointment that her oldest sister has a rare form of bladder cancer.  Patient is under a lot of stress. Her other sister is showing signs of Dementia.  Patient would like to refill Venlafaxine and she'd like to take it every day. [facility] uses [facility]. Please call patient when rx is called in to pharmacy.

## 2015-06-01 LAB — HM DIABETES EYE EXAM

## 2015-09-18 ENCOUNTER — Telehealth: Payer: Self-pay | Admitting: Family Medicine

## 2015-09-19 ENCOUNTER — Ambulatory Visit: Payer: Medicare PPO | Admitting: Family Medicine

## 2015-09-19 NOTE — Telephone Encounter (Signed)
Please call and schedule Medicare Wellness with fasting labs prior with Dr. Bedsole. 

## 2015-09-23 ENCOUNTER — Encounter: Payer: Self-pay | Admitting: Family Medicine

## 2015-09-23 ENCOUNTER — Ambulatory Visit (INDEPENDENT_AMBULATORY_CARE_PROVIDER_SITE_OTHER): Payer: Medicare PPO | Admitting: Family Medicine

## 2015-09-23 VITALS — BP 138/76 | HR 65 | Temp 97.8°F | Ht 65.0 in | Wt 184.0 lb

## 2015-09-23 DIAGNOSIS — L710 Perioral dermatitis: Secondary | ICD-10-CM | POA: Diagnosis not present

## 2015-09-23 DIAGNOSIS — L259 Unspecified contact dermatitis, unspecified cause: Secondary | ICD-10-CM | POA: Diagnosis not present

## 2015-09-23 NOTE — Assessment & Plan Note (Signed)
Minimal improvement with steroid, hold.

## 2015-09-23 NOTE — Progress Notes (Signed)
Pre visit review using our clinic review tool, if applicable. No additional management support is needed unless otherwise documented below in the visit note. 

## 2015-09-23 NOTE — Progress Notes (Signed)
   Subjective:    Patient ID: Meghan Kerr, female    DOB: 1951-10-16, 64 y.o.   MRN: [id number]  HPI  64 year old female presents with new onset rash in last 5 days on chin , around mouth. Red bumps, no blisters or pustules.  Had been using topical steroid OTC on face, got better now came back in last 2 days much worse than before. No itch, more tightness than anything, slight tingle.  Also under breast on right and between breasts. In last 3 days. Slightly itchy. No fever, no flu-like symptoms.  She has noted minimal  improvement in contact dermatitis with  Triamcinolone 0.5% cream sine 04/2015.  Social History /Family History/Past Medical History reviewed and updated if needed.      Review of Systems  Constitutional: Negative for fever and fatigue.  HENT: Negative for ear pain.   Eyes: Negative for pain.  Respiratory: Negative for chest tightness and shortness of breath.   Cardiovascular: Negative for chest pain, palpitations and leg swelling.  Gastrointestinal: Negative for abdominal pain.  Genitourinary: Negative for dysuria.       Objective:   Physical Exam  Constitutional: Vital signs are normal. She appears well-developed and well-nourished. She is cooperative.  Non-toxic appearance. She does not appear ill. No distress.  HENT:  Head: Normocephalic.  Right Ear: Hearing, tympanic membrane, external ear and ear canal normal. Tympanic membrane is not erythematous, not retracted and not bulging.  Left Ear: Hearing, tympanic membrane, external ear and ear canal normal. Tympanic membrane is not erythematous, not retracted and not bulging.  Nose: No mucosal edema or rhinorrhea. Right sinus exhibits no maxillary sinus tenderness and no frontal sinus tenderness. Left sinus exhibits no maxillary sinus tenderness and no frontal sinus tenderness.  Mouth/Throat: Uvula is midline, oropharynx is clear and moist and mucous membranes are normal.  Eyes: Conjunctivae, EOM and lids are  normal. Pupils are equal, round, and reactive to light. Lids are everted and swept, no foreign bodies found.  Neck: Trachea normal and normal range of motion. Neck supple. Carotid bruit is not present. No thyroid mass and no thyromegaly present.  Cardiovascular: Normal rate, regular rhythm, S1 normal, S2 normal, normal heart sounds, intact distal pulses and normal pulses.  Exam reveals no gallop and no friction rub.   No murmur heard. Pulmonary/Chest: Effort normal and breath sounds normal. No tachypnea. No respiratory distress. She has no decreased breath sounds. She has no wheezes. She has no rhonchi. She has no rales.  Abdominal: Soft. Normal appearance and bowel sounds are normal. There is no tenderness.  Neurological: She is alert.  Skin: Skin is warm, dry and intact. Rash noted. Rash is maculopapular. Rash is not pustular.     Erythematous papules around mouth, none on lips or buccal mucosa. Excoriations on back and one excoriation under left breast  Psychiatric: Her speech is normal and behavior is normal. Judgment and thought content normal. Her mood appears not anxious. Cognition and memory are normal. She does not exhibit a depressed mood.          Assessment & Plan:

## 2015-09-23 NOTE — Patient Instructions (Signed)
Hold all steroids. Start metro gel on face around mouth.  Hypoallergenic detergent ETC.  Call if not improving in 2 weeks for referral to [facility].

## 2015-09-23 NOTE — Assessment & Plan Note (Signed)
Likely due to topical steroid. Hold all steroids. Treat with metronidazole gel. If not improving refer to derm for further eval.

## 2015-09-24 ENCOUNTER — Telehealth: Payer: Self-pay | Admitting: Family Medicine

## 2015-09-24 MED ORDER — METRONIDAZOLE 1 % EX GEL: Freq: Every day | CUTANEOUS | Status: DC

## 2015-09-24 NOTE — Telephone Encounter (Signed)
Pt states she was supposed to get Metro Gel for her rxn.  [facility]  She called today and they did not have it.  Please call in [facility] to [facility] Dr. Diona Browner stated in her note from 9/28:  Likely due to topical steroid. Hold all steroids. Treat with metronidazole gel. If not improving refer to derm for further eval

## 2015-09-24 NOTE — Telephone Encounter (Signed)
Meghan Kerr notified [facility] [place].

## 2015-09-29 ENCOUNTER — Telehealth: Payer: Self-pay

## 2015-09-29 NOTE — Telephone Encounter (Signed)
Left message asking pt to call office Dr Diona Browner next cpx appointment 10/7 or 01/02/16

## 2015-09-29 NOTE — Telephone Encounter (Signed)
Pt left v/m; pt returning call about rash on face; pt said still has rash on face but slight improvement with rash on face. Pt request cb. Left v/m requesting cb.

## 2015-09-30 NOTE — Telephone Encounter (Signed)
Pt called back and pts husband spoke with someone on 09/29/15 but does not know the name. Butch Penny CMA did not try to contact pt. Pt said her face is doing better and pt is to cb next week with update so pt will cb then unless gets another call.

## 2015-10-02 ENCOUNTER — Ambulatory Visit (INDEPENDENT_AMBULATORY_CARE_PROVIDER_SITE_OTHER): Payer: Medicare PPO | Admitting: Family Medicine

## 2015-10-02 ENCOUNTER — Encounter: Payer: Self-pay | Admitting: Family Medicine

## 2015-10-02 VITALS — BP 144/84 | HR 71 | Temp 97.9°F | Ht 65.0 in | Wt 183.5 lb

## 2015-10-02 DIAGNOSIS — L259 Unspecified contact dermatitis, unspecified cause: Secondary | ICD-10-CM | POA: Diagnosis not present

## 2015-10-02 DIAGNOSIS — L710 Perioral dermatitis: Secondary | ICD-10-CM | POA: Diagnosis not present

## 2015-10-02 NOTE — Assessment & Plan Note (Signed)
Rash on back .Marland Kitchen Not really bothering pt, resolving.. This is what steroid were used originally for. No longer taking steroids.

## 2015-10-02 NOTE — Progress Notes (Signed)
Pre visit review using our clinic review tool, if applicable. No additional management support is needed unless otherwise documented below in the visit note. 

## 2015-10-02 NOTE — Patient Instructions (Addendum)
Stop at front desk for referral to dermatologist.  Continue metronidazole gel.

## 2015-10-02 NOTE — Progress Notes (Signed)
   Subjective:    Patient ID: Meghan Kerr, female    DOB: [birth year], 64 y.o.   MRN: [id number]  HPI  64 year old female presents for continued rash on face.  At Last OV 09/23/2015:  She reported rash in last 5 days on chin , around mouth. Red bumps, no blisters or pustules. Had been using topical steroid OTC on face, got better now came back in last 2 days much worse than before. No itch, more tightness than anything, slight tingle. Also under breast on right and between breasts. In last 3 days. Slightly itchy. No fever, no flu-like symptoms. She has noted minimal improvement in contact dermatitis with Triamcinolone 0.5% cream sine 04/2015.   At last OV felt rash at face was secondary to steroid use for contact dermatitis.  Pt reports she has tried metronidazole gel for perioral dermatitis with some improvement, Less nodular. Using metronidazole gel twice a day now or    Now new lesions under right eye and down on neck.Tingly feeling, not really itchy.   She has even stopped her cleanser.   Review of Systems  Constitutional: Negative for fever and fatigue.  HENT: Negative for ear pain.   Eyes: Negative for pain.  Respiratory: Negative for chest tightness and shortness of breath.   Cardiovascular: Negative for chest pain, palpitations and leg swelling.  Gastrointestinal: Negative for abdominal pain.  Genitourinary: Negative for dysuria.       Objective:   Physical Exam  Constitutional: Vital signs are normal. She appears well-developed and well-nourished. She is cooperative.  Non-toxic appearance. She does not appear ill. No distress.  HENT:  Head: Normocephalic.  Right Ear: Hearing, tympanic membrane, external ear and ear canal normal. Tympanic membrane is not erythematous, not retracted and not bulging.  Left Ear: Hearing, tympanic membrane, external ear and ear canal normal. Tympanic membrane is not erythematous, not retracted and not bulging.  Nose: No mucosal edema  or rhinorrhea. Right sinus exhibits no maxillary sinus tenderness and no frontal sinus tenderness. Left sinus exhibits no maxillary sinus tenderness and no frontal sinus tenderness.  Mouth/Throat: Uvula is midline, oropharynx is clear and moist and mucous membranes are normal.  Eyes: Conjunctivae, EOM and lids are normal. Pupils are equal, round, and reactive to light. Lids are everted and swept, no foreign bodies found.  Neck: Trachea normal and normal range of motion. Neck supple. Carotid bruit is not present. No thyroid mass and no thyromegaly present.  Cardiovascular: Normal rate, regular rhythm, S1 normal, S2 normal, normal heart sounds, intact distal pulses and normal pulses.  Exam reveals no gallop and no friction rub.   No murmur heard. Pulmonary/Chest: Effort normal and breath sounds normal. No tachypnea. No respiratory distress. She has no decreased breath sounds. She has no wheezes. She has no rhonchi. She has no rales.  Abdominal: Soft. Normal appearance and bowel sounds are normal. There is no tenderness.  Neurological: She is alert.  Skin: Skin is warm, dry and intact. Rash noted. Rash is maculopapular. Rash is not pustular.     Erythematous papules around mouth, none on lips or buccal mucosa. Excoriations on back and one excoriation under left breast  Psychiatric: Her speech is normal and behavior is normal. Judgment and thought content normal. Her mood appears not anxious. Cognition and memory are normal. She does not exhibit a depressed mood.          Assessment & Plan:

## 2015-10-02 NOTE — Assessment & Plan Note (Signed)
Minimal improvement with metro gel. Given possible spreading and unclear diagnosis.. Will refer to  Derm. Pt to continue current treatment for now.

## 2015-10-16 ENCOUNTER — Other Ambulatory Visit: Payer: Self-pay | Admitting: *Deleted

## 2015-10-16 ENCOUNTER — Telehealth: Payer: Self-pay | Admitting: Family Medicine

## 2015-10-16 MED ORDER — VENLAFAXINE HCL ER 37.5 MG PO CP24: ORAL_CAPSULE | ORAL | Status: DC

## 2015-10-16 NOTE — Telephone Encounter (Signed)
Pt's rash on her face is still there, it has not gotten much better.  Pt thinks that she received a call, and wants you to be aware rash is not clearing up cb number is [phone].  Thank you

## 2015-10-16 NOTE — Telephone Encounter (Signed)
I did not call pt. Has pt seen derm yet?

## 2015-10-16 NOTE — Telephone Encounter (Signed)
Left message asking pt to call office  °

## 2015-10-17 NOTE — Telephone Encounter (Signed)
Meghan Kerr: Can we get her in sooner there or somewhere else. This rash is on her face and is stress provoking.

## 2015-10-17 NOTE — Telephone Encounter (Signed)
Derm appointment is not until 11/17/2015 at 9:30 am at [facility].

## 2015-10-20 NOTE — Telephone Encounter (Signed)
Spoke with pt. She is going to call [facility] to see if they can see her sooner. Pt stated she woke up this morning with two additional spots on her face and she is worried. Pt will let me know either way what she needs me to do as far as calling somewhere else.

## 2015-10-20 NOTE — Telephone Encounter (Signed)
Pt called back. She is seeing one of the PA's at [facility] tomorrow 10/21/15.

## 2015-10-21 DIAGNOSIS — L219 Seborrheic dermatitis, unspecified: Secondary | ICD-10-CM | POA: Diagnosis not present

## 2015-10-22 NOTE — Telephone Encounter (Signed)
Left message asking pt to call office  °

## 2015-10-23 ENCOUNTER — Telehealth: Payer: Self-pay | Admitting: Family Medicine

## 2015-10-23 NOTE — Telephone Encounter (Signed)
I have not called Ms. Hardin Negus.  Will forward message for Ebony Hail and Rosaria Ferries to see if they contacted her about her referral.

## 2015-10-23 NOTE — Telephone Encounter (Signed)
Spoke to pt. Informed it was not me nor Rosaria Ferries who called her. Pt stated she wasn't going to worry about it. I apologized for any confusion

## 2015-10-23 NOTE — Telephone Encounter (Signed)
Patient returned Donna's call.  Patient can be reached on her cell phone before 1:00.  Call her on the home phone after 1:00.

## 2015-10-24 ENCOUNTER — Telehealth: Payer: Self-pay | Admitting: Family Medicine

## 2015-10-24 ENCOUNTER — Encounter: Payer: Self-pay | Admitting: Family Medicine

## 2015-10-24 NOTE — Telephone Encounter (Signed)
Please call and schedule CPE with fasting labs prior with Dr. Bedsole. 

## 2015-10-24 NOTE — Telephone Encounter (Signed)
Mailed letter to pt Please close

## 2015-10-24 NOTE — Telephone Encounter (Signed)
Darl Householder at 10/24/2015 10:43 AM     Status: Signed       Expand All Collapse All   Mailed letter to pt Please close            Darl Householder at 10/22/2015 11:10 AM     Status: Signed       Expand All Collapse All   Left message asking pt to call office            Darl Householder at 10/16/2015 1:17 PM     Status: Signed       Expand All Collapse All   Left message asking pt to call office             Darl Householder at 09/29/2015 9:33 AM     Status: Signed       Expand All Collapse All   Left message asking pt to call office Dr Diona Browner next cpx appointment 10/7 or 01/02/16            [place] at 09/19/2015 2:33 PM     Status: Signed       Expand All Collapse All   Please call and schedule Medicare Wellness with fasting labs prior with Dr. Diona Browner.

## 2015-11-11 ENCOUNTER — Other Ambulatory Visit: Payer: Self-pay | Admitting: Family Medicine

## 2015-11-11 DIAGNOSIS — Z1231 Encounter for screening mammogram for malignant neoplasm of breast: Secondary | ICD-10-CM

## 2015-11-15 ENCOUNTER — Other Ambulatory Visit: Payer: Self-pay | Admitting: Family Medicine

## 2015-11-15 NOTE — Telephone Encounter (Signed)
Last office visit 10/02/2015.  Last Lipid & A1c 09/09/2014.  No future appointments scheduled.  Refill?

## 2015-11-18 ENCOUNTER — Ambulatory Visit
Admission: RE | Admit: 2015-11-18 | Discharge: 2015-11-18 | Disposition: A | Discharge status: Home / Self Care | Payer: Medicare PPO | Source: Ambulatory Visit | Attending: Family Medicine | Admitting: Family Medicine

## 2015-11-18 ENCOUNTER — Other Ambulatory Visit: Payer: Self-pay | Admitting: Family Medicine

## 2015-11-18 DIAGNOSIS — Z1231 Encounter for screening mammogram for malignant neoplasm of breast: Secondary | ICD-10-CM | POA: Insufficient documentation

## 2015-11-18 NOTE — Telephone Encounter (Signed)
CPE scheduled for 12/01/2015 at 8:15 am with fasting labs appointment 11/28/2015 at 8:25 am.

## 2015-11-18 NOTE — Telephone Encounter (Signed)
Call pt.. Has she had lipids and A1C elsewhere.. If not is she up to date with CPX? Will refill x 1

## 2015-11-27 ENCOUNTER — Telehealth: Payer: Self-pay | Admitting: Family Medicine

## 2015-11-27 DIAGNOSIS — E785 Hyperlipidemia, unspecified: Secondary | ICD-10-CM

## 2015-11-27 DIAGNOSIS — Z1159 Encounter for screening for other viral diseases: Secondary | ICD-10-CM

## 2015-11-27 DIAGNOSIS — M8580 Other specified disorders of bone density and structure, unspecified site: Secondary | ICD-10-CM

## 2015-11-27 DIAGNOSIS — E119 Type 2 diabetes mellitus without complications: Secondary | ICD-10-CM

## 2015-11-27 NOTE — Telephone Encounter (Signed)
-----   Message from Marchia Bond sent at 11/25/2015  2:48 PM EST ----- Regarding: Cpx labs Fri 12/2, need orders, Thanks! :-) Please order  future cpx labs for pt's upcoming lab appt. Thanks Aniceto Boss

## 2015-11-27 NOTE — Telephone Encounter (Signed)
At time of labs..Please let pt know that I have added hep C testing to their routine labs given CDC recommends screening anyone born between 1945-1965 (higher risk population for various reasons) given it is a dormant virus (for 20-30 years) that later can cause liver cancer and liver cirrhosis. Covered by insurance.  If pt refuses, or has had in past or would to discuss further...please cancel and notify me.   

## 2015-11-28 ENCOUNTER — Other Ambulatory Visit (INDEPENDENT_AMBULATORY_CARE_PROVIDER_SITE_OTHER): Payer: Medicare PPO

## 2015-11-28 ENCOUNTER — Other Ambulatory Visit: Payer: Self-pay | Admitting: Family Medicine

## 2015-11-28 DIAGNOSIS — Z1159 Encounter for screening for other viral diseases: Secondary | ICD-10-CM

## 2015-11-28 DIAGNOSIS — E785 Hyperlipidemia, unspecified: Secondary | ICD-10-CM

## 2015-11-28 DIAGNOSIS — M8580 Other specified disorders of bone density and structure, unspecified site: Secondary | ICD-10-CM | POA: Diagnosis not present

## 2015-11-28 DIAGNOSIS — E119 Type 2 diabetes mellitus without complications: Secondary | ICD-10-CM

## 2015-11-28 LAB — COMPREHENSIVE METABOLIC PANEL WITH GFR
ALT: 14 U/L (ref 0–35)
AST: 15 U/L (ref 0–37)
Albumin: 4 g/dL (ref 3.5–5.2)
Alkaline Phosphatase: 85 U/L (ref 39–117)
BUN: 18 mg/dL (ref 6–23)
CO2: 30 meq/L (ref 19–32)
Calcium: 9.5 mg/dL (ref 8.4–10.5)
Chloride: 103 meq/L (ref 96–112)
Creatinine, Ser: 0.78 mg/dL (ref 0.40–1.20)
GFR: 78.47 mL/min
Glucose, Bld: 121 mg/dL — ABNORMAL HIGH (ref 70–99)
Potassium: 4.2 meq/L (ref 3.5–5.1)
Sodium: 141 meq/L (ref 135–145)
Total Bilirubin: 0.5 mg/dL (ref 0.2–1.2)
Total Protein: 6.8 g/dL (ref 6.0–8.3)

## 2015-11-28 LAB — LIPID PANEL
Cholesterol: 164 mg/dL (ref 0–200)
HDL: 53.2 mg/dL
LDL Cholesterol: 94 mg/dL (ref 0–99)
NonHDL: 110.56
Total CHOL/HDL Ratio: 3
Triglycerides: 81 mg/dL (ref 0.0–149.0)
VLDL: 16.2 mg/dL (ref 0.0–40.0)

## 2015-11-28 LAB — VITAMIN D 25 HYDROXY (VIT D DEFICIENCY, FRACTURES): VITD: 23.35 ng/mL — ABNORMAL LOW (ref 30.00–100.00)

## 2015-11-28 LAB — HEMOGLOBIN A1C: Hgb A1c MFr Bld: 6.3 % (ref 4.6–6.5)

## 2015-11-29 LAB — HEPATITIS C ANTIBODY: HCV AB: NEGATIVE

## 2015-12-01 ENCOUNTER — Ambulatory Visit (INDEPENDENT_AMBULATORY_CARE_PROVIDER_SITE_OTHER): Payer: Medicare PPO | Admitting: Family Medicine

## 2015-12-01 ENCOUNTER — Encounter: Payer: Self-pay | Admitting: Family Medicine

## 2015-12-01 VITALS — BP 118/62 | HR 62 | Temp 97.9°F | Ht 65.0 in | Wt 179.2 lb

## 2015-12-01 DIAGNOSIS — E119 Type 2 diabetes mellitus without complications: Secondary | ICD-10-CM | POA: Diagnosis not present

## 2015-12-01 DIAGNOSIS — I10 Essential (primary) hypertension: Secondary | ICD-10-CM

## 2015-12-01 DIAGNOSIS — Z23 Encounter for immunization: Secondary | ICD-10-CM | POA: Diagnosis not present

## 2015-12-01 DIAGNOSIS — M65322 Trigger finger, left index finger: Secondary | ICD-10-CM | POA: Insufficient documentation

## 2015-12-01 DIAGNOSIS — E785 Hyperlipidemia, unspecified: Secondary | ICD-10-CM | POA: Diagnosis not present

## 2015-12-01 DIAGNOSIS — Z7189 Other specified counseling: Secondary | ICD-10-CM | POA: Insufficient documentation

## 2015-12-01 DIAGNOSIS — M19049 Primary osteoarthritis, unspecified hand: Secondary | ICD-10-CM

## 2015-12-01 DIAGNOSIS — Z0000 Encounter for general adult medical examination without abnormal findings: Secondary | ICD-10-CM

## 2015-12-01 DIAGNOSIS — M1990 Unspecified osteoarthritis, unspecified site: Secondary | ICD-10-CM | POA: Diagnosis not present

## 2015-12-01 DIAGNOSIS — M6530 Trigger finger, unspecified finger: Secondary | ICD-10-CM

## 2015-12-01 LAB — HM DIABETES FOOT EXAM

## 2015-12-01 NOTE — Addendum Note (Signed)
Addended by: Eliezer Lofts E on: 12/01/2015 09:23 AM   Modules accepted: SmartSet

## 2015-12-01 NOTE — Addendum Note (Signed)
Addended by: Carter Kitten on: 12/01/2015 09:09 AM   Modules accepted: Orders

## 2015-12-01 NOTE — Assessment & Plan Note (Signed)
Well controlled. Will hold glipizide and follow CBGs off med.

## 2015-12-01 NOTE — Assessment & Plan Note (Signed)
Well controlled. Continue current medication.  

## 2015-12-01 NOTE — Assessment & Plan Note (Signed)
Well controlled. Continue current medication. Encouraged exercise, weight loss, healthy eating habits.  

## 2015-12-01 NOTE — Progress Notes (Signed)
Pre visit review using our clinic review tool, if applicable. No additional management support is needed unless otherwise documented below in the visit note. 

## 2015-12-01 NOTE — Assessment & Plan Note (Signed)
NSAIDs, consider injection if not improving.

## 2015-12-01 NOTE — Progress Notes (Signed)
I have personally reviewed the Medicare Annual Wellness questionnaire and have noted 1. The patient's medical and social history 2. Their use of alcohol, tobacco or illicit drugs 3. Their current medications and supplements 4. The patient's functional ability including ADL's, fall risks, home safety risks and hearing or visual             impairment. 5. Diet and physical activities 6. Evidence for depression or mood disorders 7.         Updated provider list Cognitive evaluation was performed and recorded on pt medicare questionnaire form. The patients weight, height, BMI and visual acuity have been recorded in the chart  I have made referrals, counseling and provided education to the patient based review of the above and I have provided the pt with a written personalized care plan for preventive services.   Rash on face has resolved with continued    Some burning pain in left 5th MCP joint treatment.nt. No redness, no swelling. In past week she has noted some catching.  Wt Readings from Last 3 Encounters:  12/01/15 179 lb 4 oz (81.307 kg)  10/02/15 183 lb 8 oz (83.235 kg)  09/23/15 184 lb (83.462 kg)   Diabetes: Great control with  Glipizide and lifestyle change.Marland Kitchen She wishes to hold glipizide.. She does not take it all the time since she has lost . Lab Results  Component Value Date   HGBA1C 6.3 11/28/2015   Using medications without difficulties:  Hypoglycemic episodes: one and had not skipped a meal. Hyperglycemic episodes:None  Feet problems:None.  Blood Sugars averaging: 90-115 eye exam within last year: yes .  Hypertension: Well controlled on current meds.  BP Readings from Last 3 Encounters:  12/01/15 118/62  10/02/15 144/84  09/23/15 138/76  Using medication without problems or lightheadedness: None  Chest pain with exertion: None  Edema: None  Short of breath: None  Average home BPs: not checking   Wt Readings from Last 3 Encounters:  12/01/15 179 lb  4 oz (81.307 kg)  10/02/15 183 lb 8 oz (83.235 kg)  09/23/15 184 lb (83.462 kg)   Elevated Cholesterol: Well controlled on crestor 5 mg daily, using every other day.  Lab Results  Component Value Date   CHOL 164 11/28/2015   HDL 53.20 11/28/2015   LDLCALC 94 11/28/2015   LDLDIRECT 190.1 07/15/2011   TRIG 81.0 11/28/2015   CHOLHDL 3 11/28/2015  Using medications without problems. Yes.. Some body ache, but better on crestor every other day.  Muscle aches: yes  No exercise, but staying active Diet: portion control, stopped meat. Other complaints:   Vit D low.. Not taking a supplement. Review of Systems  Constitutional: Negative for fever and fatigue.  HENT: Negative for ear pain.  Eyes: Negative for pain.  Respiratory: Negative for chest tightness and shortness of breath.  Cardiovascular: Negative for chest pain, palpitations and leg swelling.  Gastrointestinal: Negative for abdominal pain.  Genitourinary: Negative for dysuria.  Objective:   Physical Exam  Constitutional: Vital signs are normal. She appears well-developed and well-nourished. She is cooperative. Non-toxic appearance. She does not appear ill. No distress.  HENT:  Head: Normocephalic.  Right Ear: Hearing, tympanic membrane, external ear and ear canal normal. Tympanic membrane is not erythematous, not retracted and not bulging.  Left Ear: Hearing, tympanic membrane, external ear and ear canal normal. Tympanic membrane is not erythematous, not retracted and not bulging.  Nose: No mucosal edema or rhinorrhea. Right sinus exhibits no maxillary sinus tenderness and  no frontal sinus tenderness. Left sinus exhibits no maxillary sinus tenderness and no frontal sinus tenderness.  Mouth/Throat: Uvula is midline, oropharynx is clear and moist and mucous membranes are normal.  Eyes: Conjunctivae normal, EOM and lids are normal. Pupils are equal, round, and reactive to light. No foreign bodies found.  Neck:  Trachea normal and normal range of motion. Neck supple. Carotid bruit is not present. No mass and no thyromegaly present.  Cardiovascular: Normal rate, regular rhythm, S1 normal, S2 normal, normal heart sounds, intact distal pulses and normal pulses. Exam reveals no gallop and no friction rub.  No murmur heard.  Pulmonary/Chest: Effort normal and breath sounds normal. Not tachypneic. No respiratory distress. She has no decreased breath sounds. She has no wheezes. She has no rhonchi. She has no rales.  Abdominal: Soft. Normal appearance and bowel sounds are normal. There is no tenderness.  Neurological: She is alert.  Skin: Skin is warm, dry and intact. No rash noted.  Psychiatric: Her speech is normal and behavior is normal. Judgment and thought content normal. Her mood appears not anxious. Cognition and memory are normal. She does not exhibit a depressed mood.  GYN: breast bilateral no masses Pelvic exam: NOT PERFORMED.  MSK: left 5th digit pain at pad at base, no nodule palpated.         Positive grind test bilateral CMC joints  Diabetic foot exam:  Normal inspection  No skin breakdown  Calluses On pads of feet B, no ulcers.  Normal DP pulses  Normal sensation to light touch and monofilament  Nails normal      Assessment & Plan:  The patient's preventative maintenance and recommended screening tests for an annual wellness exam were reviewed in full today.  Brought up to date unless services declined.  Counselled on the importance of diet, exercise, and its role in overall health and mortality.  The patient's FH and SH was reviewed, including their home life, tobacco status, and drug and alcohol status.   Vaccines; Uptodate PNA and Td,  prevnar declined zostavax, due for flu today. DVE/pap:08/2014, on q3 year schedule.Marland Kitchen  DVE every 2 years.  Colon: Date of Last Colonoscopy: 05/28/2007.. In [place] Results: Hyperplastic Polyp, recommended repeat in 5 years. She request  stool ifob instead. Had ifob neg 05/2015. Mammo:11/18/2015 DEXA: osteopenia in 04/2011, repeat in 2017 Hep C: completed.

## 2015-12-01 NOTE — Assessment & Plan Note (Signed)
Bilateral thumbs. Treat with NSAIDS prn.

## 2015-12-01 NOTE — Patient Instructions (Addendum)
Hold glipizide.. Continue follow blood sugars.  Restart if blood sugars FBS consistently above 120.  Look into shingles vaccine.  Stop at lab on way out   Start daily vit D supplement 400 IU 1-2 times a day.

## 2015-12-25 ENCOUNTER — Encounter: Payer: Self-pay | Admitting: Internal Medicine

## 2015-12-25 ENCOUNTER — Ambulatory Visit (INDEPENDENT_AMBULATORY_CARE_PROVIDER_SITE_OTHER): Payer: Medicare PPO | Admitting: Internal Medicine

## 2015-12-25 VITALS — BP 118/70 | HR 78 | Temp 97.8°F | Wt 177.0 lb

## 2015-12-25 DIAGNOSIS — J069 Acute upper respiratory infection, unspecified: Secondary | ICD-10-CM

## 2015-12-25 MED ORDER — AZITHROMYCIN 250 MG PO TABS: ORAL_TABLET | ORAL | Status: DC

## 2015-12-25 MED ORDER — HYDROCODONE-HOMATROPINE 5-1.5 MG/5ML PO SYRP: 5.0000 mL | ORAL_SOLUTION | Freq: Three times a day (TID) | ORAL | Status: DC | PRN

## 2015-12-25 NOTE — Progress Notes (Signed)
Pre visit review using our clinic review tool, if applicable. No additional management support is needed unless otherwise documented below in the visit note. 

## 2015-12-25 NOTE — Patient Instructions (Signed)
Upper Respiratory Infection, Adult Most upper respiratory infections (URIs) are a viral infection of the air passages leading to the lungs. A URI affects the nose, throat, and upper air passages. The most common type of URI is nasopharyngitis and is typically referred to as "the common cold." URIs run their course and usually go away on their own. Most of the time, a URI does not require medical attention, but sometimes a bacterial infection in the upper airways can follow a viral infection. This is called a secondary infection. Sinus and middle ear infections are common types of secondary upper respiratory infections. Bacterial pneumonia can also complicate a URI. A URI can worsen asthma and chronic obstructive pulmonary disease (COPD). Sometimes, these complications can require emergency medical care and may be life threatening.  CAUSES Almost all URIs are caused by viruses. A virus is a type of germ and can spread from one person to another.  RISKS FACTORS You may be at risk for a URI if:   You smoke.   You have chronic heart or lung disease.  You have a weakened defense (immune) system.   You are very young or very old.   You have nasal allergies or asthma.  You work in crowded or poorly ventilated areas.  You work in health care facilities or schools. SIGNS AND SYMPTOMS  Symptoms typically develop 2-3 days after you come in contact with a cold virus. Most viral URIs last 7-10 days. However, viral URIs from the influenza virus (flu virus) can last 14-18 days and are typically more severe. Symptoms may include:   Runny or stuffy (congested) nose.   Sneezing.   Cough.   Sore throat.   Headache.   Fatigue.   Fever.   Loss of appetite.   Pain in your forehead, behind your eyes, and over your cheekbones (sinus pain).  Muscle aches.  DIAGNOSIS  Your health care provider may diagnose a URI by:  Physical exam.  Tests to check that your symptoms are not due to  another condition such as:  Strep throat.  Sinusitis.  Pneumonia.  Asthma. TREATMENT  A URI goes away on its own with time. It cannot be cured with medicines, but medicines may be prescribed or recommended to relieve symptoms. Medicines may help:  Reduce your fever.  Reduce your cough.  Relieve nasal congestion. HOME CARE INSTRUCTIONS   Take medicines only as directed by your health care provider.   Gargle warm saltwater or take cough drops to comfort your throat as directed by your health care provider.  Use a warm mist humidifier or inhale steam from a shower to increase air moisture. This may make it easier to breathe.  Drink enough fluid to keep your urine clear or pale yellow.   Eat soups and other clear broths and maintain good nutrition.   Rest as needed.   Return to work when your temperature has returned to normal or as your health care provider advises. You may need to stay home longer to avoid infecting others. You can also use a face mask and careful hand washing to prevent spread of the virus.  Increase the usage of your inhaler if you have asthma.   Do not use any tobacco products, including cigarettes, chewing tobacco, or electronic cigarettes. If you need help quitting, ask your health care provider. PREVENTION  The best way to protect yourself from getting a cold is to practice good hygiene.   Avoid oral or hand contact with people with cold   symptoms.   Wash your hands often if contact occurs.  There is no clear evidence that vitamin C, vitamin E, echinacea, or exercise reduces the chance of developing a cold. However, it is always recommended to get plenty of rest, exercise, and practice good nutrition.  SEEK MEDICAL CARE IF:   You are getting worse rather than better.   Your symptoms are not controlled by medicine.   You have chills.  You have worsening shortness of breath.  You have brown or red mucus.  You have yellow or brown nasal  discharge.  You have pain in your face, especially when you bend forward.  You have a fever.  You have swollen neck glands.  You have pain while swallowing.  You have white areas in the back of your throat. SEEK IMMEDIATE MEDICAL CARE IF:   You have severe or persistent:  Headache.  Ear pain.  Sinus pain.  Chest pain.  You have chronic lung disease and any of the following:  Wheezing.  Prolonged cough.  Coughing up blood.  A change in your usual mucus.  You have a stiff neck.  You have changes in your:  Vision.  Hearing.  Thinking.  Mood. MAKE SURE YOU:   Understand these instructions.  Will watch your condition.  Will get help right away if you are not doing well or get worse.   This information is not intended to replace advice given to you by your health care provider. Make sure you discuss any questions you have with your health care provider.   Document Released: 06/08/2001 Document Revised: 04/29/2015 Document Reviewed: 03/20/2014 Elsevier Interactive Patient Education 2016 [employer] Inc.  

## 2015-12-25 NOTE — Progress Notes (Signed)
HPI  Pt presents to the clinic today with c/o runny nose, scratchy throat, cough and chest congestion. This started 4 days ago. She is blowing clear mucous out of her nose. The cough is productive of yellow mucous. She denies shortness of breath. She denies fever but has had chills and body aches. She has tried cough syrup OTC with minimal relief. She has no history of allergies or breathing problems. She has not had sick contacts that she is aware of.  Review of Systems      Past Medical History  Diagnosis Date  . Hyperlipidemia   . Hypertension     Family History  Problem Relation Age of Onset  . Diabetes Mother   . Heart disease Mother     cad and chf  . Heart disease Father     heart attack (massive)  . Cancer Maternal Grandmother     COLON  . Stroke Maternal Grandfather   . Diabetes Brother   . Heart disease Brother 41    hert attack  . Heart disease Brother 31    CABG   . Stroke Sister   . Heart disease Sister     VALVE REPLACEMENT  . Breast cancer Cousin 69    Social History   Social History  . Marital Status: Married    Spouse Name: N/A  . Number of Children: N/A  . Years of Education: N/A   Occupational History  . TEACHER     Kindergarten   Social History Main Topics  . Smoking status: Never Smoker   . Smokeless tobacco: Never Used  . Alcohol Use: No  . Drug Use: No  . Sexual Activity: Not on file   Other Topics Concern  . Not on file   Social History Narrative    Allergies  Allergen Reactions  . Amoxicillin     REACTION: rash  . Sulfonamide Derivatives     REACTION: Blisters on leg     Constitutional: Positive headache, fatigue. Denies fever or abrupt weight changes.  HEENT:  Positive runny nose, sore throat. Denies eye redness, eye pain, pressure behind the eyes, facial pain, nasal congestion, ear pain, ringing in the ears, wax buildup, or bloody nose. Respiratory: Positive cough. Denies difficulty breathing or shortness of breath.   Cardiovascular: Denies chest pain, chest tightness, palpitations or swelling in the hands or feet.   No other specific complaints in a complete review of systems (except as listed in HPI above).  Objective:   BP 118/70 mmHg  Pulse 78  Temp(Src) 97.8 F (36.6 C) (Oral)  Wt 177 lb (80.287 kg)  SpO2 98%  Wt Readings from Last 3 Encounters:  12/25/15 177 lb (80.287 kg)  12/01/15 179 lb 4 oz (81.307 kg)  10/02/15 183 lb 8 oz (83.235 kg)     General: Appears her stated age, in NAD. HEENT: Head: normal shape and size, no sinus tenderness noted; Eyes: sclera white, no icterus, conjunctiva pink; Ears: Tm's pink but  intact, normal light reflex; Nose: mucosa pink and moist, septum midline; Throat/Mouth: + PND. Teeth present, mucosa erythematous and moist, no exudate noted, no lesions or ulcerations noted.  Neck: No cervical lymphadenopathy.  Cardiovascular: Normal rate and rhythm. S1,S2 noted.  No murmur, rubs or gallops noted.  Pulmonary/Chest: Normal effort and positive vesicular breath sounds. No respiratory distress. No wheezes, rales or ronchi noted.      Assessment & Plan:   Upper Respiratory Infection:  Likely Viral Get some rest and drink plenty  of water Do salt water gargles for the sore throat Start Ibuprofen and Mucinex OTC every 12 hours for the next 48 hours Written Rx for Azithromax x 5 days and Rx for Hycodan cough syrup to start on Saturday if symptoms persist or worsen  RTC as needed or if symptoms persist.

## 2016-01-30 ENCOUNTER — Ambulatory Visit (INDEPENDENT_AMBULATORY_CARE_PROVIDER_SITE_OTHER): Payer: Medicare Other | Admitting: Family Medicine

## 2016-01-30 ENCOUNTER — Encounter: Payer: Self-pay | Admitting: Family Medicine

## 2016-01-30 VITALS — BP 120/66 | HR 64 | Temp 97.4°F | Ht 65.0 in | Wt 175.2 lb

## 2016-01-30 DIAGNOSIS — N644 Mastodynia: Secondary | ICD-10-CM | POA: Diagnosis not present

## 2016-01-30 DIAGNOSIS — N649 Disorder of breast, unspecified: Secondary | ICD-10-CM

## 2016-01-30 DIAGNOSIS — L988 Other specified disorders of the skin and subcutaneous tissue: Secondary | ICD-10-CM | POA: Insufficient documentation

## 2016-01-30 MED ORDER — MUPIROCIN 2 % EX OINT: 1.0000 "application " | TOPICAL_OINTMENT | Freq: Every day | CUTANEOUS | Status: DC

## 2016-01-30 NOTE — Assessment & Plan Note (Signed)
Most  Likely secondary to skin lesion, but will eval for underlying breast disorder with mammo and [place].

## 2016-01-30 NOTE — Progress Notes (Signed)
   Subjective:    Patient ID: Meghan Kerr, female    DOB: 12/11/1951, 65 y.o.   MRN: [id number]  HPI  65 year old female presents with new onset burning in right breast 3 weeks ago, focally on right breast laterally. No mass, no bruising. She ignored it,  Improved some but never went away.  She noted  red skin lesion on right lateral breast x 1 week. Applying hydrocortisone and metronidazole gel. NOt helping.  No nipple discharge.  No flu like illness. No fever.   10/2015 nml mammogram.  Social History /Family History/Past Medical History reviewed and updated if needed.  No history of breast issues.  Family history: cousin with breast cancer.  Review of Systems  Constitutional: Negative for fever and fatigue.  HENT: Negative for ear pain.   Eyes: Negative for pain.  Respiratory: Negative for chest tightness and shortness of breath.   Cardiovascular: Negative for chest pain, palpitations and leg swelling.  Gastrointestinal: Negative for abdominal pain.  Genitourinary: Negative for dysuria.       Objective:   Physical Exam  Constitutional: Vital signs are normal. She appears well-developed and well-nourished. She is cooperative.  Non-toxic appearance. She does not appear ill. No distress.  HENT:  Head: Normocephalic.  Right Ear: Hearing, tympanic membrane, external ear and ear canal normal. Tympanic membrane is not erythematous, not retracted and not bulging.  Left Ear: Hearing, tympanic membrane, external ear and ear canal normal. Tympanic membrane is not erythematous, not retracted and not bulging.  Nose: No mucosal edema or rhinorrhea. Right sinus exhibits no maxillary sinus tenderness and no frontal sinus tenderness. Left sinus exhibits no maxillary sinus tenderness and no frontal sinus tenderness.  Mouth/Throat: Uvula is midline, oropharynx is clear and moist and mucous membranes are normal.  Eyes: Conjunctivae, EOM and lids are normal. Pupils are equal, round, and  reactive to light. Lids are everted and swept, no foreign bodies found.  Neck: Trachea normal and normal range of motion. Neck supple. Carotid bruit is not present. No thyroid mass and no thyromegaly present.  Cardiovascular: Normal rate, regular rhythm, S1 normal, S2 normal, normal heart sounds, intact distal pulses and normal pulses.  Exam reveals no gallop and no friction rub.   No murmur heard. Pulmonary/Chest: Effort normal and breath sounds normal. No tachypnea. No respiratory distress. She has no decreased breath sounds. She has no wheezes. She has no rhonchi. She has no rales.  Abdominal: Soft. Normal appearance and bowel sounds are normal. There is no tenderness.  Genitourinary: There is breast tenderness. No breast swelling, discharge or bleeding.  Right breast pain in lateral breast at 9 ocolck, also healing skin lesion with central pore and 1 cm of surrounding erythema.  Neurological: She is alert.  Skin: Skin is warm, dry and intact. No rash noted.  Psychiatric: Her speech is normal and behavior is normal. Judgment and thought content normal. Her mood appears not anxious. Cognition and memory are normal. She does not exhibit a depressed mood.          Assessment & Plan:

## 2016-01-30 NOTE — Addendum Note (Signed)
Addended by: Eliezer Lofts E on: 01/30/2016 10:24 AM   Modules accepted: Orders

## 2016-01-30 NOTE — Progress Notes (Signed)
Pre visit review using our clinic review tool, if applicable. No additional management support is needed unless otherwise documented below in the visit note. 

## 2016-01-30 NOTE — Patient Instructions (Signed)
Apply antibiotic ointment daily.  Can do warm compress 1-32 times daily.  Stop at front desk for referral for study.  Call if not improving as expected.

## 2016-01-30 NOTE — Assessment & Plan Note (Signed)
Most likely healing infected foillicle or boil. Will treat with topical mupirocin to cover bacterial infection ongoing.  Can do warm compresses 1-2 times daily.

## 2016-02-11 ENCOUNTER — Other Ambulatory Visit: Payer: Self-pay | Admitting: Family Medicine

## 2016-02-11 ENCOUNTER — Ambulatory Visit
Admission: RE | Admit: 2016-02-11 | Discharge: 2016-02-11 | Disposition: A | Discharge status: Home / Self Care | Payer: Medicare Other | Source: Ambulatory Visit | Attending: Family Medicine | Admitting: Family Medicine

## 2016-02-11 DIAGNOSIS — N649 Disorder of breast, unspecified: Secondary | ICD-10-CM

## 2016-02-11 DIAGNOSIS — L988 Other specified disorders of the skin and subcutaneous tissue: Secondary | ICD-10-CM

## 2016-02-11 DIAGNOSIS — N644 Mastodynia: Secondary | ICD-10-CM | POA: Diagnosis present

## 2016-03-23 ENCOUNTER — Other Ambulatory Visit: Payer: Self-pay | Admitting: Family Medicine

## 2016-05-01 ENCOUNTER — Other Ambulatory Visit: Payer: Self-pay | Admitting: Family Medicine

## 2016-05-17 LAB — HM DIABETES EYE EXAM

## 2016-07-16 ENCOUNTER — Other Ambulatory Visit: Payer: Self-pay | Admitting: *Deleted

## 2016-07-16 MED ORDER — ROSUVASTATIN CALCIUM 5 MG PO TABS: 5.0000 mg | ORAL_TABLET | Freq: Every day | ORAL | Status: DC

## 2016-07-19 ENCOUNTER — Other Ambulatory Visit: Payer: Self-pay | Admitting: *Deleted

## 2016-07-19 MED ORDER — VENLAFAXINE HCL ER 37.5 MG PO CP24: ORAL_CAPSULE | ORAL | 1 refills | Status: DC

## 2016-11-24 ENCOUNTER — Other Ambulatory Visit: Payer: Self-pay | Admitting: Family Medicine

## 2016-11-24 DIAGNOSIS — Z1231 Encounter for screening mammogram for malignant neoplasm of breast: Secondary | ICD-10-CM

## 2016-11-29 ENCOUNTER — Ambulatory Visit
Admission: RE | Admit: 2016-11-29 | Discharge: 2016-11-29 | Disposition: A | Discharge status: Home / Self Care | Payer: Medicare Other | Source: Ambulatory Visit | Attending: Family Medicine | Admitting: Family Medicine

## 2016-11-29 DIAGNOSIS — Z1231 Encounter for screening mammogram for malignant neoplasm of breast: Secondary | ICD-10-CM | POA: Insufficient documentation

## 2016-11-30 ENCOUNTER — Telehealth: Payer: Self-pay | Admitting: Family Medicine

## 2016-11-30 ENCOUNTER — Other Ambulatory Visit (INDEPENDENT_AMBULATORY_CARE_PROVIDER_SITE_OTHER): Payer: Medicare Other

## 2016-11-30 DIAGNOSIS — E782 Mixed hyperlipidemia: Secondary | ICD-10-CM | POA: Diagnosis not present

## 2016-11-30 DIAGNOSIS — E119 Type 2 diabetes mellitus without complications: Secondary | ICD-10-CM

## 2016-11-30 DIAGNOSIS — M8589 Other specified disorders of bone density and structure, multiple sites: Secondary | ICD-10-CM

## 2016-11-30 LAB — COMPREHENSIVE METABOLIC PANEL
ALT: 15 U/L (ref 0–35)
AST: 17 U/L (ref 0–37)
Albumin: 4.3 g/dL (ref 3.5–5.2)
Alkaline Phosphatase: 89 U/L (ref 39–117)
BUN: 17 mg/dL (ref 6–23)
CHLORIDE: 104 meq/L (ref 96–112)
CO2: 29 meq/L (ref 19–32)
CREATININE: 0.72 mg/dL (ref 0.40–1.20)
Calcium: 9.5 mg/dL (ref 8.4–10.5)
GFR: 85.8 mL/min (ref >60.00)
GLUCOSE: 137 mg/dL — AB (ref 70–99)
Potassium: 3.8 mEq/L (ref 3.5–5.1)
SODIUM: 141 meq/L (ref 135–145)
Total Bilirubin: 0.5 mg/dL (ref 0.2–1.2)
Total Protein: 6.7 g/dL (ref 6.0–8.3)

## 2016-11-30 LAB — LIPID PANEL
CHOLESTEROL: 187 mg/dL (ref 0–200)
HDL: 60.5 mg/dL (ref >39.00)
LDL CALC: 112 mg/dL — AB (ref 0–99)
NONHDL: 126.99
Total CHOL/HDL Ratio: 3
Triglycerides: 75 mg/dL (ref 0.0–149.0)
VLDL: 15 mg/dL (ref 0.0–40.0)

## 2016-11-30 LAB — VITAMIN D 25 HYDROXY (VIT D DEFICIENCY, FRACTURES): VITD: 26.76 ng/mL — AB (ref 30.00–100.00)

## 2016-11-30 LAB — HEMOGLOBIN A1C: Hgb A1c MFr Bld: 6.6 % — ABNORMAL HIGH (ref 4.6–6.5)

## 2016-11-30 NOTE — Telephone Encounter (Signed)
-----   Message from Ellamae Sia sent at 11/24/2016 10:22 AM EST ----- Regarding: Lab orders for Tuesday, 12.5.17 Patient is scheduled for CPX labs, please order future labs, Thanks , Karna Christmas

## 2016-12-03 ENCOUNTER — Encounter: Payer: Self-pay | Admitting: Family Medicine

## 2016-12-03 ENCOUNTER — Ambulatory Visit (INDEPENDENT_AMBULATORY_CARE_PROVIDER_SITE_OTHER): Payer: Medicare Other | Admitting: Family Medicine

## 2016-12-03 VITALS — BP 142/80 | HR 62 | Temp 97.5°F | Ht 64.5 in | Wt 168.2 lb

## 2016-12-03 DIAGNOSIS — F331 Major depressive disorder, recurrent, moderate: Secondary | ICD-10-CM

## 2016-12-03 DIAGNOSIS — Z0000 Encounter for general adult medical examination without abnormal findings: Secondary | ICD-10-CM | POA: Diagnosis not present

## 2016-12-03 DIAGNOSIS — L84 Corns and callosities: Secondary | ICD-10-CM | POA: Diagnosis not present

## 2016-12-03 DIAGNOSIS — E119 Type 2 diabetes mellitus without complications: Secondary | ICD-10-CM | POA: Diagnosis not present

## 2016-12-03 DIAGNOSIS — E782 Mixed hyperlipidemia: Secondary | ICD-10-CM

## 2016-12-03 DIAGNOSIS — H53451 Other localized visual field defect, right eye: Secondary | ICD-10-CM | POA: Insufficient documentation

## 2016-12-03 DIAGNOSIS — Z23 Encounter for immunization: Secondary | ICD-10-CM | POA: Diagnosis not present

## 2016-12-03 DIAGNOSIS — I10 Essential (primary) hypertension: Secondary | ICD-10-CM

## 2016-12-03 LAB — HM DIABETES FOOT EXAM

## 2016-12-03 MED ORDER — VENLAFAXINE HCL ER 37.5 MG PO CP24: ORAL_CAPSULE | ORAL | 1 refills | Status: DC

## 2016-12-03 MED ORDER — MUPIROCIN 2 % EX OINT: 1.0000 "application " | TOPICAL_OINTMENT | Freq: Every day | CUTANEOUS | 0 refills | Status: DC

## 2016-12-03 NOTE — Progress Notes (Signed)
Pre visit review using our clinic review tool, if applicable. No additional management support is needed unless otherwise documented below in the visit note. 

## 2016-12-03 NOTE — Patient Instructions (Addendum)
Restart venlafaxine 37.5 mg daily.. Call if mood not ideal in next 3-4 week.. For possible increase.  Make sure to keep upcoming eye exam.  Try to crestor.  Take vit D 3 1-2 times daily. Start baby aspirin daily 81 mg. Plan bone density next year with mammogram.

## 2016-12-03 NOTE — Progress Notes (Signed)
Subjective:    Patient ID: Meghan Kerr, female    DOB: [birth year], 65 y.o.   MRN: [id number]  HPI  I have personally reviewed the Medicare Annual Wellness questionnaire and have noted 1. The patient's medical and social history 2. Their use of alcohol, tobacco or illicit drugs 3. Their current medications and supplements 4. The patient's functional ability including ADL's, fall risks, home safety risks and hearing or visual             impairment. 5. Diet and physical activities 6. Evidence for depression or mood disorders 7.         Updated provider list Cognitive evaluation was performed and recorded on pt medicare questionnaire form. The patients weight, height, BMI and visual acuity have been recorded in the chart  I have made referrals, counseling and provided education to the patient based review of the above and I have provided the pt with a written personalized care plan for preventive services.   Diabetes:   Lab Results  Component Value Date   HGBA1C 6.6 (H) 11/30/2016  Using medications without difficulties: Hypoglycemic episodes:none Hyperglycemic episodes: none Feet problems: none Blood Sugars averaging: FBS 104 Wt Readings from Last 3 Encounters:  12/03/16 168 lb 4 oz (76.3 kg)  01/30/16 175 lb 4 oz (79.5 kg)  12/25/15 177 lb (80.3 kg)  Body mass index is 28.43 kg/m. eye exam within last year: yes  Hypertension:   previously well controlled on benezapril  BP up today given rushing around per pt BP Readings from Last 3 Encounters:  12/03/16 (!) 142/80  01/30/16 120/66  12/25/15 118/70  Using medication without problems or lightheadedness: none Chest pain with exertion: None Edema:none Short of breath:none Average home BPs: not checking Other issues:  Elevated Cholesterol:  High risk for CVD,crestor 5 mg , but not taking daily Lab Results  Component Value Date   CHOL 187 11/30/2016   HDL 60.50 11/30/2016   LDLCALC 112 (H) 11/30/2016   LDLDIRECT  190.1 07/15/2011   TRIG 75.0 11/30/2016   CHOLHDL 3 11/30/2016  Using medications without problems: none Muscle aches: None Diet compliance: modeate Exercise: WALKING Other complaints:  Depression, major, recurrent:  Moderate control.  She has been off for last 2-3 weeks effexor. She feels tired, overwhelmed.  Father in law homeless, health issues.   Caring for grandchildren's.  She has been noting blurred vision in right eye. Lateral half of eye.  Noted in last month. Not sudden onset. No change Has eye exam due on dec 19. New glasses in May 2017.hx of retinal hemorrhage.   Review of Systems  Constitutional: Positive for fatigue. Negative for fever.  HENT: Negative for congestion.   Eyes: Positive for visual disturbance. Negative for pain, discharge and itching.  Respiratory: Negative for cough and shortness of breath.   Cardiovascular: Negative for chest pain, palpitations and leg swelling.  Gastrointestinal: Negative for abdominal pain.  Genitourinary: Negative for dysuria and vaginal bleeding.  Musculoskeletal: Positive for back pain.  Neurological: Negative for syncope, light-headedness and headaches.  Psychiatric/Behavioral: Negative for dysphoric mood.       Objective:   Physical Exam  Constitutional: Vital signs are normal. She appears well-developed and well-nourished. She is cooperative.  Non-toxic appearance. She does not appear ill. No distress.  HENT:  Head: Normocephalic.  Right Ear: Hearing, tympanic membrane, external ear and ear canal normal.  Left Ear: Hearing, tympanic membrane, external ear and ear canal normal.  Nose: Nose normal.  Eyes: Conjunctivae,  EOM and lids are normal. Pupils are equal, round, and reactive to light. Lids are everted and swept, no foreign bodies found.  Neck: Trachea normal and normal range of motion. Neck supple. Carotid bruit is not present. No thyroid mass and no thyromegaly present.  Cardiovascular: Normal rate, regular  rhythm, S1 normal, S2 normal, normal heart sounds and intact distal pulses.  Exam reveals no gallop.   No murmur heard. Pulmonary/Chest: Effort normal and breath sounds normal. No respiratory distress. She has no wheezes. She has no rhonchi. She has no rales.  Abdominal: Soft. Normal appearance and bowel sounds are normal. She exhibits no distension, no fluid wave, no abdominal bruit and no mass. There is no hepatosplenomegaly. There is no tenderness. There is no rebound, no guarding and no CVA tenderness. No hernia.  Lymphadenopathy:    She has no cervical adenopathy.    She has no axillary adenopathy.  Neurological: She is alert. She has normal strength. No cranial nerve deficit or sensory deficit.  Skin: Skin is warm, dry and intact. No rash noted.  Sore callus/corn on left 2 nd digit where great toe rubs  Psychiatric: Her speech is normal and behavior is normal. Judgment normal. Her mood appears not anxious. Cognition and memory are normal. She does not exhibit a depressed mood.          Assessment & Plan:  The patient's preventative maintenance and recommended screening tests for an annual wellness exam were reviewed in full today. Brought up to date unless services declined.  Counselled on the importance of diet, exercise, and its role in overall health and mortality. The patient's FH and SH was reviewed, including their home life, tobacco status, and drug and alcohol status.   Vaccines; Uptodate PNA and Td,  prevnar, declined zostavax, due for flu today. DVE/pap:pap not indicated, DVE every 2 years.  Colon: Date of Last Colonoscopy: 05/28/2007.. In [place] Results: Hyperplastic Polyp, recommended repeat in 5 years. She request stool ifob instead. Had ifob neg 05/2015. Plan cologuard. Mammo:11/29/2016 nml, repeat every 2 year. DEXA: osteopenia in 04/2011, repeat in 2017 Hep C: completed. Marland Kitchen

## 2016-12-07 NOTE — Assessment & Plan Note (Signed)
Treat with pad to prevent rubbing.

## 2016-12-07 NOTE — Assessment & Plan Note (Signed)
Well controlled on current regimen.Encouraged exercise, weight loss, healthy eating habits.  

## 2016-12-07 NOTE — Assessment & Plan Note (Signed)
Inadequate control.  Restart effexor.

## 2016-12-07 NOTE — Assessment & Plan Note (Signed)
INcrease crestor to taking daily.

## 2016-12-07 NOTE — Assessment & Plan Note (Signed)
No clear changes in eye exam today. Recommend full eye exam ASAP.

## 2016-12-07 NOTE — Assessment & Plan Note (Signed)
Slight elevation on benazapril.  Per pt rushing around this moreing. Follow at home. Call if > 140/90 consistently.

## 2016-12-14 LAB — HM DIABETES EYE EXAM

## 2017-02-05 ENCOUNTER — Other Ambulatory Visit: Payer: Self-pay | Admitting: Family Medicine

## 2017-02-11 ENCOUNTER — Telehealth: Payer: Self-pay

## 2017-02-11 NOTE — Telephone Encounter (Signed)
Pt was bitten on end of finger by granddaughters pit bull puppy;the pit bull has had its first shots;  the puppy has also killed one of pts dogs. The pit bull is quarantined at [facility]. The vet recommended pt to be seen and documented that puppy is aggressive. Pt has not had tetanus since 2007. Advised pt she will need tetanus shot as well. Pt voiced understanding. No available appts at [facility] or [place]. Offered pt appt at [facility] in [place] but pt does not drive in [place]. Pt said she would go to walk in clinic.FYI to Dr Diona Browner.

## 2017-04-13 LAB — COLOGUARD: Cologuard: NEGATIVE

## 2017-04-22 ENCOUNTER — Encounter: Payer: Self-pay | Admitting: Family Medicine

## 2017-05-03 ENCOUNTER — Other Ambulatory Visit: Payer: Self-pay | Admitting: Family Medicine

## 2017-05-03 NOTE — Telephone Encounter (Signed)
Last office visit 12/03/2016.  Last refilled 09/24/2015 for 45 g with 3 refills.  Ok to refill?

## 2017-05-04 ENCOUNTER — Ambulatory Visit (INDEPENDENT_AMBULATORY_CARE_PROVIDER_SITE_OTHER): Payer: Medicare Other | Admitting: Podiatry

## 2017-05-04 ENCOUNTER — Encounter: Payer: Self-pay | Admitting: Podiatry

## 2017-05-04 ENCOUNTER — Ambulatory Visit (INDEPENDENT_AMBULATORY_CARE_PROVIDER_SITE_OTHER): Payer: Medicare Other

## 2017-05-04 DIAGNOSIS — M79672 Pain in left foot: Secondary | ICD-10-CM

## 2017-05-04 DIAGNOSIS — M2042 Other hammer toe(s) (acquired), left foot: Secondary | ICD-10-CM

## 2017-05-04 NOTE — Progress Notes (Signed)
   Subjective:    Patient ID: Meghan Kerr, female    DOB: 1951-05-16, 66 y.o.   MRN: [id number]  HPI: She presents today with chief complaint of a painful second toe and metatarsophalangeal joint of the left foot. She states that she is getting burning and stinging over the past several months at the level of the second MTPJ. She states that initially started out just a mild separation between her second and third toes but now she has a large separation and overlapping of the second toe on the hallux. She states this is becoming very comparable hardware shoe gear and she's concerned that this may result in a worse deformity. She denies any trauma.    Review of Systems  Eyes: Positive for visual disturbance.  Musculoskeletal: Positive for arthralgias.  All other systems reviewed and are negative.      Objective:   Physical Exam: Vital signs are stable to alert and oriented 3. Pulses are palpable. Neurologic sensorium is intact. Deep tendon reflexes are intact. Muscles and +5 over 5 was flexed plantar flexors and inverters everters all into the musculature is intact. Orthopedic evaluation demonstrates hammertoe deformity with medial deviation of the second toe of the left foot. There is some mild medial deviation of the third toe as well. With plantar flexion of the toe there is significant medial deviation and dislocate patient and subluxation of the second metatarsophalangeal joint. She has minimal hallux interphalangeal joint deviation. Radiographs taken today in the office do demonstrate elongated second and third metatarsals and no bunion deformity. Hammertoe deformity with medial dislocation of the second digit from the elongated second metatarsal is obvious. No fractures are identified. Cutaneous evaluation does not demonstrate any open wounds or lesions.        Assessment & Plan:  Capsulitis hammertoe deformity dislocation of the second metatarsophalangeal joint plantar flex  elongated second metatarsals #2 #3 of the left foot.  Plan: We discussed the etiology pathology conservative versus surgical therapies. She will like to have this surgically corrected in the near future. We went over consent form today line by line number by number giving her ample time to ask questions she saw fit regarding the second and third metatarsal osteotomy with a second and third hammertoe repair with screws or pins. We discussed all the possible postop complications which may include but are not limited to postop pain bleeding swelling infection recurrence and need for further surgery overcorrection under correction. We dispensed Cam Walker for postoperative recovery today provided her with both oral and written home going instructions for the surgery center as well as anesthesia group. I will follow-up with her at the time of surgery in August.

## 2017-05-04 NOTE — Patient Instructions (Signed)

## 2017-05-11 ENCOUNTER — Telehealth: Payer: Self-pay | Admitting: Podiatry

## 2017-05-11 NOTE — Telephone Encounter (Signed)
Pt called asking for you to call to schedule surgery.

## 2017-05-12 ENCOUNTER — Telehealth: Payer: Self-pay

## 2017-05-12 NOTE — Telephone Encounter (Signed)
Erin or linda or mandy can you help?

## 2017-05-12 NOTE — Telephone Encounter (Signed)
I am returning your call.  You wanted to schedule surgery with Dr. Milinda Pointer?  "Yes, Dr. Milinda Pointer suggested I do it after my daughter's wedding.  So, I was looking at August 24."  August 24 is available, I will get it scheduled."  Is there anything I need to do?"  You will need to register with the surgical center, instructions are in the brochure that was given to you.

## 2017-05-12 NOTE — Telephone Encounter (Signed)
Patient called states she received two phone calls one from [facility] and one from [facility], she said she knows how/when to take her meds. She said she has called [employer] to stop them from calling her she would like to know how to stop [facility] from calling her. Requests a call back.

## 2017-05-19 ENCOUNTER — Telehealth: Payer: Self-pay

## 2017-05-19 NOTE — Telephone Encounter (Signed)
Meghan Kerr:  Please call patient and explain that we have no contact or control over the med check company.  Our recommendation would be for her to inform the company to remove her from their call list as this is a [employer] type company advertising their services.  We are sorry that she is getting these calls and I am sure they are annoying.    Thanks.

## 2017-05-19 NOTE — Telephone Encounter (Signed)
Pt left v/m; pt is scheduled to have foot surgery on 08/19/17 with Dr Milinda Pointer; pt wants to know if pt needs to have any changes with meds or blood testing done prior to foot surgery; if pt needs appt call pt to schedule. Pt last seen 12/03/16 for annual.

## 2017-05-20 NOTE — Telephone Encounter (Signed)
Mrs. Feeley notified as instructed by telephone.

## 2017-05-20 NOTE — Telephone Encounter (Signed)
No med changes or labs needed prior to foot surgery.

## 2017-05-20 NOTE — Telephone Encounter (Signed)
Pt was notified, pt verbalized understanding.   

## 2017-06-16 ENCOUNTER — Other Ambulatory Visit: Payer: Self-pay | Admitting: Family Medicine

## 2017-06-21 ENCOUNTER — Encounter: Payer: Self-pay | Admitting: Family Medicine

## 2017-06-21 ENCOUNTER — Ambulatory Visit (INDEPENDENT_AMBULATORY_CARE_PROVIDER_SITE_OTHER): Payer: Medicare Other | Admitting: Family Medicine

## 2017-06-21 VITALS — BP 118/70 | HR 60 | Temp 97.7°F | Ht 64.5 in | Wt 164.2 lb

## 2017-06-21 DIAGNOSIS — R6881 Early satiety: Secondary | ICD-10-CM

## 2017-06-21 DIAGNOSIS — F331 Major depressive disorder, recurrent, moderate: Secondary | ICD-10-CM

## 2017-06-21 DIAGNOSIS — R1013 Epigastric pain: Secondary | ICD-10-CM

## 2017-06-21 LAB — CBC WITH DIFFERENTIAL/PLATELET
BASOS PCT: 0.4 % (ref 0.0–3.0)
Basophils Absolute: 0 10*3/uL (ref 0.0–0.1)
EOS ABS: 0.1 10*3/uL (ref 0.0–0.7)
Eosinophils Relative: 1.4 % (ref 0.0–5.0)
HEMATOCRIT: 40.5 % (ref 36.0–46.0)
Hemoglobin: 13.8 g/dL (ref 12.0–15.0)
Lymphocytes Relative: 21.5 % (ref 12.0–46.0)
Lymphs Abs: 1.6 10*3/uL (ref 0.7–4.0)
MCHC: 34 g/dL (ref 30.0–36.0)
MCV: 90 fl (ref 78.0–100.0)
MONO ABS: 0.6 10*3/uL (ref 0.1–1.0)
Monocytes Relative: 8.4 % (ref 3.0–12.0)
NEUTROS ABS: 5.1 10*3/uL (ref 1.4–7.7)
Neutrophils Relative %: 68.3 % (ref 43.0–77.0)
PLATELETS: 312 10*3/uL (ref 150.0–400.0)
RBC: 4.5 Mil/uL (ref 3.87–5.11)
RDW: 13.1 % (ref 11.5–15.5)
WBC: 7.5 10*3/uL (ref 4.0–10.5)

## 2017-06-21 LAB — COMPREHENSIVE METABOLIC PANEL
ALT: 12 U/L (ref 0–35)
AST: 14 U/L (ref 0–37)
Albumin: 4 g/dL (ref 3.5–5.2)
Alkaline Phosphatase: 70 U/L (ref 39–117)
BUN: 14 mg/dL (ref 6–23)
CHLORIDE: 102 meq/L (ref 96–112)
CO2: 32 meq/L (ref 19–32)
CREATININE: 0.72 mg/dL (ref 0.40–1.20)
Calcium: 9.7 mg/dL (ref 8.4–10.5)
GFR: 85.65 mL/min (ref >60.00)
Glucose, Bld: 112 mg/dL — ABNORMAL HIGH (ref 70–99)
Potassium: 3.9 mEq/L (ref 3.5–5.1)
SODIUM: 139 meq/L (ref 135–145)
Total Bilirubin: 0.4 mg/dL (ref 0.2–1.2)
Total Protein: 6.4 g/dL (ref 6.0–8.3)

## 2017-06-21 LAB — LIPASE: LIPASE: 28 U/L (ref 11.0–59.0)

## 2017-06-21 MED ORDER — VENLAFAXINE HCL ER 75 MG PO CP24: ORAL_CAPSULE | ORAL | 3 refills | Status: DC

## 2017-06-21 NOTE — Assessment & Plan Note (Signed)
Start PPI daily , if not improving will increase to 40 mg daily.  Eval with labs to eval for pancreatitis, liver issue, check cbc.

## 2017-06-21 NOTE — Patient Instructions (Addendum)
Continue nexium 20 mg daily.   Avoid acidic foods (tomato, citrus), soda, alcohol, spicy foods, caffeine, chocolate.   Please stop at the lab to set up to have labs drawn.  Increase venlafaxine to 75 mg daily.

## 2017-06-21 NOTE — Assessment & Plan Note (Signed)
Worsened control.. Increase venlafaxine to 75 mg daily.  Follow up in 4 weeks. MAy be contributing to GI symptoms.

## 2017-06-21 NOTE — Progress Notes (Signed)
   Subjective:    Patient ID: Meghan Kerr, female    DOB: 05-Jan-1951, 66 y.o.   MRN: [id number]  HPI   66 year old female with history of well controlled DM and GERD presents for new onset  Epigastric pain and burning, feels full, early satiety.  Ongoing x 3-4 months. Burning in stomach  woith particular foods.  Using nexium off and on.  no RUQ pain, no back pain  Using probiotics in last week... Has helped some with bloating.  She reports that  Since 01/2017 she has had 2 dogs die and sister passed away for dementia, FTT, metastasized cancer.  She has been having worsening mood, feeling overwhelmed. Has not effected her ability to do things, no issues with sleep.  No SI, no hallucinations.  She is still on venlafaxine 37.5 mg. Has lost weight.. but  Not eating as much given diet and grandkids. Wt Readings from Last 3 Encounters:  06/21/17 164 lb 4 oz (74.5 kg)  12/03/16 168 lb 4 oz (76.3 kg)  01/30/16 175 lb 4 oz (79.5 kg)    Blood pressure 118/70, pulse 60, temperature 97.7 F (36.5 C), temperature source Oral, height 5' 4.5" (1.638 m), weight 164 lb 4 oz (74.5 kg).  Review of Systems  Constitutional: Negative for fatigue and fever.  HENT: Negative for ear pain.   Eyes: Negative for pain.  Respiratory: Negative for chest tightness and shortness of breath.   Cardiovascular: Negative for chest pain, palpitations and leg swelling.  Gastrointestinal: Positive for abdominal pain and nausea. Negative for blood in stool, constipation and vomiting.  Genitourinary: Negative for dysuria.       Objective:   Physical Exam  Constitutional: Vital signs are normal. She appears well-developed and well-nourished. She is cooperative.  Non-toxic appearance. She does not appear ill. No distress.  HENT:  Head: Normocephalic.  Right Ear: Hearing, tympanic membrane, external ear and ear canal normal. Tympanic membrane is not erythematous, not retracted and not bulging.  Left Ear: Hearing,  tympanic membrane, external ear and ear canal normal. Tympanic membrane is not erythematous, not retracted and not bulging.  Nose: No mucosal edema or rhinorrhea. Right sinus exhibits no maxillary sinus tenderness and no frontal sinus tenderness. Left sinus exhibits no maxillary sinus tenderness and no frontal sinus tenderness.  Mouth/Throat: Uvula is midline, oropharynx is clear and moist and mucous membranes are normal.  Eyes: Conjunctivae, EOM and lids are normal. Pupils are equal, round, and reactive to light. Lids are everted and swept, no foreign bodies found.  Neck: Trachea normal and normal range of motion. Neck supple. Carotid bruit is not present. No thyroid mass and no thyromegaly present.  Cardiovascular: Normal rate, regular rhythm, S1 normal, S2 normal, normal heart sounds, intact distal pulses and normal pulses.  Exam reveals no gallop and no friction rub.   No murmur heard. Pulmonary/Chest: Effort normal and breath sounds normal. No tachypnea. No respiratory distress. She has no decreased breath sounds. She has no wheezes. She has no rhonchi. She has no rales.  Abdominal: Soft. Normal appearance and bowel sounds are normal. There is no tenderness.  Neurological: She is alert.  Skin: Skin is warm, dry and intact. No rash noted.  Psychiatric: Her speech is normal and behavior is normal. Judgment and thought content normal. Her mood appears not anxious. Cognition and memory are normal. She exhibits a depressed mood.          Assessment & Plan:

## 2017-06-22 ENCOUNTER — Encounter: Payer: Self-pay | Admitting: *Deleted

## 2017-08-16 ENCOUNTER — Other Ambulatory Visit: Payer: Self-pay | Admitting: Podiatry

## 2017-08-16 MED ORDER — PROMETHAZINE HCL 25 MG PO TABS: 25.0000 mg | ORAL_TABLET | Freq: Three times a day (TID) | ORAL | 0 refills | Status: DC | PRN

## 2017-08-16 MED ORDER — OXYCODONE-ACETAMINOPHEN 10-325 MG PO TABS: 1.0000 | ORAL_TABLET | ORAL | 0 refills | Status: DC | PRN

## 2017-08-16 MED ORDER — CLINDAMYCIN HCL 150 MG PO CAPS: 150.0000 mg | ORAL_CAPSULE | Freq: Three times a day (TID) | ORAL | 0 refills | Status: DC

## 2017-08-19 ENCOUNTER — Encounter: Payer: Self-pay | Admitting: Podiatry

## 2017-08-19 DIAGNOSIS — M21542 Acquired clubfoot, left foot: Secondary | ICD-10-CM | POA: Diagnosis not present

## 2017-08-19 DIAGNOSIS — M2042 Other hammer toe(s) (acquired), left foot: Secondary | ICD-10-CM | POA: Diagnosis not present

## 2017-08-19 HISTORY — PX: HAMMER TOE SURGERY: SHX385

## 2017-08-24 ENCOUNTER — Ambulatory Visit (INDEPENDENT_AMBULATORY_CARE_PROVIDER_SITE_OTHER): Payer: Self-pay | Admitting: Podiatry

## 2017-08-24 ENCOUNTER — Ambulatory Visit (INDEPENDENT_AMBULATORY_CARE_PROVIDER_SITE_OTHER): Payer: Medicare Other

## 2017-08-24 VITALS — BP 119/76 | HR 66 | Temp 98.5°F | Resp 16

## 2017-08-24 DIAGNOSIS — M2042 Other hammer toe(s) (acquired), left foot: Secondary | ICD-10-CM | POA: Diagnosis not present

## 2017-08-24 NOTE — Progress Notes (Signed)
She presents today status post second and third metatarsal osteotomies hammertoe repair #2 #3 of the left foot. Pin is in place of the second toe. Sutures are placed in the distal aspect of the toe. States that she is doing very well she takes ibuprofen and only had to take a few of the Percocet. She denies calf pain chest pain shortness of breath.  Objective: Vital signs are stable she's alert and 3 dry sterile dressing was removed demonstrates palpable pulses minimal edema mild erythema surrounding the surgical site in toto were of a inflammation rather than cellulitic process  Radiographs taken today demonstrate L osteotomies #2 #3 with internal fixation to toes #2 #3 intact and in good position. I see no fractures or signs of trauma.  Assessment: Well-healing surgical foot left 1 week.  Plan: Redressed today. After compressive dressing follow-up with [place] in 1 week continue use of the Cam Walker keep his foot elevated dry and clean follow-up with me in a week

## 2017-09-02 ENCOUNTER — Encounter: Payer: Self-pay | Admitting: Podiatry

## 2017-09-02 ENCOUNTER — Ambulatory Visit (INDEPENDENT_AMBULATORY_CARE_PROVIDER_SITE_OTHER): Payer: Self-pay | Admitting: Podiatry

## 2017-09-02 ENCOUNTER — Ambulatory Visit (INDEPENDENT_AMBULATORY_CARE_PROVIDER_SITE_OTHER): Payer: Medicare Other

## 2017-09-02 DIAGNOSIS — Z9889 Other specified postprocedural states: Secondary | ICD-10-CM | POA: Diagnosis not present

## 2017-09-05 ENCOUNTER — Telehealth: Payer: Self-pay | Admitting: *Deleted

## 2017-09-05 NOTE — Telephone Encounter (Signed)
[facility] stating patient request new Rx for Lisinopril/HCTZ.  Cost Less.  Please advise.  Ok to change medication?

## 2017-09-05 NOTE — Progress Notes (Signed)
   Subjective:  Patient presents today status post left foot surgery. DOS: 08/19/17. She states she is doing well but reports some swelling of the toes of the left foot. She states she bumped the left foot on the edge of her bed three nights ago. She reports an aching sensation to the LLE and notices it after sitting for long periods of time. She is here for further evaluation and treatment.      Objective/Physical Exam Skin incisions appear to be well coapted with sutures and staples intact. No sign of infectious process noted. No dehiscence. No active bleeding noted. Moderate edema noted to the surgical extremity.  Radiographic Exam:  Orthopedic hardware and osteotomies sites appear to be stable with routine healing.  Assessment: 1. s/p left foot surgery. DOS: 08/19/17.   Plan of Care:  1. Patient was evaluated. X-rays reviewed 2. Sutures removed. Dressing changed. Keep CDI for an additional week. 3. Continue wearing CAM boot. 4. Return to clinic in 1 week with Dr. Milinda Pointer.   Edrick Kins, DPM [facility]  Dr. Edrick Kins, [address]                                        [place], [place] [address]                Office [phone]  Fax [phone]

## 2017-09-06 MED ORDER — LISINOPRIL-HYDROCHLOROTHIAZIDE 20-12.5 MG PO TABS: 0.5000 | ORAL_TABLET | Freq: Every day | ORAL | 3 refills | Status: DC
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
## 2017-09-06 NOTE — Telephone Encounter (Signed)
It is up to her whether she changes or not. I sent in rx already. Can D/C order if she would prefer.

## 2017-09-06 NOTE — Telephone Encounter (Addendum)
Left message for Meghan Kerr that it is up to her if she wants to change BP medication per Dr. Diona Browner.  I ask that she just let us know if we need to cancel this Lisinopril-HCTZ and resend in [place].

## 2017-09-06 NOTE — Telephone Encounter (Signed)
SenBP at home to make sure new med controls BP as well as old.

## 2017-09-07 NOTE — Telephone Encounter (Addendum)
Called and verified with Melah that she really meant she wanted to stay on the [place].  Lashell confirmed.  [facility] and [facility] for the Lisinopril-HCTZ.  Cheyanna states she doesn't currently need refills because she has plenty of the Benazepril-HCTZ.  Medication list updated.

## 2017-09-07 NOTE — Telephone Encounter (Signed)
Patient returned Donna's call.  Patient wants to stay on Lisinopril-HCTZ.

## 2017-09-07 NOTE — Addendum Note (Signed)
Addended by: Carter Kitten on: 09/07/2017 10:05 AM   Modules accepted: Orders

## 2017-09-08 ENCOUNTER — Ambulatory Visit (INDEPENDENT_AMBULATORY_CARE_PROVIDER_SITE_OTHER): Payer: Self-pay | Admitting: Podiatry

## 2017-09-08 ENCOUNTER — Encounter: Payer: Self-pay | Admitting: Podiatry

## 2017-09-08 DIAGNOSIS — M216X2 Other acquired deformities of left foot: Secondary | ICD-10-CM

## 2017-09-08 DIAGNOSIS — M2042 Other hammer toe(s) (acquired), left foot: Secondary | ICD-10-CM

## 2017-09-08 DIAGNOSIS — L03032 Cellulitis of left toe: Secondary | ICD-10-CM

## 2017-09-08 DIAGNOSIS — L02612 Cutaneous abscess of left foot: Secondary | ICD-10-CM

## 2017-09-08 MED ORDER — DOXYCYCLINE HYCLATE 100 MG PO TABS: 100.0000 mg | ORAL_TABLET | Freq: Two times a day (BID) | ORAL | 0 refills | Status: DC

## 2017-09-08 NOTE — Progress Notes (Signed)
She presents today states that her surgical foot left is in extreme pain. She states that he has decreased considerably since she was in last. She states it is hurting yesterday all way up to her hip but she is wanted me to check it now causes feeling much better.  When she was in last week she told Dr. Amalia Hailey that she bumped her foot on the bed post. She tells me that she did not have any injuries. Her husband speaks up and states that she bumped her toes on the bedpost and walked without her boot on her foot. He states that she is very hard head.  Objective: Vital signs are stable alert and oriented 3 moderate edema to the left foot today K wire to the second toe is pushed into the toe and is exquisitely painful. I'm able to withdraw the K wire partially from the toe in place padding after I took a sample of the drainage coming from the surgical site. This will be sent for culture and sensitivity. I redressed the foot.  Assessment: Noncompliance postop foot left. Infection second toe.  Plan: Put her on doxycycline and a sample of the culture and sensitivity today redressed the foot and I will follow-up with her in 1-2 weeks.

## 2017-09-11 LAB — WOUND CULTURE
MICRO NUMBER: 81012261
SPECIMEN QUALITY:: ADEQUATE

## 2017-09-12 ENCOUNTER — Encounter: Payer: Medicare Other | Admitting: Podiatry

## 2017-09-13 ENCOUNTER — Ambulatory Visit (INDEPENDENT_AMBULATORY_CARE_PROVIDER_SITE_OTHER): Payer: Medicare Other | Admitting: Family Medicine

## 2017-09-13 ENCOUNTER — Encounter: Payer: Self-pay | Admitting: Family Medicine

## 2017-09-13 DIAGNOSIS — N644 Mastodynia: Secondary | ICD-10-CM | POA: Insufficient documentation

## 2017-09-13 DIAGNOSIS — R1031 Right lower quadrant pain: Secondary | ICD-10-CM

## 2017-09-13 NOTE — Progress Notes (Signed)
Subjective:    Patient ID: Meghan Kerr, female    DOB: 1951-09-09, 66 y.o.   MRN: [id number]  HPI    66 year old female presents with new onset stinging pain in left breast and right hip pain.  1. Pain in right hip, right abdominal pain:  3 week, no proceeding fall or change in activity. Hot stinging sensation.  Off and on.. Last 7-8 seconds,  Triggered sometimes with reaching across  no sweeling, no rash, no redness.  Full ROM of hip. Did have constipation couple weeks, ago but resolve now. Using ibuprofen for foot.. May help some.   Hx of appendectomy and ectopic pregnancy.. Possibly one ovary removed.. Not sure which.  2. Left breast pain: improving.  Sore spot presents 3 weeks Lateral, feels small area size of pea. Improved significantly No skin changes, no nipple changes.  Last mammo: 11/2016 neg  3.: Had hammer toe surgery.  Dr. Milinda Pointer.  Bandaging has ridden up some and she wishes for [place] to replace it.  Wt Readings from Last 3 Encounters:  09/13/17 167 lb (75.8 kg)  06/21/17 164 lb 4 oz (74.5 kg)  12/03/16 168 lb 4 oz (76.3 kg)    Sister with extensive metastatic abdominal cancer, unknown primary in last year deceased.  Review of Systems  Constitutional: Positive for unexpected weight change. Negative for fatigue and fever.       She has lost weight without trying per pt ( not any per our measurements  HENT: Negative for ear pain.   Eyes: Negative for pain.  Respiratory: Negative for chest tightness and shortness of breath.   Cardiovascular: Negative for chest pain, palpitations and leg swelling.  Gastrointestinal: Negative for abdominal pain.  Genitourinary: Negative for dysuria.       Objective:   Physical Exam  Constitutional: Vital signs are normal. She appears well-developed and well-nourished. She is cooperative.  Non-toxic appearance. She does not appear ill. No distress.  HENT:  Head: Normocephalic.  Right Ear: Hearing, tympanic membrane, external  ear and ear canal normal. Tympanic membrane is not erythematous, not retracted and not bulging.  Left Ear: Hearing, tympanic membrane, external ear and ear canal normal. Tympanic membrane is not erythematous, not retracted and not bulging.  Nose: No mucosal edema or rhinorrhea. Right sinus exhibits no maxillary sinus tenderness and no frontal sinus tenderness. Left sinus exhibits no maxillary sinus tenderness and no frontal sinus tenderness.  Mouth/Throat: Uvula is midline, oropharynx is clear and moist and mucous membranes are normal.  Eyes: Pupils are equal, round, and reactive to light. Conjunctivae, EOM and lids are normal. Lids are everted and swept, no foreign bodies found.  Neck: Trachea normal and normal range of motion. Neck supple. Carotid bruit is not present. No thyroid mass and no thyromegaly present.  Cardiovascular: Normal rate, regular rhythm, S1 normal, S2 normal, normal heart sounds, intact distal pulses and normal pulses.  Exam reveals no gallop and no friction rub.   No murmur heard. Pulmonary/Chest: Effort normal and breath sounds normal. No tachypnea. No respiratory distress. She has no decreased breath sounds. She has no wheezes. She has no rhonchi. She has no rales.  Abdominal: Soft. Normal appearance and bowel sounds are normal. There is no hepatosplenomegaly. There is tenderness in the right lower quadrant. There is no rigidity, no rebound, no guarding and no CVA tenderness.  Genitourinary: There is breast tenderness.  Genitourinary Comments: Left breast sore at  2 oclock, lateral to nipple. Nodular feeling, fairly symmetric  with other side.  Neurological: She is alert.  Skin: Skin is warm, dry and intact. No rash noted.  Psychiatric: Her speech is normal and behavior is normal. Judgment and thought content normal. Her mood appears not anxious. Cognition and memory are normal. She does not exhibit a depressed mood.          Assessment & Plan:

## 2017-09-13 NOTE — Assessment & Plan Note (Signed)
No mass palpated. Possibly related to recent constipation, resolved now.  No rash suggesting shingles. Will eval with pelvic US.

## 2017-09-13 NOTE — Assessment & Plan Note (Signed)
No clear mass palpated, no skin changes.  Eval with [place] and mammogram.

## 2017-09-13 NOTE — Addendum Note (Signed)
Addended by: Carter Kitten on: 09/13/2017 09:52 AM   Modules accepted: Orders

## 2017-09-13 NOTE — Patient Instructions (Signed)
Please stop at the front desk to set up referral.  

## 2017-09-13 NOTE — Addendum Note (Signed)
Addended by: Carter Kitten on: 09/13/2017 09:34 AM   Modules accepted: Orders

## 2017-09-15 ENCOUNTER — Encounter: Payer: Self-pay | Admitting: Podiatry

## 2017-09-15 ENCOUNTER — Ambulatory Visit (INDEPENDENT_AMBULATORY_CARE_PROVIDER_SITE_OTHER): Payer: Medicare Other | Admitting: Podiatry

## 2017-09-15 ENCOUNTER — Ambulatory Visit (INDEPENDENT_AMBULATORY_CARE_PROVIDER_SITE_OTHER): Payer: Medicare Other

## 2017-09-15 VITALS — BP 140/73 | HR 58 | Temp 96.3°F

## 2017-09-15 DIAGNOSIS — M2042 Other hammer toe(s) (acquired), left foot: Secondary | ICD-10-CM | POA: Diagnosis not present

## 2017-09-15 DIAGNOSIS — M216X2 Other acquired deformities of left foot: Secondary | ICD-10-CM

## 2017-09-15 NOTE — Progress Notes (Signed)
She presents today for postop visit date of surgery 08/19/2017 seconds metatarsal osteotomy third metatarsal osteotomy hammertoe repair with screws #2 and #3: Left foot. She retains a pin to the second digit of the left foot. She states she would like to have the pin removed today she has nearly completed her antibiotic and states that her foot is feeling much better.  Objective: Vital signs are stable she is alert and oriented 3. Pulses are palpable. K wires in place of the second toe much decrease in erythema and edema from previous visit. Lengths and no malodor. Pathology report does demonstrate simple skin flora which should have been taking care of with the antibiotic prescribed.  Assessment: Well-healing surgical foot approximately one month postop.  Plan: We removed the K wire today from the second toe placed her in a compression anklet a Darco shoe and a Darco digital splint to plantar flex the toe. I instructed her not to get this toe wet for the next 2 days and then she may wash it provided there is no drainage from the pin site. Otherwise I will follow up with her in 2-3 weeks

## 2017-09-18 ENCOUNTER — Other Ambulatory Visit: Payer: Self-pay | Admitting: Family Medicine

## 2017-09-19 ENCOUNTER — Ambulatory Visit
Admission: RE | Admit: 2017-09-19 | Discharge: 2017-09-19 | Disposition: A | Discharge status: Home / Self Care | Payer: Medicare Other | Source: Ambulatory Visit | Attending: Family Medicine | Admitting: Family Medicine

## 2017-09-19 DIAGNOSIS — D259 Leiomyoma of uterus, unspecified: Secondary | ICD-10-CM | POA: Insufficient documentation

## 2017-09-19 DIAGNOSIS — R1031 Right lower quadrant pain: Secondary | ICD-10-CM

## 2017-09-20 ENCOUNTER — Other Ambulatory Visit: Payer: Self-pay | Admitting: Family Medicine

## 2017-09-21 ENCOUNTER — Ambulatory Visit
Admission: RE | Admit: 2017-09-21 | Discharge: 2017-09-21 | Disposition: A | Discharge status: Home / Self Care | Payer: Medicare Other | Source: Ambulatory Visit | Attending: Family Medicine | Admitting: Family Medicine

## 2017-09-21 DIAGNOSIS — N644 Mastodynia: Secondary | ICD-10-CM | POA: Diagnosis not present

## 2017-09-23 ENCOUNTER — Other Ambulatory Visit: Payer: Self-pay | Admitting: Family Medicine

## 2017-09-28 ENCOUNTER — Ambulatory Visit (INDEPENDENT_AMBULATORY_CARE_PROVIDER_SITE_OTHER): Payer: Self-pay | Admitting: Podiatry

## 2017-09-28 DIAGNOSIS — M2042 Other hammer toe(s) (acquired), left foot: Secondary | ICD-10-CM

## 2017-09-28 DIAGNOSIS — M216X2 Other acquired deformities of left foot: Secondary | ICD-10-CM

## 2017-09-28 NOTE — Progress Notes (Signed)
She presents today status post second metatarsal osteotomy and hammertoe repair #2 #3 of the left foot. 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 She states that the swelling has really gone down over the past few days.  Objective: Vital signs are stable alert and oriented 3. Pulses are palpable. No open lesions or wounds. Radiographs demonstrate well healing arthrodesis.  Assessment: Well-healing surgical foot.  I will allow her to get back into her regular shoe gear and will follow up with her in 1 month.

## 2017-10-04 NOTE — Progress Notes (Signed)
DOS 08/19/17 Met osteotomy 2,3 mets Lt, hammertoe repair 2,3 toe Lt w internal fixation

## 2017-10-26 ENCOUNTER — Ambulatory Visit (INDEPENDENT_AMBULATORY_CARE_PROVIDER_SITE_OTHER): Payer: Medicare Other

## 2017-10-26 ENCOUNTER — Ambulatory Visit (INDEPENDENT_AMBULATORY_CARE_PROVIDER_SITE_OTHER): Payer: Medicare Other | Admitting: Podiatry

## 2017-10-26 DIAGNOSIS — M2042 Other hammer toe(s) (acquired), left foot: Secondary | ICD-10-CM | POA: Diagnosis not present

## 2017-10-26 DIAGNOSIS — M216X2 Other acquired deformities of left foot: Secondary | ICD-10-CM

## 2017-10-26 DIAGNOSIS — Z23 Encounter for immunization: Secondary | ICD-10-CM | POA: Diagnosis not present

## 2017-10-26 NOTE — Progress Notes (Signed)
She presents today date of surgery 08/19/2017. She is status post second metatarsal and third metatarsal osteotomies left foot. Hammertoe repair #2 #3 of the left foot. States this doing very well but just last week and felt like I felt it cool on the outside of the foot. I don't think is anything important and is doing much better now.  Objective: Vital signs are stable alert and oriented 3. Oral aspect of the foot appears to be normal no swelling or erythema edema. She has good range of motion of the second and third metatarsophalangeal joints. Screws are intact. Toes are rectus and straight.  Assessment: Well-healing surgical foot left.  Plan: Follow up with me in 1 month if necessary. We'll allow her to get back to her regular routine.

## 2017-11-27 ENCOUNTER — Other Ambulatory Visit: Payer: Self-pay | Admitting: Family Medicine

## 2017-12-08 ENCOUNTER — Ambulatory Visit: Payer: Medicare Other | Admitting: Family Medicine

## 2017-12-08 ENCOUNTER — Other Ambulatory Visit: Payer: Self-pay

## 2017-12-08 ENCOUNTER — Encounter: Payer: Self-pay | Admitting: Family Medicine

## 2017-12-08 DIAGNOSIS — B029 Zoster without complications: Secondary | ICD-10-CM | POA: Diagnosis not present

## 2017-12-08 MED ORDER — VALACYCLOVIR HCL 1 G PO TABS: 1000.0000 mg | ORAL_TABLET | Freq: Three times a day (TID) | ORAL | 0 refills | Status: DC

## 2017-12-08 MED ORDER — IBUPROFEN 800 MG PO TABS: 800.0000 mg | ORAL_TABLET | Freq: Three times a day (TID) | ORAL | 0 refills | Status: DC | PRN

## 2017-12-08 NOTE — Patient Instructions (Addendum)
Start ibuprofen 800 mg three times a day ( every 8 hours).  Call if pain is not well controlled.  Complete a course of valacyclovir.

## 2017-12-08 NOTE — Assessment & Plan Note (Signed)
No rash, but otherwise very typical.  Treat with valacyclovir and pain control.

## 2017-12-08 NOTE — Progress Notes (Signed)
   Subjective:    Patient ID: Meghan Kerr, female    DOB: Mar 27, 1951, 66 y.o.   MRN: [id number]  HPI    66 year old female present with new onset rash on right side.  She reports she has noted painful skin area on right.. Started 10 days ago.  No improvement.  She noted red lesions.. No blisters.  No pustule.   She has had laryngitis in last 5 days.  No fever, she has been feeling tired.  She has been using tylenol for pain.Marland Kitchen Helps some.   She had possible shingles in past on face.  She has not had shingles vaccine.  Blood pressure 120/80, pulse 60, temperature (!) 97.5 F (36.4 C), temperature source Oral, height 5' 4.5" (1.638 m), weight 160 lb 8 oz (72.8 kg).    Review of Systems  Constitutional: Negative for fatigue and fever.  HENT: Negative for congestion.   Eyes: Negative for pain.  Respiratory: Negative for cough and shortness of breath.   Cardiovascular: Negative for chest pain, palpitations and leg swelling.  Gastrointestinal: Negative for abdominal pain.  Genitourinary: Negative for dysuria and vaginal bleeding.  Musculoskeletal: Negative for back pain.  Neurological: Negative for syncope, light-headedness and headaches.  Psychiatric/Behavioral: Negative for dysphoric mood.       Objective:   Physical Exam  Constitutional: Vital signs are normal. She appears well-developed and well-nourished. She is cooperative.  Non-toxic appearance. She does not appear ill. No distress.  HENT:  Head: Normocephalic.  Right Ear: Hearing, tympanic membrane, external ear and ear canal normal. Tympanic membrane is not erythematous, not retracted and not bulging.  Left Ear: Hearing, tympanic membrane, external ear and ear canal normal. Tympanic membrane is not erythematous, not retracted and not bulging.  Nose: No mucosal edema or rhinorrhea. Right sinus exhibits no maxillary sinus tenderness and no frontal sinus tenderness. Left sinus exhibits no maxillary sinus tenderness  and no frontal sinus tenderness.  Mouth/Throat: Uvula is midline, oropharynx is clear and moist and mucous membranes are normal.  Eyes: Conjunctivae, EOM and lids are normal. Pupils are equal, round, and reactive to light. Lids are everted and swept, no foreign bodies found.  Neck: Trachea normal and normal range of motion. Neck supple. Carotid bruit is not present. No thyroid mass and no thyromegaly present.  Cardiovascular: Normal rate, regular rhythm, S1 normal, S2 normal, normal heart sounds, intact distal pulses and normal pulses. Exam reveals no gallop and no friction rub.  No murmur heard. Pulmonary/Chest: Effort normal and breath sounds normal. No tachypnea. No respiratory distress. She has no decreased breath sounds. She has no wheezes. She has no rhonchi. She has no rales.  Abdominal: Soft. Normal appearance and bowel sounds are normal. There is no tenderness.  Neurological: She is alert.  Skin: Skin is warm, dry and intact. No rash noted.  Hyperpigmented lesions and excoriations on right lateral torso in dermatome, no vesicles  Psychiatric: Her speech is normal and behavior is normal. Judgment and thought content normal. Her mood appears not anxious. Cognition and memory are normal. She does not exhibit a depressed mood.          Assessment & Plan:

## 2017-12-19 ENCOUNTER — Telehealth: Payer: Self-pay | Admitting: *Deleted

## 2017-12-19 NOTE — Telephone Encounter (Signed)
Let pt know we can make the change but I would recommend atorvastatin over simvastatin given associated side effects.. Is this on cheaper list? Also she will need  3 month follow up chol check after switch to see if it is as effective as crestor in her case. Let me know if she still wishes to proceed.

## 2017-12-19 NOTE — Telephone Encounter (Signed)
Spoke with Mrs. Hardin Negus.   She states she did not contact [facility] to request [facility]., they contacted her.   She states she knows the Crestor 5 mg is working and will continue on that medication at this time.  She states she will call [facility] and let them know.

## 2017-12-19 NOTE — Telephone Encounter (Signed)
Received [facility] from [facility] stating patient request new Rx:  Pt is paying $24 for Crestor 5 mg.  Pt requesting lower-cost alternative: Simvastatin 20 mg $8.

## 2017-12-22 ENCOUNTER — Other Ambulatory Visit: Payer: Self-pay | Admitting: Family Medicine

## 2018-01-10 ENCOUNTER — Other Ambulatory Visit: Payer: Self-pay | Admitting: Family Medicine

## 2018-01-10 DIAGNOSIS — Z1231 Encounter for screening mammogram for malignant neoplasm of breast: Secondary | ICD-10-CM

## 2018-02-07 ENCOUNTER — Ambulatory Visit
Admission: RE | Admit: 2018-02-07 | Discharge: 2018-02-07 | Disposition: A | Discharge status: Home / Self Care | Payer: Medicare Other | Source: Ambulatory Visit | Attending: Family Medicine | Admitting: Family Medicine

## 2018-02-07 DIAGNOSIS — Z1231 Encounter for screening mammogram for malignant neoplasm of breast: Secondary | ICD-10-CM | POA: Diagnosis not present

## 2018-02-25 ENCOUNTER — Other Ambulatory Visit: Payer: Self-pay | Admitting: Family Medicine

## 2018-02-27 ENCOUNTER — Ambulatory Visit (INDEPENDENT_AMBULATORY_CARE_PROVIDER_SITE_OTHER): Payer: Medicare Other

## 2018-02-27 ENCOUNTER — Ambulatory Visit: Payer: Medicare Other | Admitting: Podiatry

## 2018-02-27 ENCOUNTER — Encounter: Payer: Self-pay | Admitting: Podiatry

## 2018-02-27 DIAGNOSIS — S90122A Contusion of left lesser toe(s) without damage to nail, initial encounter: Secondary | ICD-10-CM | POA: Diagnosis not present

## 2018-02-27 DIAGNOSIS — T1490XA Injury, unspecified, initial encounter: Secondary | ICD-10-CM | POA: Diagnosis not present

## 2018-02-27 NOTE — Progress Notes (Addendum)
His patient presents the office stating she has injured her second toe on her left foot.  She says she  has injured it twice in the last week.  Patient had this toe surgically corrected by Dr. Milinda Pointer and there is a screw present in the toe.  She presents the office today stating that it is swollen and painful since she injured her toe. She does admit she feels this toe has not been right since the surgery.  She desires to have the toe evaluated since she is concerned about the surgery.   General Appearance  Alert, conversant and in no acute stress.  Vascular  Dorsalis pedis and posterior tibial  pulses are palpable  bilaterally.  Capillary return is within normal limits  bilaterally. Temperature is within normal limits  bilaterally.  Neurologic  Senn-Weinstein monofilament wire test within normal limits  bilaterally. Muscle power within normal limits bilaterally.  Nails normal nails noted with no evidence of bacterial or fungal infection.  Orthopedic  No limitations of motion of motion feet .  No crepitus or effusions noted.  No bony pathology or digital deformities noted.  Her 2,3 toes left foot are rectus. Palpable pain noted at the distal aspect of second toe.  Skin  normotropic skin with no porokeratosis noted bilaterally.  No signs of infections or ulcers noted.  Healing skin lesion at distal aspect nail plate second toe left foot.  Toe contusion second toe left foot.  The head of the screw appears to be backing out on x-ray.  Her second toe was buddy taped to her third toe.  She was told to wear surgical shoe when necessary.  She was also told to take Advil or acetaminophen as needed for paiRTC prn.   Gardiner Barefoot DPM

## 2018-03-29 ENCOUNTER — Encounter: Payer: Self-pay | Admitting: Podiatry

## 2018-03-29 ENCOUNTER — Ambulatory Visit: Payer: Medicare Other | Admitting: Podiatry

## 2018-03-29 DIAGNOSIS — T847XXA Infection and inflammatory reaction due to other internal orthopedic prosthetic devices, implants and grafts, initial encounter: Secondary | ICD-10-CM | POA: Diagnosis not present

## 2018-03-29 NOTE — Patient Instructions (Signed)
Pre-Operative Instructions  Congratulations, you have decided to take an important step towards improving your quality of life.  You can be assured that the doctors and staff at [facility] will be with you every step of the way.  Here are some important things you should know:  1. Plan to be at the surgery center/hospital at least 1 (one) hour prior to your scheduled time, unless otherwise directed by the surgical center/hospital staff.  You must have a responsible adult accompany you, remain during the surgery and drive you home.  Make sure you have directions to the surgical center/hospital to ensure you arrive on time. 2. If you are having surgery at [facility] or [facility], you will need a copy of your medical history and physical form from your family physician within one month prior to the date of surgery. We will give you a form for your primary physician to complete.  3. We make every effort to accommodate the date you request for surgery.  However, there are times where surgery dates or times have to be moved.  We will contact you as soon as possible if a change in schedule is required.   4. No aspirin/ibuprofen for one week before surgery.  If you are on aspirin, any non-steroidal anti-inflammatory medications (Mobic, Aleve, Ibuprofen) should not be taken seven (7) days prior to your surgery.  You make take Tylenol for pain prior to surgery.  5. Medications - If you are taking daily heart and blood pressure medications, seizure, reflux, allergy, asthma, anxiety, pain or diabetes medications, make sure you notify the surgery center/hospital before the day of surgery so they can tell you which medications you should take or avoid the day of surgery. 6. No food or drink after midnight the night before surgery unless directed otherwise by surgical center/hospital staff. 7. No alcoholic beverages 24-hours prior to surgery.  No smoking 24-hours prior or 24-hours after  surgery. 8. Wear loose pants or shorts. They should be loose enough to fit over bandages, boots, and casts. 9. Don't wear slip-on shoes. Sneakers are preferred. 10. Bring your boot with you to the surgery center/hospital.  Also bring crutches or a walker if your physician has prescribed it for you.  If you do not have this equipment, it will be provided for you after surgery. 11. If you have not been contacted by the surgery center/hospital by the day before your surgery, call to confirm the date and time of your surgery. 12. Leave-time from work may vary depending on the type of surgery you have.  Appropriate arrangements should be made prior to surgery with your employer. 13. Prescriptions will be provided immediately following surgery by your doctor.  Fill these as soon as possible after surgery and take the medication as directed. Pain medications will not be refilled on weekends and must be approved by the doctor. 14. Remove nail polish on the operative foot and avoid getting pedicures prior to surgery. 15. Wash the night before surgery.  The night before surgery wash the foot and leg well with water and the antibacterial soap provided. Be sure to pay special attention to beneath the toenails and in between the toes.  Wash for at least three (3) minutes. Rinse thoroughly with water and dry well with a towel.  Perform this wash unless told not to do so by your physician.  Enclosed: 1 Ice pack (please put in freezer the night before surgery)   1 Hibiclens skin cleaner     Pre-op instructions  If you have any questions regarding the instructions, please do not hesitate to call our office.  [place]: [address], [place], [place] [address] -- [phone]  [place]: [address] , [place] [address] -- [phone]  [place]: [address]  [place], [place] [address] -- [phone]  [place]: [address], [address], [place], [place] [address] -- [phone]  Website:  [email or web address] 

## 2018-03-29 NOTE — Progress Notes (Signed)
She presents today for a discussion regarding pain to the second digit of the left foot.  She is recently seen by Dr. Prudence Davidson who referred her to [place].  She states the toe still burning and has this small wound on the end of it as she refers to the second digit of the left foot.  She is concerned for infection she denies fever chills nausea vomiting.  Objective: Vital signs are stable alert and oriented x3.  Pulses are palpable.  Neurologic sensorium is intact deep tendon reflexes are intact foot is rectus distal aspect of the second toe does demonstrate what appears to be a superficial wound that does not expose the head of the screw as of yet.  But I do think that ultimately this will ulcerate.  There is no erythema cellulitis drainage or odor.  I reviewed previous radiographs which do demonstrate the screw appears to be backing out in the head of the screw is approaching the skin.  Assessment: Painful internal fixation second and third digits of the left foot.  Plan: Discussed etiology pathology conservative versus surgical therapies.  At this point we consented her for removal of internal fixation second third digits of the left foot.  She signed all 3 pages a consent form.  We did discuss possible postop complications which may include but are not limited to postop pain bleeding swelling infection recurrence need for further surgery overcorrection under correction loss of digit loss of limb loss of life.  She signed all 3 page of the consent form she will bring her Darco shoe with her on the day of surgery.  We dispensed paperwork regarding the surgery center and anesthesia.  I wanted to do her this Friday however she is unable to have that done Friday so were going to request a 630 start time of the surgery center on April 12.

## 2018-03-30 ENCOUNTER — Ambulatory Visit: Payer: Self-pay | Admitting: *Deleted

## 2018-03-30 ENCOUNTER — Encounter: Payer: Self-pay | Admitting: Primary Care

## 2018-03-30 ENCOUNTER — Ambulatory Visit: Payer: Medicare Other | Admitting: Primary Care

## 2018-03-30 VITALS — BP 114/66 | HR 68 | Temp 98.1°F | Ht 64.5 in | Wt 162.8 lb

## 2018-03-30 DIAGNOSIS — R21 Rash and other nonspecific skin eruption: Secondary | ICD-10-CM | POA: Diagnosis not present

## 2018-03-30 DIAGNOSIS — L299 Pruritus, unspecified: Secondary | ICD-10-CM | POA: Diagnosis not present

## 2018-03-30 MED ORDER — METHYLPREDNISOLONE ACETATE 80 MG/ML IJ SUSP
80.0000 mg | Freq: Once | INTRAMUSCULAR | Status: AC
  Administered 2018-03-30: 80 mg via INTRAMUSCULAR

## 2018-03-30 NOTE — Addendum Note (Signed)
Addended by: Jacqualin Combes on: 03/30/2018 12:43 PM   Modules accepted: Orders

## 2018-03-30 NOTE — Telephone Encounter (Signed)
Noted, will evaluate. 

## 2018-03-30 NOTE — Telephone Encounter (Signed)
Pt has appt with Gentry Fitz NP 03/30/18 at 12noon.

## 2018-03-30 NOTE — Telephone Encounter (Signed)
Pt reports "blotchy" rash. Onset last night. Rash is on shoulders,torso,under breasts, upper thighs. Spots are several inches in diameter, non-raised, no open areas. Reports severe itching; took OTC antihistamine and applied "some type of anti-itch cream" with minimal relief. Denies any facial swelling, tongue or throat swelling, afebrile. No new medications. Pt states she did have a tomato based sauce last night which she usually never eats. Same day appt made with K.Clark at 1200. 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 Care advise given per protocol; instructed to go to ED if swelling occurs, difficulty swallowing or with speech.  Reason for Disposition . SEVERE itching (i.e., interferes with sleep, normal activities or school)  Answer Assessment - Initial Assessment Questions 1. APPEARANCE of RASH: "Describe the rash." (e.g., spots, blisters, raised areas, skin peeling, scaly)     Red, blotchy 2. SIZE: "How big are the spots?" (e.g., tip of pen, eraser, coin; inches, centimeters)     Several inches in diameter. 3. LOCATION: "Where is the rash located?"     Arms,chest, ,back, upper thighs 4. COLOR: "What color is the rash?" (Note: It is difficult to assess rash color in people with darker-colored skin. When this situation occurs, simply ask the caller to describe what they see.)     Bright red 5. ONSET: "When did the rash begin?"     During night 6. FEVER: "Do you have a fever?" If so, ask: "What is your temperature, how was it measured, and when did it start?"     no 7. ITCHING: "Does the rash itch?" If so, ask: "How bad is the itch?" (Scale 1-10; or mild, moderate, severe)     Severe 8. CAUSE: "What do you think is causing the rash?"     Unsure, "Possibly something I ate with tomato sauce." 9. MEDICATION FACTORS: "Have you started any new medications within the last 2 weeks?" (e.g., antibiotics)      no 10. OTHER SYMPTOMS: "Do you have any other symptoms?" (e.g., dizziness, headache,  sore throat, joint pain)      no  Protocols used: RASH OR REDNESS - [facility]

## 2018-03-30 NOTE — Progress Notes (Signed)
Subjective:    Patient ID: NATAJAH DERDERIAN, female    DOB: 1951-09-29, 67 y.o.   MRN: [id number]  HPI  Ms. Cousar is a 67 year old female who presents today with a chief complaint of rash.   She woke up at 3 am this morning and started itching to the bilateral lower extremities, posterior and anterior trunk. She's taken a dose of Claritin and applied a small amount of a steroid ointment from her dermatologist with some improvement in itching.   She is active in her yard often, yesterday she was picking weeds. Her husband spread some sort of weed killer/? Chemicals in her yard several days ago, not sure what it was. She did eat mozzarella sticks yesterday afternoon at 2 pm which isn't something she typically eats.   She denies changes in diet, changes in detergents/soaps/deoderant, she has not worn unlaundered clothing. She has noticed some scratchy throat. Denies wheezing, cough, shortness of breath.   Review of Systems  Constitutional: Negative for fever.  HENT: Negative for trouble swallowing.   Respiratory: Negative for cough, shortness of breath and wheezing.   Skin: Positive for color change.       Pruritis        Past Medical History:  Diagnosis Date  . Hyperlipidemia   . Hypertension      Social History   Socioeconomic History  . Marital status: Married    Spouse name: Not on file  . Number of children: Not on file  . Years of education: Not on file  . Highest education level: Not on file  Occupational History  . Occupation: TEACHER    Comment: Kindergarten  Social Needs  . Financial resource strain: Not on file  . Food insecurity:    Worry: Not on file    Inability: Not on file  . Transportation needs:    Medical: Not on file    Non-medical: Not on file  Tobacco Use  . Smoking status: Never Smoker  . Smokeless tobacco: Never Used  Substance and Sexual Activity  . Alcohol use: No  . Drug use: No  . Sexual activity: Not on file  Lifestyle  . Physical  activity:    Days per week: Not on file    Minutes per session: Not on file  . Stress: Not on file  Relationships  . Social connections:    Talks on phone: Not on file    Gets together: Not on file    Attends religious service: Not on file    Active member of club or organization: Not on file    Attends meetings of clubs or organizations: Not on file    Relationship status: Not on file  . Intimate partner violence:    Fear of current or ex partner: Not on file    Emotionally abused: Not on file    Physically abused: Not on file    Forced sexual activity: Not on file  Other Topics Concern  . Not on file  Social History Narrative  . Not on file    Past Surgical History:  Procedure Laterality Date  . APPENDECTOMY    . [place]  . TRIGGER FINGER RELEASE  2006  . WRIST FRACTURE SURGERY     broken left wrist repair with plate    Family History  Problem Relation Age of Onset  . Diabetes Mother   . Heart disease Mother        cad and chf  .  Heart disease Father        heart attack (massive)  . Cancer Maternal Grandmother        COLON  . Stroke Maternal Grandfather   . Diabetes Brother   . Heart disease Brother 42       hert attack  . Heart disease Brother 61       CABG   . Stroke Sister   . Heart disease Sister        VALVE REPLACEMENT  . Breast cancer Cousin 50    Allergies  Allergen Reactions  . Amoxicillin     REACTION: rash  . Sulfa Antibiotics Other (See Comments)  . Sulfonamide Derivatives     REACTION: Blisters on leg    Current Outpatient Medications on File Prior to Visit  Medication Sig Dispense Refill  . b complex vitamins tablet Take 1 tablet by mouth daily.      . benazepril-hydrochlorthiazide (LOTENSIN HCT) 20-12.5 MG tablet Take 1 tablet by mouth daily.    . Blood Glucose Monitoring Suppl (ONE TOUCH ULTRA MINI) W/DEVICE KIT by Does not apply route. Test daily or as directed     . Nutritional Supplements (JUICE PLUS FIBRE  PO) Take by mouth daily.    . ONE TOUCH ULTRA TEST test strip USE TO CHECK BLOOD SUGAR DAILY. DX: E11.9 100 each 3  . rosuvastatin (CRESTOR) 5 MG tablet TAKE 1 TABLET BY MOUTH EVERY DAY 90 tablet 0  . venlafaxine XR (EFFEXOR-XR) 75 MG 24 hr capsule TAKE 1 CAPSULE BY MOUTH EVERY DAY 90 capsule 0  . triamcinolone ointment (KENALOG) 0.1 % APPLY ON THE SKIN TWICE A DAY AS NEEDED AS NEEDED NOT TO FACE, GROIN, UNDERARMS  2  . valACYclovir (VALTREX) 1000 MG tablet Take 1 tablet (1,000 mg total) by mouth 3 (three) times daily. (Patient not taking: Reported on 03/30/2018) 21 tablet 0   No current facility-administered medications on file prior to visit.     BP 114/66   Pulse 68   Temp 98.1 F (36.7 C) (Oral)   Ht 5' 4.5" (1.638 m)   Wt 162 lb 12 oz (73.8 kg)   SpO2 98%   BMI 27.50 kg/m    Objective:   Physical Exam  Constitutional: She appears well-nourished.  HENT:  Right Ear: Tympanic membrane and ear canal normal.  Left Ear: Tympanic membrane and ear canal normal.  Nose: Right sinus exhibits no maxillary sinus tenderness and no frontal sinus tenderness. Left sinus exhibits no maxillary sinus tenderness and no frontal sinus tenderness.  Mouth/Throat: Oropharynx is clear and moist.  Eyes: Conjunctivae are normal.  Neck: Neck supple.  Cardiovascular: Normal rate and regular rhythm.  Pulmonary/Chest: Effort normal and breath sounds normal. She has no wheezes. She has no rales.  Lymphadenopathy:    She has no cervical adenopathy.  Skin: Skin is warm and dry.  Wide spread (mild) erythema to most of posterior trunk, part of anterior trunk, part of bilateral upper extremities. No patches or defined rash.          Assessment & Plan:  Pruritus:  Also with diffuse erythema to body, likely from scratching. No defined rash. Do suspect allergy involvement to either lawn weed killer vs other environmental trigger. She does not appear sickly and is not in respiratory distress. Treat with  Claritin daily, Pepcid 1-2 times daily.  IM Depo Medrol 80 mg provided today. She will update if no significant improvement.  Consider allergy referral if symptoms persist.   Belenda Cruise  Gentry Fitz, NP

## 2018-03-30 NOTE — Patient Instructions (Signed)
Start taking Claritin once daily.  Start taking famotidine 20 mg tablets twice daily.  Please call me if no improvement in 24 hours.  It was a pleasure meeting you!

## 2018-04-05 ENCOUNTER — Other Ambulatory Visit: Payer: Self-pay | Admitting: Podiatry

## 2018-04-05 MED ORDER — CLINDAMYCIN HCL 150 MG PO CAPS: 150.0000 mg | ORAL_CAPSULE | Freq: Three times a day (TID) | ORAL | 0 refills | Status: DC

## 2018-04-05 MED ORDER — HYDROCODONE-ACETAMINOPHEN 10-325 MG PO TABS: 1.0000 | ORAL_TABLET | Freq: Four times a day (QID) | ORAL | 0 refills | Status: AC | PRN

## 2018-04-07 ENCOUNTER — Encounter: Payer: Self-pay | Admitting: Podiatry

## 2018-04-07 DIAGNOSIS — Z4889 Encounter for other specified surgical aftercare: Secondary | ICD-10-CM | POA: Diagnosis not present

## 2018-04-12 ENCOUNTER — Ambulatory Visit (INDEPENDENT_AMBULATORY_CARE_PROVIDER_SITE_OTHER): Payer: Medicare Other | Admitting: Podiatry

## 2018-04-12 ENCOUNTER — Ambulatory Visit (INDEPENDENT_AMBULATORY_CARE_PROVIDER_SITE_OTHER): Payer: Medicare Other

## 2018-04-12 ENCOUNTER — Encounter: Payer: Self-pay | Admitting: Podiatry

## 2018-04-12 DIAGNOSIS — Z9889 Other specified postprocedural states: Secondary | ICD-10-CM

## 2018-04-12 DIAGNOSIS — T847XXD Infection and inflammatory reaction due to other internal orthopedic prosthetic devices, implants and grafts, subsequent encounter: Secondary | ICD-10-CM

## 2018-04-12 NOTE — Progress Notes (Signed)
She presents today for postop visit status post removal screws second and third digits of the left foot.  Denies fever chills nausea vomitus explains that they are doing great.  Objective: No erythema edema cellulitis drainage or odor radiograph demonstrate complete screw removal I see no signs of remaining purulence or bone breakdown.  Assessment: Well-healing surgical toes #2 #3 of the left foot.  Plan: She will follow-up with Dr. Amalia Hailey in 1 week for suture removal and that she is released.

## 2018-04-18 ENCOUNTER — Ambulatory Visit (INDEPENDENT_AMBULATORY_CARE_PROVIDER_SITE_OTHER): Payer: Medicare Other | Admitting: Podiatry

## 2018-04-18 ENCOUNTER — Encounter: Payer: Self-pay | Admitting: Podiatry

## 2018-04-18 DIAGNOSIS — Z9889 Other specified postprocedural states: Secondary | ICD-10-CM

## 2018-04-20 NOTE — Progress Notes (Signed)
   Subjective:  Patient presents today status post hammertoe repair of the 2nd and 3rd digits of the right foot. DOS: 04/07/18. She states she is doing well. She reports taking Clindamycin as directed. She denies any new complaints. Patient is here for further evaluation and treatment.    Past Medical History:  Diagnosis Date  . Hyperlipidemia   . Hypertension       Objective/Physical Exam Neurovascular status intact.  Skin incisions appear to be well coapted with sutures and staples intact. No sign of infectious process noted. No dehiscence. No active bleeding noted. Moderate edema noted to the surgical extremity.  Radiographic Exam:  Orthopedic hardware and osteotomies sites appear to be stable with routine healing.  Assessment: 1. s/p hammertoe repair 2nd and 3rd digits right foot. DOS: 04/07/18   Plan of Care:  1. Patient was evaluated.  2. Sutures removed.  3. Recommended good shoe gear.  4. Transition back to full activity. 5. Return to clinic in 4 weeks.    Edrick Kins, DPM [facility]  Dr. Edrick Kins, [address]                                        [place], [place] [address]                Office [phone]  Fax [phone]

## 2018-04-26 ENCOUNTER — Encounter: Payer: Medicare Other | Admitting: Podiatry

## 2018-04-27 ENCOUNTER — Other Ambulatory Visit: Payer: Self-pay | Admitting: Podiatry

## 2018-04-27 DIAGNOSIS — S90122A Contusion of left lesser toe(s) without damage to nail, initial encounter: Secondary | ICD-10-CM

## 2018-05-10 LAB — HM DIABETES EYE EXAM

## 2018-05-17 ENCOUNTER — Ambulatory Visit (INDEPENDENT_AMBULATORY_CARE_PROVIDER_SITE_OTHER): Payer: Medicare Other | Admitting: Podiatry

## 2018-05-17 ENCOUNTER — Ambulatory Visit (INDEPENDENT_AMBULATORY_CARE_PROVIDER_SITE_OTHER): Payer: Medicare Other

## 2018-05-17 ENCOUNTER — Encounter: Payer: Self-pay | Admitting: Podiatry

## 2018-05-17 ENCOUNTER — Other Ambulatory Visit: Payer: Self-pay | Admitting: Podiatry

## 2018-05-17 DIAGNOSIS — M7750 Other enthesopathy of unspecified foot: Secondary | ICD-10-CM

## 2018-05-17 DIAGNOSIS — M2042 Other hammer toe(s) (acquired), left foot: Secondary | ICD-10-CM

## 2018-05-17 DIAGNOSIS — M7752 Other enthesopathy of left foot: Secondary | ICD-10-CM

## 2018-05-17 DIAGNOSIS — Z9889 Other specified postprocedural states: Secondary | ICD-10-CM

## 2018-05-17 NOTE — Progress Notes (Signed)
She presents today date of surgery 04/07/2018 for removal of internal fixation second third digits of the left foot.  States that is doing perfect and I left my toes however I have pain on here she points to the fourth and fifth metatarsal cuboid articulation.  Denies any trauma.  Denies any medial heel pain.  Objective: Vital signs are stable alert and oriented x3.  Pulses are palpable.  She has pain on dorsiflexion of the fourth and fifth metatarsals distally jamming the tarsometatarsal joint proximally.  He has no pain on palpation medial calcaneal tubercle.  Radiographs taken today do not demonstrate any type of acute abnormalities in this area.  No osteoarthritic changes here either.  Assessment: Capsulitis fourth fifth tarsometatarsal joint.  Well-healing surgical foot otherwise.  Plan: We discussed appropriate shoe gear and the use of non-steroidal anti-inflammatories today she will continue to do this for the next couple of weeks and wear tennis shoes.  Follow-up with her if this does not resolve for an injection.

## 2018-05-26 ENCOUNTER — Other Ambulatory Visit: Payer: Self-pay | Admitting: Family Medicine

## 2018-05-31 ENCOUNTER — Other Ambulatory Visit: Payer: Self-pay | Admitting: Family Medicine

## 2018-06-07 ENCOUNTER — Telehealth: Payer: Self-pay | Admitting: Family Medicine

## 2018-06-07 DIAGNOSIS — M8589 Other specified disorders of bone density and structure, multiple sites: Secondary | ICD-10-CM

## 2018-06-07 DIAGNOSIS — E119 Type 2 diabetes mellitus without complications: Secondary | ICD-10-CM

## 2018-06-07 NOTE — Telephone Encounter (Signed)
-----   Message from Eustace Pen, LPN sent at [phone]/85/9292  1:32 PM EDT ----- Regarding: Labs 6/13 Lab orders needed. Thank you.  Insurance:  [facility]

## 2018-06-08 ENCOUNTER — Ambulatory Visit (INDEPENDENT_AMBULATORY_CARE_PROVIDER_SITE_OTHER): Payer: Medicare Other

## 2018-06-08 VITALS — BP 118/68 | HR 61 | Temp 97.9°F | Ht 64.5 in | Wt 156.5 lb

## 2018-06-08 DIAGNOSIS — M8589 Other specified disorders of bone density and structure, multiple sites: Secondary | ICD-10-CM

## 2018-06-08 DIAGNOSIS — E119 Type 2 diabetes mellitus without complications: Secondary | ICD-10-CM

## 2018-06-08 DIAGNOSIS — Z0000 Encounter for general adult medical examination without abnormal findings: Secondary | ICD-10-CM

## 2018-06-08 LAB — COMPREHENSIVE METABOLIC PANEL
ALBUMIN: 4.1 g/dL (ref 3.5–5.2)
ALK PHOS: 69 U/L (ref 39–117)
ALT: 15 U/L (ref 0–35)
AST: 15 U/L (ref 0–37)
BILIRUBIN TOTAL: 0.5 mg/dL (ref 0.2–1.2)
BUN: 19 mg/dL (ref 6–23)
CO2: 31 mEq/L (ref 19–32)
CREATININE: 0.71 mg/dL (ref 0.40–1.20)
Calcium: 9.5 mg/dL (ref 8.4–10.5)
Chloride: 103 mEq/L (ref 96–112)
GFR: 86.8 mL/min (ref >60.00)
GLUCOSE: 105 mg/dL — AB (ref 70–99)
Potassium: 4 mEq/L (ref 3.5–5.1)
SODIUM: 141 meq/L (ref 135–145)
TOTAL PROTEIN: 6.7 g/dL (ref 6.0–8.3)

## 2018-06-08 LAB — LIPID PANEL
Cholesterol: 171 mg/dL (ref 0–200)
HDL: 67.3 mg/dL (ref >39.00)
LDL CALC: 89 mg/dL (ref 0–99)
NonHDL: 103.44
Total CHOL/HDL Ratio: 3
Triglycerides: 72 mg/dL (ref 0.0–149.0)
VLDL: 14.4 mg/dL (ref 0.0–40.0)

## 2018-06-08 LAB — HEMOGLOBIN A1C: HEMOGLOBIN A1C: 6.4 % (ref 4.6–6.5)

## 2018-06-08 LAB — VITAMIN D 25 HYDROXY (VIT D DEFICIENCY, FRACTURES): VITD: 24.16 ng/mL — AB (ref 30.00–100.00)

## 2018-06-08 NOTE — Patient Instructions (Addendum)
Meghan Kerr , Thank you for taking time to come for your Medicare Wellness Visit. I appreciate your ongoing commitment to your health goals. Please review the following plan we discussed and let me know if I can assist you in the future.   These are the goals we discussed: Goals    . DIET - INCREASE WATER INTAKE     Starting 06/08/2018, I will attempt to drink at least 6-8 glasses of water daily.        This is a list of the screening recommended for you and due dates:  Health Maintenance  Topic Date Due  . Complete foot exam   06/16/2018*  . Tetanus Vaccine  06/09/2019*  . Flu Shot  07/27/2018  . Hemoglobin A1C  12/08/2018  . Eye exam for diabetics  05/11/2019  . Mammogram  02/08/2020  . Cologuard (Stool DNA test)  04/13/2020  . DEXA scan (bone density measurement)  Completed  .  Hepatitis C: One time screening is recommended by Center for Disease Control  (CDC) for  adults born from [birth year] through 1967.   Completed  . Pneumonia vaccines  Completed  *Topic was postponed. The date shown is not the original due date.    Preventive Care for Adults  A healthy lifestyle and preventive care can promote health and wellness. Preventive health guidelines for adults include the following key practices.  . A routine yearly physical is a good way to check with your health care provider about your health and preventive screening. It is a chance to share any concerns and updates on your health and to receive a thorough exam.  . Visit your dentist for a routine exam and preventive care every 6 months. Brush your teeth twice a day and floss once a day. Good oral hygiene prevents tooth decay and gum disease.  . The frequency of eye exams is based on your age, health, family medical history, use  of contact lenses, and other factors. Follow your health care provider's recommendations for frequency of eye exams.  . Eat a healthy diet. Foods like vegetables, fruits, whole grains, low-fat dairy  products, and lean protein foods contain the nutrients you need without too many calories. Decrease your intake of foods high in solid fats, added sugars, and salt. Eat the right amount of calories for you. Get information about a proper diet from your health care provider, if necessary.  . Regular physical exercise is one of the most important things you can do for your health. Most adults should get at least 150 minutes of moderate-intensity exercise (any activity that increases your heart rate and causes you to sweat) each week. In addition, most adults need muscle-strengthening exercises on 2 or more days a week.  Silver Sneakers may be a benefit available to you. To determine eligibility, you may visit the website: www.silversneakers.com or contact program at 513-215-6914 Mon-Fri between 8AM-8PM.   . Maintain a healthy weight. The body mass index (BMI) is a screening tool to identify possible weight problems. It provides an estimate of body fat based on height and weight. Your health care provider can find your BMI and can help you achieve or maintain a healthy weight.   For adults 20 years and older: ? A BMI below 18.5 is considered underweight. ? A BMI of 18.5 to 24.9 is normal. ? A BMI of 25 to 29.9 is considered overweight. ? A BMI of 30 and above is considered obese.   . Maintain normal blood lipids  and cholesterol levels by exercising and minimizing your intake of saturated fat. Eat a balanced diet with plenty of fruit and vegetables. Blood tests for lipids and cholesterol should begin at age 56 and be repeated every 5 years. If your lipid or cholesterol levels are high, you are over 50, or you are at high risk for heart disease, you may need your cholesterol levels checked more frequently. Ongoing high lipid and cholesterol levels should be treated with medicines if diet and exercise are not working.  . If you smoke, find out from your health care provider how to quit. If you do not  use tobacco, please do not start.  . If you choose to drink alcohol, please do not consume more than 2 drinks per day. One drink is considered to be 12 ounces (355 mL) of beer, 5 ounces (148 mL) of wine, or 1.5 ounces (44 mL) of liquor.  . If you are 54-60 years old, ask your health care provider if you should take aspirin to prevent strokes.  . Use sunscreen. Apply sunscreen liberally and repeatedly throughout the day. You should seek shade when your shadow is shorter than you. Protect yourself by wearing long sleeves, pants, a wide-brimmed hat, and sunglasses year round, whenever you are outdoors.  . Once a month, do a whole body skin exam, using a mirror to look at the skin on your back. Tell your health care provider of new moles, moles that have irregular borders, moles that are larger than a pencil eraser, or moles that have changed in shape or color.

## 2018-06-08 NOTE — Progress Notes (Signed)
I reviewed health advisor's note, was available for consultation, and agree with documentation and plan.  

## 2018-06-08 NOTE — Progress Notes (Signed)
PCP notes:   Health maintenance:  Foot exam - PCP please address at next appt Tetanus vaccine - postponed/insurance A1C - completed Eye exam - per pt, vision exam in May 2019  Abnormal screenings:   None  Patient concerns:   None  Nurse concerns:  None  Next PCP appt:   06/16/18 @ 1400

## 2018-06-08 NOTE — Progress Notes (Signed)
Subjective:   Meghan Kerr is a 67 y.o. female who presents for Medicare Annual (Subsequent) preventive examination.  Review of Systems:  N/A Cardiac Risk Factors include: advanced age (>67mn, >>25women);diabetes mellitus;dyslipidemia;hypertension     Objective:     Vitals: BP 118/68 (BP Location: Right Arm, Patient Position: Sitting, Cuff Size: Normal)   Pulse 61   Temp 97.9 F (36.6 C) (Oral)   Ht 5' 4.5" (1.638 m) Comment: no shoes  Wt 156 lb 8 oz (71 kg)   SpO2 96%   BMI 26.45 kg/m   Body mass index is 26.45 kg/m.  Advanced Directives 06/08/2018 12/03/2016  Does Patient Have a Medical Advance Directive? No Yes  Type of Advance Directive - [place]  Copy of [place] Chart? - No - copy requested  Would patient like information on creating a medical advance directive? Yes (MAU/Ambulatory/Procedural Areas - Information given) -    Tobacco Social History   Tobacco Use  Smoking Status Never Smoker  Smokeless Tobacco Never Used     Counseling given: No   Clinical Intake:  Pre-visit preparation completed: Yes  Pain : No/denies pain Pain Score: 0-No pain     Nutritional Status: BMI 25 -29 Overweight Nutritional Risks: None Diabetes: No  How often do you need to have someone help you when you read instructions, pamphlets, or other written materials from your doctor or pharmacy?: 1 - Never What is the last grade level you completed in school?: Masters degree  Interpreter Needed?: No  Comments: pt lives with spouse Information entered by :: LPinson, LPN  Past Medical History:  Diagnosis Date  . Hyperlipidemia   . Hypertension    Past Surgical History:  Procedure Laterality Date  . [place] Left 08/19/2017   hardware removed April 2019  . TRIGGER FINGER RELEASE  2006  . WRIST FRACTURE SURGERY     broken left wrist repair with  plate   Family History  Problem Relation Age of Onset  . Diabetes Mother   . Heart disease Mother        cad and chf  . Heart disease Father        heart attack (massive)  . Cancer Maternal Grandmother        COLON  . Stroke Maternal Grandfather   . Diabetes Brother   . Heart disease Brother 422      hert attack  . Heart disease Brother 745      CABG   . Stroke Sister   . Heart disease Sister        VALVE REPLACEMENT  . Breast cancer Cousin 560  Social History   Socioeconomic History  . Marital status: Married    Spouse name: Not on file  . Number of children: Not on file  . Years of education: Not on file  . Highest education level: Not on file  Occupational History  . Occupation: TEACHER    Comment: Kindergarten  Social Needs  . Financial resource strain: Not on file  . Food insecurity:    Worry: Not on file    Inability: Not on file  . Transportation needs:    Medical: Not on file    Non-medical: Not on file  Tobacco Use  . Smoking status: Never Smoker  . Smokeless tobacco: Never Used  Substance and Sexual Activity  . Alcohol  use: No  . Drug use: No  . Sexual activity: Not on file  Lifestyle  . Physical activity:    Days per week: Not on file    Minutes per session: Not on file  . Stress: Not on file  Relationships  . Social connections:    Talks on phone: Not on file    Gets together: Not on file    Attends religious service: Not on file    Active member of club or organization: Not on file    Attends meetings of clubs or organizations: Not on file    Relationship status: Not on file  Other Topics Concern  . Not on file  Social History Narrative  . Not on file    Outpatient Encounter Medications as of 06/08/2018  Medication Sig  . b complex vitamins tablet Take 1 tablet by mouth daily.    . benazepril-hydrochlorthiazide (LOTENSIN HCT) 20-12.5 MG tablet Take 0.5 tablets by mouth daily.   . Blood Glucose Monitoring Suppl (ONE TOUCH ULTRA MINI)  W/DEVICE KIT by Does not apply route. Test daily or as directed   . Nutritional Supplements (JUICE PLUS FIBRE PO) Take by mouth daily.  . ONE TOUCH ULTRA TEST test strip USE TO CHECK BLOOD SUGAR DAILY. DX: E11.9  . rosuvastatin (CRESTOR) 5 MG tablet TAKE 1 TABLET BY MOUTH EVERY DAY (Patient taking differently: TAKE 1 TABLET BY MOUTH EVERY OTHER DAY)  . venlafaxine XR (EFFEXOR-XR) 75 MG 24 hr capsule TAKE 1 CAPSULE BY MOUTH EVERY DAY  . [DISCONTINUED] clindamycin (CLEOCIN) 150 MG capsule Take 1 capsule (150 mg total) by mouth 3 (three) times daily.  . [DISCONTINUED] triamcinolone ointment (KENALOG) 0.1 % APPLY ON THE SKIN TWICE A DAY AS NEEDED AS NEEDED NOT TO FACE, GROIN, UNDERARMS  . [DISCONTINUED] valACYclovir (VALTREX) 1000 MG tablet Take 1 tablet (1,000 mg total) by mouth 3 (three) times daily.   No facility-administered encounter medications on file as of 06/08/2018.     Activities of Daily Living In your present state of health, do you have any difficulty performing the following activities: 06/08/2018  Hearing? N  Vision? N  Difficulty concentrating or making decisions? N  Walking or climbing stairs? N  Dressing or bathing? N  Doing errands, shopping? N  Preparing Food and eating ? N  Using the Toilet? N  In the past six months, have you accidently leaked urine? N  Do you have problems with loss of bowel control? N  Managing your Medications? N  Managing your Finances? N  Housekeeping or managing your Housekeeping? N  Some recent data might be hidden    Patient Care Team: Jinny Sanders, MD as PCP - General Edison Pace Josie Saunders, MD as Consulting Physician (Ophthalmology)    Assessment:   This is a routine wellness examination for Meghan Kerr.   Hearing Screening   '125Hz'  '250Hz'  '500Hz'  '1000Hz'  '2000Hz'  '3000Hz'  '4000Hz'  '6000Hz'  '8000Hz'   Right ear:   40 40 40  40    Left ear:   40 40 40  40    Vision Screening Comments: May 2019 @ [place]    Exercise  Activities and Dietary recommendations Current Exercise Habits: The patient does not participate in regular exercise at present, Exercise limited by: None identified  Goals    . DIET - INCREASE WATER INTAKE     Starting 06/08/2018, I will attempt to drink at least 6-8 glasses of water daily.        Fall Risk  Fall Risk  06/08/2018 12/03/2016 12/01/2015 09/13/2014  Falls in the past year? No Yes No Yes  Number falls in past yr: - 1 - 2 or more  Injury with Fall? - No - -  Risk for fall due to : - - - History of fall(s)   Depression Screen PHQ 2/9 Scores 06/08/2018 06/21/2017 12/03/2016 12/01/2015  PHQ - 2 Score 0 0 0 0  PHQ- 9 Score 0 - - -     Cognitive Function MMSE - Mini Mental State Exam 06/08/2018  Orientation to time 5  Orientation to Place 5  Registration 3  Attention/ Calculation 0  Recall 3  Language- name 2 objects 0  Language- repeat 1  Language- follow 3 step command 3  Language- read & follow direction 0  Write a sentence 0  Copy design 0  Total score 20     PLEASE NOTE: A Mini-Cog screen was completed. Maximum score is 20. A value of 0 denotes this part of Folstein MMSE was not completed or the patient failed this part of the Mini-Cog screening.   Mini-Cog Screening Orientation to Time - Max 5 pts Orientation to Place - Max 5 pts Registration - Max 3 pts Recall - Max 3 pts Language Repeat - Max 1 pts Language Follow 3 Step Command - Max 3 pts     Immunization History  Administered Date(s) Administered  . Influenza Whole 10/24/2008, 10/23/2010  . Influenza,inj,Quad PF,6+ Mos 09/13/2014, 01/15/2015, 12/01/2015, 12/03/2016, 10/26/2017  . Pneumococcal Conjugate-13 09/13/2014  . Pneumococcal Polysaccharide-23 10/24/2008, 01/15/2015  . Td 12/27/2005    Screening Tests Health Maintenance  Topic Date Due  . FOOT EXAM  06/16/2018 (Originally 12/03/2017)  . TETANUS/TDAP  06/09/2019 (Originally 12/28/2015)  . INFLUENZA VACCINE  07/27/2018  . HEMOGLOBIN A1C   12/08/2018  . OPHTHALMOLOGY EXAM  05/11/2019  . MAMMOGRAM  02/08/2020  . Fecal DNA (Cologuard)  04/13/2020  . DEXA SCAN  Completed  . Hepatitis C Screening  Completed  . PNA vac Low Risk Adult  Completed      Plan:     I have personally reviewed, addressed, and noted the following in the patient's chart:  A. Medical and social history B. Use of alcohol, tobacco or illicit drugs  C. Current medications and supplements D. Functional ability and status E.  Nutritional status F.  Physical activity G. Advance directives H. List of other physicians I.  Hospitalizations, surgeries, and ER visits in previous 12 months J.  [place] to include hearing, vision, cognitive, depression L. Referrals and appointments - none  In addition, I have reviewed and discussed with patient certain preventive protocols, quality metrics, and best practice recommendations. A written personalized care plan for preventive services as well as general preventive health recommendations were provided to patient.  See attached scanned questionnaire for additional information.   Signed,   Lindell Noe, MHA, BS, LPN Health Coach

## 2018-06-09 ENCOUNTER — Telehealth: Payer: Self-pay | Admitting: Family Medicine

## 2018-06-09 NOTE — Telephone Encounter (Signed)
Sure .. Work it in when ever I have a 30 min.. Or hold 2 15 min slits.   Okay to do that for anyone rescheduled given it was my fault.

## 2018-06-09 NOTE — Telephone Encounter (Signed)
-----   Message from Elie Confer sent at 06/09/2018  1:38 PM EDT ----- I called to reschedule pt's physical on 6/21. She states she needs it done by 6/30 and there aren't currently any available slots. Any chance we can fit her in?

## 2018-06-16 ENCOUNTER — Encounter: Payer: Medicare Other | Admitting: Family Medicine

## 2018-06-23 ENCOUNTER — Other Ambulatory Visit: Payer: Self-pay | Admitting: Family Medicine

## 2018-07-12 LAB — HM DIABETES EYE EXAM

## 2018-07-20 ENCOUNTER — Encounter: Payer: Self-pay | Admitting: Family Medicine

## 2018-07-20 ENCOUNTER — Ambulatory Visit (INDEPENDENT_AMBULATORY_CARE_PROVIDER_SITE_OTHER): Payer: Medicare Other | Admitting: Family Medicine

## 2018-07-20 VITALS — BP 130/68 | HR 65 | Temp 98.3°F | Ht 64.5 in | Wt 159.0 lb

## 2018-07-20 DIAGNOSIS — F331 Major depressive disorder, recurrent, moderate: Secondary | ICD-10-CM

## 2018-07-20 DIAGNOSIS — Z0000 Encounter for general adult medical examination without abnormal findings: Secondary | ICD-10-CM | POA: Diagnosis not present

## 2018-07-20 DIAGNOSIS — I10 Essential (primary) hypertension: Secondary | ICD-10-CM | POA: Diagnosis not present

## 2018-07-20 DIAGNOSIS — E2839 Other primary ovarian failure: Secondary | ICD-10-CM

## 2018-07-20 DIAGNOSIS — E782 Mixed hyperlipidemia: Secondary | ICD-10-CM

## 2018-07-20 DIAGNOSIS — E119 Type 2 diabetes mellitus without complications: Secondary | ICD-10-CM

## 2018-07-20 LAB — HM DIABETES FOOT EXAM

## 2018-07-20 MED ORDER — VENLAFAXINE HCL ER 37.5 MG PO CP24: 37.5000 mg | ORAL_CAPSULE | Freq: Every day | ORAL | 1 refills | Status: DC

## 2018-07-20 NOTE — Patient Instructions (Addendum)
Keep working healthy diet and exercise 3-5 times a week.  Continue vit D supplement over next year. Please stop at the front desk to set up referral.

## 2018-07-20 NOTE — Assessment & Plan Note (Signed)
Well controlled. Continue current medication. Encouraged exercise, weight loss, healthy eating habits.  

## 2018-07-20 NOTE — Assessment & Plan Note (Signed)
IMproved control on keto diet. On no med.

## 2018-07-20 NOTE — Assessment & Plan Note (Signed)
Well controlled. Continue current medication.  

## 2018-07-20 NOTE — Assessment & Plan Note (Signed)
Well controlled.. Try to wean off medication as able. Lower dose sent in to pharmacy.

## 2018-07-20 NOTE — Progress Notes (Signed)
Subjective:    Patient ID: Meghan Kerr, female    DOB: 20-Nov-1951, 67 y.o.   MRN: [id number]  HPI The patient presents for  complete physical and review of chronic health problems. He/She also has the following acute concerns today:  The patient saw Candis Musa, LPN for medicare wellness. Note reviewed in detail and important notes copied below. Health maintenance:  Foot exam - PCP please address at next appt Tetanus vaccine - postponed/insurance A1C - completed Eye exam - per pt, vision exam in May 2019  Abnormal screenings:   None  Today 07/20/18   Retinal hemmorahage in right eye.. Stable at last eye exam.  Diabetes:   Good control on no med. Lab Results  Component Value Date   HGBA1C 6.4 06/08/2018  Using medications without difficulties: Hypoglycemic episodes: Hyperglycemic episodes: Feet problems:no ulcers Blood Sugars averaging: not chekcing eye exam within last year: yes  Hypertension: Well controlled on benazapril HCT Using medication without problems or lightheadedness: none Chest pain with exertion:none Edema:none Short of breath:none Average home BPs: not checking Other issues:   Vit D is low.. Taking 1000IU daily in last month.  Elevated Cholesterol:  LDL at goal < 100 on crestor 5 mg  Every other day  Lab Results  Component Value Date   CHOL 171 06/08/2018   HDL 67.30 06/08/2018   LDLCALC 89 06/08/2018   LDLDIRECT 190.1 07/15/2011   TRIG 72.0 06/08/2018   CHOLHDL 3 06/08/2018  No SE to crestor.  Diet: keto diet  exercise: gardening Wt Readings from Last 3 Encounters:  07/20/18 159 lb (72.1 kg)  06/08/18 156 lb 8 oz (71 kg)  03/30/18 162 lb 12 oz (73.8 kg)  Body mass index is 26.87 kg/m.   MDD, recurrent:  Well controlled on  venlafaxine. Depression screen [facility] 2/9 06/08/2018 06/21/2017 12/03/2016  Decreased Interest 0 0 0  Down, Depressed, Hopeless 0 0 0  PHQ - 2 Score 0 0 0  Altered sleeping 0 - -  Tired, decreased energy  0 - -  Change in appetite 0 - -  Feeling bad or failure about yourself  0 - -  Trouble concentrating 0 - -  Moving slowly or fidgety/restless 0 - -  Suicidal thoughts 0 - -  PHQ-9 Score 0 - -  Difficult doing work/chores Not difficult at all - -     Social History /Family History/Past Medical History reviewed in detail and updated in EMR if needed. Blood pressure 130/68, pulse 65, temperature 98.3 F (36.8 C), temperature source Oral, height 5' 4.5" (1.638 m), weight 159 lb (72.1 kg), SpO2 97 %.   Review of Systems  Constitutional: Negative for fatigue and fever.  HENT: Negative for congestion.   Eyes: Negative for pain.  Respiratory: Negative for cough and shortness of breath.   Cardiovascular: Negative for chest pain, palpitations and leg swelling.  Gastrointestinal: Negative for abdominal pain.  Genitourinary: Negative for dysuria and vaginal bleeding.  Musculoskeletal: Negative for back pain.  Neurological: Negative for syncope, light-headedness and headaches.  Psychiatric/Behavioral: Negative for dysphoric mood.       Objective:   Physical Exam  Constitutional: Vital signs are normal. She appears well-developed and well-nourished. She is cooperative.  Non-toxic appearance. She does not appear ill. No distress.  HENT:  Head: Normocephalic.  Right Ear: Hearing, tympanic membrane, external ear and ear canal normal.  Left Ear: Hearing, tympanic membrane, external ear and ear canal normal.  Nose: Nose normal.  Eyes: Pupils are equal,  round, and reactive to light. Conjunctivae, EOM and lids are normal. Lids are everted and swept, no foreign bodies found.  Neck: Trachea normal and normal range of motion. Neck supple. Carotid bruit is not present. No thyroid mass and no thyromegaly present.  Cardiovascular: Normal rate, regular rhythm, S1 normal, S2 normal, normal heart sounds and intact distal pulses. Exam reveals no gallop.  No murmur heard. Pulmonary/Chest: Effort normal and  breath sounds normal. No respiratory distress. She has no wheezes. She has no rhonchi. She has no rales.  Abdominal: Soft. Normal appearance and bowel sounds are normal. She exhibits no distension, no fluid wave, no abdominal bruit and no mass. There is no hepatosplenomegaly. There is no tenderness. There is no rebound, no guarding and no CVA tenderness. No hernia.  Lymphadenopathy:    She has no cervical adenopathy.    She has no axillary adenopathy.  Neurological: She is alert. She has normal strength. No cranial nerve deficit or sensory deficit.  Skin: Skin is warm, dry and intact. No rash noted.  Psychiatric: Her speech is normal and behavior is normal. Judgment normal. Her mood appears not anxious. Cognition and memory are normal. She does not exhibit a depressed mood.     Diabetic foot exam: Normal inspection No skin breakdown No calluses  Normal DP pulses Normal sensation to light touch and monofilament Nails normal      Assessment & Plan:  The patient's preventative maintenance and recommended screening tests for an annual wellness exam were reviewed in full today. Brought up to date unless services declined.  Counselled on the importance of diet, exercise, and its role in overall health and mortality. The patient's FH and SH was reviewed, including their home life, tobacco status, and drug and alcohol status.   Vaccines; Uptodate PNA, declined zostavax, . DVE/pap:pap not indicated, DVE  not indicated  Colon: Date of Last Colonoscopy: 05/28/2007.. In [place] Results: Hyperplastic Polyp, recommended repeat in 5 years. She request stool ifob instead. Had ifob neg 05/2015. Plan cologuard.. nml 03/2017 Mammo:01/2018 nml, repeat every 2 year. DEXA: osteopenia in 04/2011, repeat  Due now. Hep C: completed.

## 2018-08-02 ENCOUNTER — Encounter: Payer: Self-pay | Admitting: Family Medicine

## 2018-09-06 ENCOUNTER — Ambulatory Visit
Admission: RE | Admit: 2018-09-06 | Discharge: 2018-09-06 | Disposition: A | Discharge status: Home / Self Care | Payer: Medicare Other | Source: Ambulatory Visit | Attending: Family Medicine | Admitting: Family Medicine

## 2018-09-06 DIAGNOSIS — E2839 Other primary ovarian failure: Secondary | ICD-10-CM

## 2018-09-29 ENCOUNTER — Other Ambulatory Visit: Payer: Self-pay | Admitting: Family Medicine

## 2018-09-30 ENCOUNTER — Other Ambulatory Visit: Payer: Self-pay | Admitting: Family Medicine

## 2018-10-03 ENCOUNTER — Encounter: Payer: Self-pay | Admitting: Family Medicine

## 2018-10-03 ENCOUNTER — Ambulatory Visit: Payer: Medicare Other | Admitting: Family Medicine

## 2018-10-03 VITALS — BP 120/62 | HR 60 | Temp 98.0°F | Ht 64.5 in | Wt 160.5 lb

## 2018-10-03 DIAGNOSIS — L299 Pruritus, unspecified: Secondary | ICD-10-CM | POA: Diagnosis not present

## 2018-10-03 DIAGNOSIS — F331 Major depressive disorder, recurrent, moderate: Secondary | ICD-10-CM | POA: Diagnosis not present

## 2018-10-03 DIAGNOSIS — Z23 Encounter for immunization: Secondary | ICD-10-CM | POA: Diagnosis not present

## 2018-10-03 DIAGNOSIS — R590 Localized enlarged lymph nodes: Secondary | ICD-10-CM | POA: Diagnosis not present

## 2018-10-03 NOTE — Addendum Note (Signed)
Addended by: Emelia Salisbury C on: 10/03/2018 09:50 AM   Modules accepted: Orders

## 2018-10-03 NOTE — Assessment & Plan Note (Signed)
Stable control.. Wean down off meds as able.

## 2018-10-03 NOTE — Assessment & Plan Note (Signed)
Likely related to allergies. Has decreasedin size over time. No red flags.  Treat with antihistamine and time. If not resolving in 1-2 months.. eval with labs.

## 2018-10-03 NOTE — Patient Instructions (Addendum)
Keep working on healthy lifestyle, stress reduction and reduce depression medication as able.  Start claritin daily x 1-2 weeks. If not improving can try instead zyrtec at bedtime.  Add topical triamcinolone cream to back twice daily 2 weeks.  Daily moisturizer.. Cream instead lotion ( no alcohol, no dye, no scent) like Eucerin or Cetaphil cream.  Follow up in 1-2 month if lymph node and ithcing not resolved.

## 2018-10-03 NOTE — Assessment & Plan Note (Signed)
Likely allergic and dry skin related. Moisturize, topical steroid and antihistamine. Consider further work up with labs if not improving in 1-2 weeks.

## 2018-10-03 NOTE — Progress Notes (Signed)
Subjective:    Patient ID: Meghan Kerr, female    DOB: April 13, 1951, 67 y.o.   MRN: [id number]  HPI   67 year old female presetns for follow up depression.  MDD, moderate; she is on venlafaxine low dose ( she has weaned down to only one day a week 75 mg)  . She has been having more family issues, she is worried about health issues. Always seems like some one getting ill or sick, passing away.    Finds herself thinking morbidly.  No Si, NO HI.  Sleeping well at night with melatonin.  No issue with motivation.  Some decreased energy.   She has been having itching in last month. ON central back and on back of scalp. Alson noted small lymph node on left posterior neck.. Now decreased some in size, no pain.  No new exposure.. She has been doing yard work putting in Gerty.  No rash noted. She has history of dry skin... Worse in fall in past. Has tried gold bond, cortisone cream OTC.  Old triamcinolone helped some.  Blood pressure 120/62, pulse 60, temperature 98 F (36.7 C), temperature source Oral, height 5' 4.5" (1.638 m), weight 160 lb 8 oz (72.8 kg). Social History /Family History/Past Medical History reviewed in detail and updated in EMR if needed.   Review of Systems     Objective:   Physical Exam  Constitutional: Vital signs are normal. She appears well-developed and well-nourished. She is cooperative.  Non-toxic appearance. She does not appear ill. No distress.  HENT:  Head: Normocephalic.  Right Ear: Hearing, tympanic membrane, external ear and ear canal normal.  Left Ear: Hearing, tympanic membrane, external ear and ear canal normal.  Nose: Nose normal.  Eyes: Pupils are equal, round, and reactive to light. Conjunctivae, EOM and lids are normal. Lids are everted and swept, no foreign bodies found.  Neck: Trachea normal and normal range of motion. Neck supple. Carotid bruit is not present. No thyroid mass and no thyromegaly present.  Cardiovascular: Normal rate,  regular rhythm, S1 normal, S2 normal, normal heart sounds and intact distal pulses. Exam reveals no gallop.  No murmur heard. Pulmonary/Chest: Effort normal and breath sounds normal. No respiratory distress. She has no wheezes. She has no rhonchi. She has no rales.  Abdominal: Soft. Normal appearance and bowel sounds are normal. She exhibits no distension, no fluid wave, no abdominal bruit and no mass. There is no hepatosplenomegaly. There is no tenderness. There is no rebound, no guarding and no CVA tenderness. No hernia.  Lymphadenopathy:       Head (right side): Occipital adenopathy present. No submental, no submandibular, no tonsillar, no preauricular and no posterior auricular adenopathy present.       Head (left side): No submental, no submandibular, no tonsillar, no preauricular, no posterior auricular and no occipital adenopathy present.    She has no cervical adenopathy.       Right: No supraclavicular and no epitrochlear adenopathy present.       Left: No supraclavicular and no epitrochlear adenopathy present.   Soft mobile non tender node in left occiput.. solitary  Neurological: She is alert. She has normal strength. No cranial nerve deficit or sensory deficit.  Skin: Skin is warm, dry and intact. No rash noted.  Psychiatric: Her speech is normal and behavior is normal. Judgment normal. Her mood appears not anxious. Cognition and memory are normal. She does not exhibit a depressed mood.  Assessment & Plan:

## 2018-10-05 ENCOUNTER — Other Ambulatory Visit: Payer: Self-pay | Admitting: Family Medicine

## 2018-10-23 ENCOUNTER — Other Ambulatory Visit: Payer: Self-pay | Admitting: Family Medicine

## 2018-11-15 ENCOUNTER — Other Ambulatory Visit: Payer: Self-pay | Admitting: Family Medicine

## 2018-11-28 ENCOUNTER — Other Ambulatory Visit: Payer: Self-pay | Admitting: Family Medicine

## 2018-12-04 ENCOUNTER — Encounter: Payer: Self-pay | Admitting: Family Medicine

## 2018-12-04 ENCOUNTER — Ambulatory Visit: Payer: Medicare Other | Admitting: Family Medicine

## 2018-12-04 VITALS — BP 152/74 | HR 79 | Temp 97.9°F | Wt 162.2 lb

## 2018-12-04 DIAGNOSIS — R221 Localized swelling, mass and lump, neck: Secondary | ICD-10-CM | POA: Diagnosis not present

## 2018-12-04 DIAGNOSIS — L309 Dermatitis, unspecified: Secondary | ICD-10-CM

## 2018-12-04 MED ORDER — HYDROXYZINE PAMOATE 25 MG PO CAPS: 25.0000 mg | ORAL_CAPSULE | Freq: Three times a day (TID) | ORAL | 0 refills | Status: DC | PRN

## 2018-12-04 NOTE — Progress Notes (Signed)
   Subjective:     Meghan Kerr is a 67 y.o. female presenting for Skin Problem (Originally was seen on 10/03/18 with Dr Diona Browner and then saw Dr Moye-dermatologist and was put on Clobetasol ointment and Hydrocortisone cream. She is still having itching in different parts of her body and now felt lumps/knots in the back of her head.)     HPI  #Skin rash - used ointment and claritin w/o improvement - saw the Dermatologist and was put on clobetasol and hydrocortisone - continues to itch - scalp now very itchy - now with lumps which started on the base of the neck - lumps started on Sunday - tender to the touch - Dermatologist thought it was dermatitis  - some were improving with treatment  - no improvement with claritin  No headaches No shoulder pain    Review of Systems  Constitutional: Negative for chills and fever.  Gastrointestinal: Negative for nausea and vomiting.  Skin: Positive for rash.       Itchy skin     Social History   Tobacco Use  Smoking Status Never Smoker  Smokeless Tobacco Never Used        Objective:    BP Readings from Last 3 Encounters:  12/04/18 (!) 152/74  10/03/18 120/62  07/20/18 130/68   Wt Readings from Last 3 Encounters:  12/04/18 162 lb 4 oz (73.6 kg)  10/03/18 160 lb 8 oz (72.8 kg)  07/20/18 159 lb (72.1 kg)    BP (!) 152/74   Pulse 79   Temp 97.9 F (36.6 C)   Wt 162 lb 4 oz (73.6 kg)   SpO2 98%   BMI 27.42 kg/m    Physical Exam  Constitutional: She appears well-developed and well-nourished. No distress.  HENT:  Right Ear: External ear normal.  Left Ear: External ear normal.  Nose: Nose normal.  Eyes: Conjunctivae and EOM are normal.  Neck: Neck supple.  Left posterior neck with 5 mm non-mobile nodule embedded in the muscle  Cardiovascular: Normal rate.  Pulmonary/Chest: Effort normal.  Neurological: She is alert.  Skin: Skin is warm and dry. Capillary refill takes less than 2 seconds. She is not  diaphoretic.  Several dried and healing ulcers with faint surrounding erythema on the back and base of the skull.   Psychiatric: She has a normal mood and affect.          Assessment & Plan:   Problem List Items Addressed This Visit    None    Visit Diagnoses    Dermatitis    -  Primary   Relevant Medications   hydrOXYzine (VISTARIL) 25 MG capsule   Neck nodule         Neck nodule - suspect some sort of muscle strain and swelling. Recommended watch and wait. Reassurance that not infected  Dermatitis - recommended return to derm - trial of zyrtec as Claritin did not work and hydroxyzine for itching at night.   BP elevated - will reassess at next visit.  Return if symptoms worsen or fail to improve.  Lesleigh Noe, MD

## 2018-12-04 NOTE — Patient Instructions (Addendum)
Consider switching allergy medication to help with itching.  -- try Zyrtec (store brand OK) -- Try Hydroxyzine at night if not too expensive  Neck/Head Lumps -- Consider massage -- gentle neck stretching -- for the lump on the left side: watch this one, if it is growing in size or has redness over the skin or you develop fevers or chills  Skin rash -- Let the dermatologist know you are not getting better -- call to see if they want to see you or if you should change medication

## 2018-12-05 ENCOUNTER — Telehealth: Payer: Self-pay | Admitting: *Deleted

## 2018-12-05 DIAGNOSIS — L309 Dermatitis, unspecified: Secondary | ICD-10-CM

## 2018-12-05 MED ORDER — HYDROXYZINE PAMOATE 25 MG PO CAPS: 25.0000 mg | ORAL_CAPSULE | Freq: Three times a day (TID) | ORAL | 0 refills | Status: DC | PRN

## 2018-12-05 NOTE — Telephone Encounter (Signed)
Spoke with patient about getting this medication at [facility] and paying cash for it, should be $4. 28. Patient states she would like to get RX from [address] then. RX re sent.  Patient is going to follow up with Dr Moye-dermatologist, tomorrow 12/06/18. Patient states the lumps in her neck are bigger today. Just wanted to let you know about this.

## 2018-12-05 NOTE — Addendum Note (Signed)
Addended by: Kris Mouton on: 12/05/2018 04:12 PM   Modules accepted: Orders

## 2018-12-05 NOTE — Telephone Encounter (Signed)
Received fax from [facility] requesting PA for hydroxyzine.  PA completed on CoverMyMeds.  Sent for review.  Can take up to 72 hours for a decision.

## 2018-12-13 ENCOUNTER — Telehealth: Payer: Self-pay | Admitting: Family Medicine

## 2018-12-13 NOTE — Telephone Encounter (Signed)
Information faxed to [facility] at [facility] as requested.

## 2018-12-13 NOTE — Telephone Encounter (Deleted)
What medication

## 2018-12-13 NOTE — Telephone Encounter (Signed)
Need a start date for all pt's medications. Please fax to [phone]

## 2018-12-18 ENCOUNTER — Other Ambulatory Visit: Payer: Self-pay | Admitting: Family Medicine

## 2018-12-18 DIAGNOSIS — L309 Dermatitis, unspecified: Secondary | ICD-10-CM

## 2018-12-18 MED ORDER — HYDROXYZINE PAMOATE 25 MG PO CAPS: 25.0000 mg | ORAL_CAPSULE | Freq: Three times a day (TID) | ORAL | 0 refills | Status: DC | PRN

## 2018-12-18 NOTE — Telephone Encounter (Signed)
Pt was seen by Dr.Cody on 12/9 due to lumps and skin irritation she had. Pt had been going to [facility] and felt that nothing was working. Dr.Cody prescribed her hydroxyzine. Pt stated the prescription Dr.Cody prescribed worked tremendously. She will be out of capsules the day after christmas and is requesting more refills. Pt uses Product/process development scientist.

## 2018-12-18 NOTE — Telephone Encounter (Signed)
Recommend follow-up with PCP for further refills

## 2018-12-18 NOTE — Telephone Encounter (Signed)
Please review. Medication pulled down for refill if approved. Thank you

## 2018-12-19 MED ORDER — HYDROXYZINE PAMOATE 25 MG PO CAPS: 25.0000 mg | ORAL_CAPSULE | Freq: Three times a day (TID) | ORAL | 0 refills | Status: DC | PRN

## 2018-12-19 NOTE — Addendum Note (Signed)
Addended by: Kris Mouton on: 12/19/2018 09:03 AM   Modules accepted: Orders

## 2018-12-19 NOTE — Telephone Encounter (Signed)
Pt advised and expressed understanding. Patient [facility], RX re sent.

## 2018-12-22 ENCOUNTER — Telehealth: Payer: Self-pay

## 2018-12-22 NOTE — Telephone Encounter (Signed)
Pt said still having problem with rash and itching on scalp and back. Pt is seen [place] and dermatologist  Wants to know dates started benzapril-HCTZ (per pt chart was on office note 03/15/2008), venlafaxine (per pt chart was on at office visit 07/31/2008) and crestor (per pt chart was on at office visit 02/27/2009.)  Pt also wanted to know what would cause hot spot between pts ribs and hip. I advised pt I did not know but I could schedule appt for pt. Pt said would see how doing next week and pt will cb if needed.

## 2018-12-25 ENCOUNTER — Encounter: Payer: Self-pay | Admitting: *Deleted

## 2018-12-25 NOTE — Telephone Encounter (Addendum)
Spoke with Meghan Kerr.  She needs documentation of medication start dates.  Letter with medication start dates written for patient to pick up at the front desk.  After reviewing Meghan Kerr's record I did come across different start dates as previous documented by Meghan Kerr (See Letter).  I also printed out all office notes that were related to rash for patient to also have for her records.

## 2018-12-25 NOTE — Telephone Encounter (Signed)
Pt called office today stating she needs a print out of the dates she started taking the medication for her skin irritation. Pt wants to know if she can come by before Friday. Please see previous note. Best cb [phone]

## 2019-02-12 ENCOUNTER — Other Ambulatory Visit: Payer: Self-pay

## 2019-02-12 DIAGNOSIS — L309 Dermatitis, unspecified: Secondary | ICD-10-CM

## 2019-02-12 NOTE — Telephone Encounter (Signed)
[facility] Dr .Einar Pheasant. Patient was started on this by Dr .Einar Pheasant on 12/05/2019. Per Refill note on 12/18/2018 patient was advised that follow up with her  PCP for further refills would be needed. No future appointments are scheduled except for June/July 2020 for physical. Please review. (looks like per another telephone note that patient is seen [place] for this issue?)

## 2019-02-13 ENCOUNTER — Telehealth: Payer: Self-pay | Admitting: *Deleted

## 2019-02-13 ENCOUNTER — Other Ambulatory Visit: Payer: Self-pay | Admitting: Family Medicine

## 2019-02-13 DIAGNOSIS — L309 Dermatitis, unspecified: Secondary | ICD-10-CM

## 2019-02-13 MED ORDER — HYDROXYZINE PAMOATE 25 MG PO CAPS: 25.0000 mg | ORAL_CAPSULE | Freq: Three times a day (TID) | ORAL | 0 refills | Status: DC | PRN

## 2019-02-13 NOTE — Telephone Encounter (Signed)
[facility] for Hydroxyzine 25 mg.  PA completed on CoverMyMeds.  Sent for review.  Can take up to 72 hours for a decision.

## 2019-02-14 LAB — HM DIABETES EYE EXAM

## 2019-02-14 NOTE — Telephone Encounter (Signed)
PA was denied.  Your have to try and fail,contraindicate or be intolerant to both levocetiriziene and topical alclometasone.  [facility] notified of denial and advised that patient can pay out of pocket for.  Looks like back in December she got it from [place] and paid $4.28 out of pocket.

## 2019-03-01 ENCOUNTER — Ambulatory Visit: Payer: Medicare Other | Admitting: Family Medicine

## 2019-03-16 ENCOUNTER — Other Ambulatory Visit: Payer: Self-pay | Admitting: Family Medicine

## 2019-03-26 ENCOUNTER — Other Ambulatory Visit: Payer: Self-pay | Admitting: Family Medicine

## 2019-03-26 DIAGNOSIS — Z1231 Encounter for screening mammogram for malignant neoplasm of breast: Secondary | ICD-10-CM

## 2019-04-10 ENCOUNTER — Other Ambulatory Visit: Payer: Self-pay | Admitting: Family Medicine

## 2019-04-12 ENCOUNTER — Other Ambulatory Visit: Payer: Self-pay | Admitting: Family Medicine

## 2019-05-29 ENCOUNTER — Other Ambulatory Visit: Payer: Self-pay

## 2019-05-29 ENCOUNTER — Ambulatory Visit
Admission: RE | Admit: 2019-05-29 | Discharge: 2019-05-29 | Disposition: A | Discharge status: Home / Self Care | Payer: Medicare Other | Source: Ambulatory Visit | Attending: Family Medicine | Admitting: Family Medicine

## 2019-05-29 DIAGNOSIS — Z1231 Encounter for screening mammogram for malignant neoplasm of breast: Secondary | ICD-10-CM | POA: Diagnosis not present

## 2019-06-13 ENCOUNTER — Telehealth: Payer: Self-pay | Admitting: Family Medicine

## 2019-06-13 DIAGNOSIS — E119 Type 2 diabetes mellitus without complications: Secondary | ICD-10-CM

## 2019-06-13 MED ORDER — ONETOUCH ULTRA MINI W/DEVICE KIT: PACK | 0 refills | Status: DC

## 2019-06-13 MED ORDER — ONETOUCH ULTRA VI STRP: ORAL_STRIP | 3 refills | Status: DC

## 2019-06-13 MED ORDER — ONETOUCH ULTRASOFT LANCETS MISC: 3 refills | Status: DC

## 2019-06-13 NOTE — Telephone Encounter (Signed)
Patient stated that her meter has stopped working and her lancets/strips have expired. Patient is using the [place]   She spoke with the pharmacy and they advised her to call our office to have new prescriptions sent in for these [facility]     [phone].

## 2019-06-13 NOTE — Telephone Encounter (Signed)
Prescriptions for meter, test strips and [facility]ets [facility] [place].Mickel Baas notified by telephone.

## 2019-06-19 ENCOUNTER — Telehealth: Payer: Self-pay | Admitting: Family Medicine

## 2019-06-19 DIAGNOSIS — E782 Mixed hyperlipidemia: Secondary | ICD-10-CM

## 2019-06-19 DIAGNOSIS — M8589 Other specified disorders of bone density and structure, multiple sites: Secondary | ICD-10-CM

## 2019-06-19 DIAGNOSIS — E119 Type 2 diabetes mellitus without complications: Secondary | ICD-10-CM

## 2019-06-19 NOTE — Telephone Encounter (Signed)
-----   Message from Cloyd Stagers, RT sent at 06/14/2019  3:46 PM EDT ----- Regarding: Lab Orders for Wednesday 6.24.2020 Please place lab orders for, office visit for physical on Tuesday 7.28.2020 Thank you, Dyke Maes RT(R)

## 2019-06-20 ENCOUNTER — Ambulatory Visit (INDEPENDENT_AMBULATORY_CARE_PROVIDER_SITE_OTHER): Payer: Medicare Other

## 2019-06-20 ENCOUNTER — Other Ambulatory Visit (INDEPENDENT_AMBULATORY_CARE_PROVIDER_SITE_OTHER): Payer: Medicare Other

## 2019-06-20 VITALS — Wt 153.0 lb

## 2019-06-20 DIAGNOSIS — E782 Mixed hyperlipidemia: Secondary | ICD-10-CM | POA: Diagnosis not present

## 2019-06-20 DIAGNOSIS — M8589 Other specified disorders of bone density and structure, multiple sites: Secondary | ICD-10-CM | POA: Diagnosis not present

## 2019-06-20 DIAGNOSIS — E119 Type 2 diabetes mellitus without complications: Secondary | ICD-10-CM

## 2019-06-20 DIAGNOSIS — Z0000 Encounter for general adult medical examination without abnormal findings: Secondary | ICD-10-CM | POA: Diagnosis not present

## 2019-06-20 LAB — COMPREHENSIVE METABOLIC PANEL
ALT: 11 U/L (ref 0–35)
AST: 15 U/L (ref 0–37)
Albumin: 4.2 g/dL (ref 3.5–5.2)
Alkaline Phosphatase: 73 U/L (ref 39–117)
BUN: 13 mg/dL (ref 6–23)
CO2: 31 mEq/L (ref 19–32)
Calcium: 9.3 mg/dL (ref 8.4–10.5)
Chloride: 102 mEq/L (ref 96–112)
Creatinine, Ser: 0.75 mg/dL (ref 0.40–1.20)
GFR: 76.43 mL/min (ref >60.00)
Glucose, Bld: 112 mg/dL — ABNORMAL HIGH (ref 70–99)
Potassium: 4 mEq/L (ref 3.5–5.1)
Sodium: 140 mEq/L (ref 135–145)
Total Bilirubin: 0.6 mg/dL (ref 0.2–1.2)
Total Protein: 6.7 g/dL (ref 6.0–8.3)

## 2019-06-20 LAB — LIPID PANEL
Cholesterol: 166 mg/dL (ref 0–200)
HDL: 61.1 mg/dL (ref >39.00)
LDL Cholesterol: 90 mg/dL (ref 0–99)
NonHDL: 104.9
Total CHOL/HDL Ratio: 3
Triglycerides: 76 mg/dL (ref 0.0–149.0)
VLDL: 15.2 mg/dL (ref 0.0–40.0)

## 2019-06-20 LAB — VITAMIN D 25 HYDROXY (VIT D DEFICIENCY, FRACTURES): VITD: 37.45 ng/mL (ref 30.00–100.00)

## 2019-06-20 LAB — HEMOGLOBIN A1C: Hgb A1c MFr Bld: 6.5 % (ref 4.6–6.5)

## 2019-06-20 NOTE — Progress Notes (Signed)
PCP notes:   Health maintenance:  Tetanus vaccine - postponed/insurance  Abnormal screenings:   None  Patient concerns:   Chronic rash with tinging sensation  Nurse concerns:  None  Next PCP appt:   07/24/19 @ 1000

## 2019-06-20 NOTE — Patient Instructions (Signed)
Meghan Kerr , Thank you for taking time to come for your Medicare Wellness Visit. I appreciate your ongoing commitment to your health goals. Please review the following plan we discussed and let me know if I can assist you in the future.   These are the goals we discussed: Goals    . DIET - INCREASE WATER INTAKE     Starting 06/08/2018, I will attempt to drink at least 6-8 glasses of water daily.        This is a list of the screening recommended for you and due dates:  Health Maintenance  Topic Date Due  . Tetanus Vaccine  12/26/2020*  . Eye exam for diabetics  07/13/2019  . Complete foot exam   07/21/2019  . Flu Shot  07/28/2019  . Hemoglobin A1C  12/20/2019  . Cologuard (Stool DNA test)  04/13/2020  . Mammogram  05/28/2021  . DEXA scan (bone density measurement)  Completed  .  Hepatitis C: One time screening is recommended by Center for Disease Control  (CDC) for  adults born from [birth year] through 1967.   Completed  . Pneumonia vaccines  Completed  *Topic was postponed. The date shown is not the original due date.   Preventive Care for Adults  A healthy lifestyle and preventive care can promote health and wellness. Preventive health guidelines for adults include the following key practices.  . A routine yearly physical is a good way to check with your health care provider about your health and preventive screening. It is a chance to share any concerns and updates on your health and to receive a thorough exam.  . Visit your dentist for a routine exam and preventive care every 6 months. Brush your teeth twice a day and floss once a day. Good oral hygiene prevents tooth decay and gum disease.  . The frequency of eye exams is based on your age, health, family medical history, use  of contact lenses, and other factors. Follow your health care provider's recommendations for frequency of eye exams.  . Eat a healthy diet. Foods like vegetables, fruits, whole grains, low-fat dairy  products, and lean protein foods contain the nutrients you need without too many calories. Decrease your intake of foods high in solid fats, added sugars, and salt. Eat the right amount of calories for you. Get information about a proper diet from your health care provider, if necessary.  . Regular physical exercise is one of the most important things you can do for your health. Most adults should get at least 150 minutes of moderate-intensity exercise (any activity that increases your heart rate and causes you to sweat) each week. In addition, most adults need muscle-strengthening exercises on 2 or more days a week.  Silver Sneakers may be a benefit available to you. To determine eligibility, you may visit the website: www.silversneakers.com or contact program at (949)704-3855 Mon-Fri between 8AM-8PM.   . Maintain a healthy weight. The body mass index (BMI) is a screening tool to identify possible weight problems. It provides an estimate of body fat based on height and weight. Your health care provider can find your BMI and can help you achieve or maintain a healthy weight.   For adults 20 years and older: ? A BMI below 18.5 is considered underweight. ? A BMI of 18.5 to 24.9 is normal. ? A BMI of 25 to 29.9 is considered overweight. ? A BMI of 30 and above is considered obese.   . Maintain normal blood lipids and  cholesterol levels by exercising and minimizing your intake of saturated fat. Eat a balanced diet with plenty of fruit and vegetables. Blood tests for lipids and cholesterol should begin at age 38 and be repeated every 5 years. If your lipid or cholesterol levels are high, you are over 50, or you are at high risk for heart disease, you may need your cholesterol levels checked more frequently. Ongoing high lipid and cholesterol levels should be treated with medicines if diet and exercise are not working.  . If you smoke, find out from your health care provider how to quit. If you do not  use tobacco, please do not start.  . If you choose to drink alcohol, please do not consume more than 2 drinks per day. One drink is considered to be 12 ounces (355 mL) of beer, 5 ounces (148 mL) of wine, or 1.5 ounces (44 mL) of liquor.  . If you are 20-62 years old, ask your health care provider if you should take aspirin to prevent strokes.  . Use sunscreen. Apply sunscreen liberally and repeatedly throughout the day. You should seek shade when your shadow is shorter than you. Protect yourself by wearing long sleeves, pants, a wide-brimmed hat, and sunglasses year round, whenever you are outdoors.  . Once a month, do a whole body skin exam, using a mirror to look at the skin on your back. Tell your health care provider of new moles, moles that have irregular borders, moles that are larger than a pencil eraser, or moles that have changed in shape or color.

## 2019-06-20 NOTE — Progress Notes (Signed)
Subjective:   Meghan Kerr is a 68 y.o. female who presents for Medicare Annual (Subsequent) preventive examination.  Review of Systems:  N/A Cardiac Risk Factors include: advanced age (>66mn, >>77women);diabetes mellitus;dyslipidemia;hypertension     Objective:     Vitals: Wt 153 lb (69.4 kg) Comment: patient supplied  BMI 25.86 kg/m   Body mass index is 25.86 kg/m.  Advanced Directives 06/20/2019 06/08/2018 12/03/2016  Does Patient Have a Medical Advance Directive? No No Yes  Type of Advance Directive (No Data) - [place]  Does patient want to make changes to medical advance directive? No - Patient declined - -  Copy of [place]? - - No - copy requested  Would patient like information on creating a medical advance directive? No - Patient declined Yes (MAU/Ambulatory/Procedural Areas - Information given) -    Tobacco Social History   Tobacco Use  Smoking Status Never Smoker  Smokeless Tobacco Never Used     Counseling given: No   Clinical Intake:  Pre-visit preparation completed: Yes  Pain : No/denies pain Pain Score: 0-No pain     Nutritional Status: BMI 25 -29 Overweight Nutritional Risks: None Diabetes: No  How often do you need to have someone help you when you read instructions, pamphlets, or other written materials from your doctor or pharmacy?: 1 - Never What is the last grade level you completed in school?: Bachelor degree  Interpreter Needed?: No  Comments: pt lives with spouse Information entered by :: LPinson, RN  Past Medical History:  Diagnosis Date  . Hyperlipidemia   . Hypertension    Past Surgical History:  Procedure Laterality Date  . APPENDECTOMY    . [place] . HAMMER TOE SURGERY Left 08/19/2017   hardware removed April 2019  . TRIGGER FINGER RELEASE  2006  . WRIST FRACTURE SURGERY     broken left wrist repair with plate   Family History   Problem Relation Age of Onset  . Diabetes Mother   . Heart disease Mother        cad and chf  . Heart disease Father        heart attack (massive)  . Cancer Maternal Grandmother        COLON  . Stroke Maternal Grandfather   . Diabetes Brother   . Heart disease Brother 464      hert attack  . Heart disease Brother 782      CABG   . Stroke Sister   . Heart disease Sister        VALVE REPLACEMENT  . Breast cancer Cousin 56  Social History   Socioeconomic History  . Marital status: Married    Spouse name: Not on file  . Number of children: Not on file  . Years of education: Not on file  . Highest education level: Not on file  Occupational History  . Occupation: TEACHER    Comment: Kindergarten  Social Needs  . Financial resource strain: Not on file  . Food insecurity    Worry: Not on file    Inability: Not on file  . Transportation needs    Medical: Not on file    Non-medical: Not on file  Tobacco Use  . Smoking status: Never Smoker  . Smokeless tobacco: Never Used  Substance and Sexual Activity  . Alcohol use: No  . Drug use: No  . Sexual activity: Not Currently  Lifestyle  . Physical activity    Days per week: Not on file    Minutes per session: Not on file  . Stress: Not on file  Relationships  . Social Herbalist on phone: Not on file    Gets together: Not on file    Attends religious service: Not on file    Active member of club or organization: Not on file    Attends meetings of clubs or organizations: Not on file    Relationship status: Not on file  Other Topics Concern  . Not on file  Social History Narrative  . Not on file    Outpatient Encounter Medications as of 06/20/2019  Medication Sig  . b complex vitamins tablet Take 1 tablet by mouth daily.    . benazepril-hydrochlorthiazide (LOTENSIN HCT) 20-12.5 MG tablet TAKE ONE HALF TABLET BY MOUTH DAILY.  Marland Kitchen Blood Glucose Monitoring Suppl (ONE TOUCH ULTRA MINI) w/Device KIT USE TO CHECK  BLOOD SUGAR DAILY  . clobetasol ointment (TEMOVATE) 0.05 % APPLY ON THE SKIN AS DIRECTED DAILY TO RASH UP TO 2 WEEKS. AVOID FACE, GROIN,UNDERARMS.  Marland Kitchen glucose blood (ONETOUCH ULTRA) test strip USE TO CHECK BLOOD SUGAR DAILY  . hydrocortisone 2.5 % cream APPLY 1 APPLICATION ON THE SKIN DAILY TO FACE AS NEEDED  . hydrOXYzine (VISTARIL) 25 MG capsule TAKE 1 CAPSULE (25 MG TOTAL) BY MOUTH 3 (THREE) TIMES DAILY AS NEEDED FOR ITCHING.  . Lancets (ONETOUCH ULTRASOFT) lancets USE TO CHECK BLOOD SUGAR DAILY  . Nutritional Supplements (JUICE PLUS FIBRE PO) Take by mouth daily.  . rosuvastatin (CRESTOR) 5 MG tablet TAKE 1 TABLET BY MOUTH EVERY DAY (Patient taking differently: Take 5 mg by mouth every other day. )  . venlafaxine XR (EFFEXOR-XR) 37.5 MG 24 hr capsule TAKE 1 CAPSULE BY MOUTH EVERY DAY  . Vitamin D, Cholecalciferol, 10 MCG (400 UNIT) CHEW Chew 1 tablet by mouth daily.   No facility-administered encounter medications on file as of 06/20/2019.     Activities of Daily Living In your present state of health, do you have any difficulty performing the following activities: 06/20/2019  Hearing? N  Vision? N  Difficulty concentrating or making decisions? N  Walking or climbing stairs? N  Dressing or bathing? N  Doing errands, shopping? N  Preparing Food and eating ? N  Using the Toilet? N  In the past six months, have you accidently leaked urine? N  Do you have problems with loss of bowel control? N  Managing your Medications? N  Managing your Finances? N  Housekeeping or managing your Housekeeping? N  Some recent data might be hidden    Patient Care Team: Jinny Sanders, MD as PCP - General Edison Pace Josie Saunders, MD as Consulting Physician (Ophthalmology)    Assessment:   This is a routine wellness examination for Meghan Kerr.   Hearing Screening   _0  _1  _2  _3  _4  _5  _6  _7  _8   Right ear:           Left ear:           Vision Screening Comments: Vision exam in  2020 with Dr. Edison Pace   Exercise Activities and Dietary recommendations Current Exercise Habits: The patient does not participate in regular exercise at present, Exercise limited by: None identified  Goals    . DIET - INCREASE WATER INTAKE     Starting 06/08/2018, I will attempt to drink at least 6-8 glasses of water daily.  Fall Risk Fall Risk  06/20/2019 06/08/2018 12/03/2016 12/01/2015 09/13/2014  Falls in the past year? 0 No Yes No Yes  Number falls in past yr: - - 1 - 2 or more  Injury with Fall? - - No - -  Risk for fall due to : - - - - History of fall(s)   Depression Screen PHQ 2/9 Scores 06/20/2019 06/08/2018 06/21/2017 12/03/2016  PHQ - 2 Score 0 0 0 0  PHQ- 9 Score 0 0 - -     Cognitive Function MMSE - Mini Mental State Exam 06/20/2019 06/08/2018  Orientation to time 5 5  Orientation to Place 5 5  Registration 3 3  Attention/ Calculation 0 0  Recall 3 3  Language- name 2 objects 0 0  Language- repeat 1 1  Language- follow 3 step command 0 3  Language- read & follow direction 0 0  Write a sentence 0 0  Copy design 0 0  Total score 17 20    PLEASE NOTE: A Mini-Cog screen was completed. Maximum score is 17. A value of 0 denotes this part of [place] MMSE was not completed or the patient failed this part of the Mini-Cog screening.   Mini-Cog Screening Orientation to Time - Max 5 pts Orientation to Place - Max 5 pts Registration - Max 3 pts Recall - Max 3 pts Language Repeat - Max 1 pts    Immunization History  Administered Date(s) Administered  . Influenza Whole 10/24/2008, 10/23/2010  . Influenza,inj,Quad PF,6+ Mos 09/13/2014, 01/15/2015, 12/01/2015, 12/03/2016, 10/26/2017, 10/03/2018  . Pneumococcal Conjugate-13 09/13/2014  . Pneumococcal Polysaccharide-23 10/24/2008, 01/15/2015  . Td 12/27/2005   Screening Tests Health Maintenance  Topic Date Due  . TETANUS/TDAP  12/26/2020 (Originally 12/28/2015)  . OPHTHALMOLOGY EXAM  07/13/2019  . FOOT EXAM   07/21/2019  . INFLUENZA VACCINE  07/28/2019  . HEMOGLOBIN A1C  12/20/2019  . Fecal DNA (Cologuard)  04/13/2020  . MAMMOGRAM  05/28/2021  . DEXA SCAN  Completed  . Hepatitis C Screening  Completed  . PNA vac Low Risk Adult  Completed       Plan:    I have personally reviewed, addressed, and noted the following in the patient's chart:  A. Medical and social history B. Use of alcohol, tobacco or illicit drugs  C. Current medications and supplements D. Functional ability and status E.  Nutritional status F.  Physical activity G. Advance directives H. List of other physicians I.  Hospitalizations, surgeries, and ER visits in previous 12 months J.  Vitals (unless it is a telemedicine encounter) K. Screenings to include cognitive, depression, hearing, vision (NOTE: hearing and vision screenings not completed in telemedicine encounter) L. Referrals and appointments   In addition, I have reviewed and discussed with patient certain preventive protocols, quality metrics, and best practice recommendations. A written personalized care plan for preventive services and recommendations were provided to patient.  With patient's permission, we connected on 06/20/19 at 10:00 AM EDT. Interactive audio and video telecommunications were attempted with patient. This attempt was unsuccessful due to patient having technical difficulties OR patient did not have access to video capability.  Encounter was completed with audio only.  Two patient identifiers were used to ensure the encounter occurred with the correct person. Patient was in home and writer was in office.     Signed,   Lindell Noe, MHA, BS, RN Health Coach

## 2019-06-20 NOTE — Progress Notes (Signed)
I reviewed health advisor's note, was available for consultation, and agree with documentation and plan.   Signed,  Anda Sobotta T. Kacper Cartlidge, MD  

## 2019-06-21 ENCOUNTER — Encounter: Payer: Medicare Other | Admitting: Family Medicine

## 2019-07-03 ENCOUNTER — Other Ambulatory Visit: Payer: Self-pay | Admitting: Family Medicine

## 2019-07-24 ENCOUNTER — Encounter: Payer: Self-pay | Admitting: Family Medicine

## 2019-07-24 ENCOUNTER — Other Ambulatory Visit: Payer: Self-pay

## 2019-07-24 ENCOUNTER — Ambulatory Visit (INDEPENDENT_AMBULATORY_CARE_PROVIDER_SITE_OTHER): Payer: Medicare Other | Admitting: Family Medicine

## 2019-07-24 VITALS — BP 112/64 | HR 60 | Temp 97.1°F | Ht 64.5 in | Wt 157.0 lb

## 2019-07-24 DIAGNOSIS — F331 Major depressive disorder, recurrent, moderate: Secondary | ICD-10-CM

## 2019-07-24 DIAGNOSIS — Z0000 Encounter for general adult medical examination without abnormal findings: Secondary | ICD-10-CM

## 2019-07-24 DIAGNOSIS — I10 Essential (primary) hypertension: Secondary | ICD-10-CM

## 2019-07-24 DIAGNOSIS — E119 Type 2 diabetes mellitus without complications: Secondary | ICD-10-CM | POA: Diagnosis not present

## 2019-07-24 LAB — HM DIABETES FOOT EXAM

## 2019-07-24 NOTE — Assessment & Plan Note (Signed)
Well controlled. Continue current medication. Encouraged exercise, weight loss, healthy eating habits.  

## 2019-07-24 NOTE — Assessment & Plan Note (Signed)
Well controlled. Continue current medication.  

## 2019-07-24 NOTE — Progress Notes (Signed)
Chief Complaint  Patient presents with  . Annual Exam    Part 2    History of Present Illness: HPI  The patient presents for  complete physical and review of chronic health problems. He/She also has the following acute concerns today: none  The patient saw Candis Musa, LPN for medicare wellness. Note reviewed in detail and important notes copied below. Health maintenance: Tetanus vaccine - postponed/insurance Abnormal screenings:  None   07/24/19  Diabetes:   Good control on no medication. Lab Results  Component Value Date   HGBA1C 6.5 06/20/2019  Using medications without difficulties: Hypoglycemic episodes:none Hyperglycemic episodes:none Feet problems: no ulcers Blood Sugars averaging: FBS 102 eye exam within last year: had done 2-3 weeks ago.. has  Frequent injection in right eye.   Hypertension:    BP at goal on benazapril HCT BP Readings from Last 3 Encounters:  07/24/19 112/64  12/04/18 (!) 152/74  10/03/18 120/62  Using medication without problems or lightheadedness:  none Chest pain with exertion:none Edema:none Short of breath:none Average home BPs: Other issues:  Elevated Cholesterol:  LDL at goal < 100 on crestor 5 mg every other day Lab Results  Component Value Date   CHOL 166 06/20/2019   HDL 61.10 06/20/2019   LDLCALC 90 06/20/2019   LDLDIRECT 190.1 07/15/2011   TRIG 76.0 06/20/2019   CHOLHDL 3 06/20/2019  Using medications without problems: Muscle aches:  Diet compliance: moderate Exercise: none Other complaints:   MDD  Stable control on venlafaxine low dose.   Clinical Support from 06/20/2019 in [place] at [facility] Total Score  0       COVID 19 screen No recent travel or known exposure to COVID19 The patient denies respiratory symptoms of COVID 19 at this time.  The importance of social distancing was discussed today.   Review of Systems  Constitutional: Negative for chills and fever.  HENT:  Negative for congestion and ear pain.         Left face occ tingling ,headache in last week off and on  Eyes: Negative for pain and redness.  Respiratory: Negative for cough and shortness of breath.   Cardiovascular: Negative for chest pain, palpitations and leg swelling.  Gastrointestinal: Negative for abdominal pain, blood in stool, constipation, diarrhea, nausea and vomiting.  Genitourinary: Negative for dysuria.  Musculoskeletal: Negative for falls and myalgias.  Skin: Negative for rash.  Neurological: Negative for dizziness.  Psychiatric/Behavioral: Negative for depression. The patient is not nervous/anxious.       Past Medical History:  Diagnosis Date  . Hyperlipidemia   . Hypertension     reports that she has never smoked. She has never used smokeless tobacco. She reports that she does not drink alcohol or use drugs.   Current Outpatient Medications:  .  b complex vitamins tablet, Take 1 tablet by mouth daily.  , Disp: , Rfl:  .  benazepril-hydrochlorthiazide (LOTENSIN HCT) 20-12.5 MG tablet, TAKE ONE HALF TABLET BY MOUTH DAILY., Disp: 45 tablet, Rfl: 1 .  Blood Glucose Monitoring Suppl (ONE TOUCH ULTRA MINI) w/Device KIT, USE TO CHECK BLOOD SUGAR DAILY, Disp: 1 each, Rfl: 0 .  glucose blood (ONETOUCH ULTRA) test strip, USE TO CHECK BLOOD SUGAR DAILY, Disp: 100 each, Rfl: 3 .  hydrocortisone 2.5 % cream, APPLY 1 APPLICATION ON THE SKIN DAILY TO FACE AS NEEDED, Disp: , Rfl: 1 .  Lancets (ONETOUCH ULTRASOFT) lancets, USE TO CHECK BLOOD SUGAR DAILY, Disp: 100 each, Rfl: 3 .  Nutritional Supplements (JUICE PLUS FIBRE PO), Take by mouth daily., Disp: , Rfl:  .  predniSONE (DELTASONE) 5 MG tablet, TAKE 1 TABLET BY MOUTH AS DIRECTED PATIENT HAS WRITTEN INSTRUCTIONS., Disp: , Rfl:  .  rosuvastatin (CRESTOR) 5 MG tablet, TAKE 1 TABLET BY MOUTH EVERY DAY (Patient taking differently: Take 5 mg by mouth every other day. ), Disp: 90 tablet, Rfl: 1 .  triamcinolone ointment (KENALOG) 0.1 %,  Apply 1 application topically 2 (two) times daily., Disp: , Rfl:  .  venlafaxine XR (EFFEXOR-XR) 37.5 MG 24 hr capsule, TAKE 1 CAPSULE BY MOUTH EVERY DAY, Disp: 90 capsule, Rfl: 0 .  Vitamin D, Cholecalciferol, 10 MCG (400 UNIT) CHEW, Chew 1 tablet by mouth daily., Disp: , Rfl:    Observations/Objective: Blood pressure 112/64, pulse 60, temperature (!) 97.1 F (36.2 C), temperature source Temporal, height 5' 4.5" (1.638 m), weight 157 lb (71.2 kg), SpO2 96 %.  Physical Exam Constitutional:      General: She is not in acute distress.    Appearance: Normal appearance. She is well-developed. She is not ill-appearing or toxic-appearing.  HENT:     Head: Normocephalic.     Right Ear: Hearing, tympanic membrane, ear canal and external ear normal.     Left Ear: Hearing, tympanic membrane, ear canal and external ear normal.     Nose: Nose normal.  Eyes:     General: Lids are normal. Lids are everted, no foreign bodies appreciated.     Conjunctiva/sclera: Conjunctivae normal.     Pupils: Pupils are equal, round, and reactive to light.  Neck:     Musculoskeletal: Normal range of motion and neck supple.     Thyroid: No thyroid mass or thyromegaly.     Vascular: No carotid bruit.     Trachea: Trachea normal.  Cardiovascular:     Rate and Rhythm: Normal rate and regular rhythm.     Heart sounds: Normal heart sounds, S1 normal and S2 normal. No murmur. No gallop.   Pulmonary:     Effort: Pulmonary effort is normal. No respiratory distress.     Breath sounds: Normal breath sounds. No wheezing, rhonchi or rales.  Abdominal:     General: Bowel sounds are normal. There is no distension or abdominal bruit.     Palpations: Abdomen is soft. There is no fluid wave or mass.     Tenderness: There is no abdominal tenderness. There is no guarding or rebound.     Hernia: No hernia is present.  Lymphadenopathy:     Cervical: No cervical adenopathy.  Skin:    General: Skin is warm and dry.     Findings:  No rash.  Neurological:     Mental Status: She is alert.     Cranial Nerves: No cranial nerve deficit.     Sensory: No sensory deficit.  Psychiatric:        Mood and Affect: Mood is not anxious or depressed.        Speech: Speech normal.        Behavior: Behavior normal. Behavior is cooperative.        Judgment: Judgment normal.     Diabetic foot exam: Normal inspection No skin breakdown No calluses  Normal DP pulses Normal sensation to light touch and monofilament Nails normal   Assessment and Plan   The patient's preventative maintenance and recommended screening tests for an annual wellness exam were reviewed in full today. Brought up to date unless services declined.  Counselled  on the importance of diet, exercise, and its role in overall health and mortality. The patient's FH and SH was reviewed, including their home life, tobacco status, and drug and alcohol status.   Vaccines; Uptodate PNA, declined zostavax, . DVE/pap:pap not indicated, DVE not indicated  Colon: Date of Last Colonoscopy: 05/28/2007.. In [place] Results: Hyperplastic Polyp, recommended repeat in 5 years. She request stool ifob instead. Had ifob neg 05/2015.Plan cologuard.. nml 03/2017, repeat due in 2021 Mammo:05/2019 repeat in 1-2 years. DEXA: osteopenia stable T-1.8 spine 09/06/2018, repeat in 5 years Hep C: completed.   Eliezer Lofts, MD

## 2019-07-24 NOTE — Patient Instructions (Addendum)
  Ms. Boulos , Thank you for taking time to come for your Wellness Visit. I appreciate your ongoing commitment to your health goals. Please review the following plan we discussed and let me know if I can assist you in the future.     Continue working on healthy low carb diet and regular exercise.   This is a list of the screening recommended for you and due dates:  Health Maintenance  Topic Date Due  . Eye exam for diabetics  07/13/2019  . Complete foot exam   07/21/2019  . Tetanus Vaccine  12/26/2020*  . Flu Shot  07/28/2019  . Hemoglobin A1C  12/20/2019  . Cologuard (Stool DNA test)  04/13/2020  . Mammogram  05/28/2021  . DEXA scan (bone density measurement)  09/07/2023  .  Hepatitis C: One time screening is recommended by Center for Disease Control  (CDC) for  adults born from [birth year] through 1967.   Completed  . Pneumonia vaccines  Completed  *Topic was postponed. The date shown is not the original due date.

## 2019-07-24 NOTE — Assessment & Plan Note (Signed)
Diet controlled.  

## 2019-07-30 ENCOUNTER — Encounter: Payer: Self-pay | Admitting: Family Medicine

## 2019-08-24 ENCOUNTER — Telehealth: Payer: Self-pay

## 2019-08-24 ENCOUNTER — Ambulatory Visit: Payer: Medicare Other | Admitting: Family Medicine

## 2019-08-24 MED ORDER — PREDNISONE 10 MG PO TABS: ORAL_TABLET | ORAL | 0 refills | Status: DC

## 2019-08-24 MED ORDER — TRIAMCINOLONE ACETONIDE 0.5 % EX CREA: 1.0000 "application " | TOPICAL_CREAM | Freq: Two times a day (BID) | CUTANEOUS | 0 refills | Status: DC

## 2019-08-24 NOTE — Telephone Encounter (Signed)
Pt called back to say she already had some triamcinolone cream she has been using and it does not help. She called the pharmacy and cancelled that rx. She needs something different sent in to [facility]. She said the rash is spreading quickly.

## 2019-08-24 NOTE — Telephone Encounter (Signed)
Meghan Kerr notified by telephone that Dr. Diona Browner has sent her [facility] [place].

## 2019-08-24 NOTE — Telephone Encounter (Signed)
Will send in topical  steroid cream to apply to affected areas for itching.

## 2019-08-24 NOTE — Telephone Encounter (Signed)
Patient notified and verbalizes understanding.

## 2019-08-24 NOTE — Telephone Encounter (Addendum)
Pt called back and so much is going on right now and pt is concerned she could be driving at time of virtual appt and would not be able to do the appt and request appt cancelled. [facility] but does request note sent to a provider to see if can get something sent to [place]. Pt also wanted noted that she had a flu shot on 08/22/19 and wondered if that would cause a rash. Pt not having any difficulty breathing or swelling in mouth,tongue,lips or throat. Please advise.

## 2019-08-24 NOTE — Telephone Encounter (Signed)
Pt called in requesting that something be sent in for possible poison oak rash on abdomen, back and hand, itchy and is spreading... pt is currently watching grandkids and daughter may be going into labor, so unable to come into the office. Advise d pt that the rash will need to be seen and offered Doxy with Dr Damita Dunnings @ 12pm...Marland Kitchen please advise if not appropriate

## 2019-08-24 NOTE — Telephone Encounter (Signed)
Sent in rx for prednisone but if not improving pt needs to be seen.

## 2019-08-24 NOTE — Addendum Note (Signed)
Addended by: Eliezer Lofts E on: 08/24/2019 05:31 PM   Modules accepted: Orders

## 2019-08-24 NOTE — Telephone Encounter (Signed)
Noted. Thanks. I'll defer to PCP.

## 2019-08-30 NOTE — Telephone Encounter (Signed)
Given it is improving.. give it more time to resolve.

## 2019-08-30 NOTE — Telephone Encounter (Signed)
Best number [phone]  Pt stated rash was drying up but still present  And palms of her hands are itching.   Offered appointment pt wanted to know what she needs to do

## 2019-08-31 NOTE — Telephone Encounter (Signed)
Meghan Kerr notified as instructed by telephone.

## 2019-10-16 ENCOUNTER — Other Ambulatory Visit: Payer: Self-pay | Admitting: Family Medicine

## 2019-10-16 NOTE — Telephone Encounter (Signed)
Last office visit 07/24/2019 for CPE.  Last refilled 08/24/2019 for 30 g.  Next Appt: 01/25/2020 for 6 month follow up.

## 2019-10-18 ENCOUNTER — Telehealth: Payer: Self-pay | Admitting: Family Medicine

## 2019-10-18 NOTE — Telephone Encounter (Signed)
Meghan Kerr with [facility] is calling in regards to a request they faxed over to [place] on the 15th. They wanted to verify that we received the request.   Phone- [phone]

## 2019-10-19 NOTE — Telephone Encounter (Signed)
I have not received anything.

## 2019-10-22 NOTE — Telephone Encounter (Addendum)
Left message for Loraine that we did not receive the fax on 10/11/2019.  I ask that they refax forms to my fax machine at [phone].

## 2019-10-22 NOTE — Telephone Encounter (Signed)
It looks like a medical record release was received from [facility] and scanned into patient's chart.  They were requesting patient's most recent labs.  Lab results from 06/20/2019 faxed to [facility].

## 2019-10-25 NOTE — Telephone Encounter (Addendum)
Left message for Meghan Kerr at [facility] that no recent CBC or x-rays have been done on [name].

## 2019-10-25 NOTE — Telephone Encounter (Signed)
Estill Bamberg with [facility] calling in regards to patient records. They would like to know if the patient has an recent CBC or Xrays done. The Doctor is requesting them if possible

## 2019-12-13 ENCOUNTER — Other Ambulatory Visit: Payer: Self-pay | Admitting: Family Medicine

## 2019-12-18 ENCOUNTER — Other Ambulatory Visit: Payer: Self-pay | Admitting: Family Medicine

## 2020-01-17 ENCOUNTER — Telehealth: Payer: Self-pay | Admitting: Family Medicine

## 2020-01-17 DIAGNOSIS — E119 Type 2 diabetes mellitus without complications: Secondary | ICD-10-CM

## 2020-01-17 DIAGNOSIS — E782 Mixed hyperlipidemia: Secondary | ICD-10-CM

## 2020-01-17 DIAGNOSIS — M8589 Other specified disorders of bone density and structure, multiple sites: Secondary | ICD-10-CM

## 2020-01-17 NOTE — Telephone Encounter (Signed)
-----   Message from Cloyd Stagers, RT sent at 01/08/2020  3:04 PM EST ----- Regarding: Lab Orders for Friday 1.22.2021 Please place lab orders for Friday 1.22.2021, office visit for physical on Friday 1.29.2021 Thank you, Dyke Maes RT(R)

## 2020-01-18 ENCOUNTER — Other Ambulatory Visit (INDEPENDENT_AMBULATORY_CARE_PROVIDER_SITE_OTHER): Payer: Medicare PPO

## 2020-01-18 ENCOUNTER — Other Ambulatory Visit: Payer: Self-pay

## 2020-01-18 DIAGNOSIS — E119 Type 2 diabetes mellitus without complications: Secondary | ICD-10-CM | POA: Diagnosis not present

## 2020-01-18 DIAGNOSIS — E782 Mixed hyperlipidemia: Secondary | ICD-10-CM

## 2020-01-18 DIAGNOSIS — M8589 Other specified disorders of bone density and structure, multiple sites: Secondary | ICD-10-CM

## 2020-01-18 LAB — COMPREHENSIVE METABOLIC PANEL
ALT: 14 U/L (ref 0–35)
AST: 16 U/L (ref 0–37)
Albumin: 4.1 g/dL (ref 3.5–5.2)
Alkaline Phosphatase: 75 U/L (ref 39–117)
BUN: 15 mg/dL (ref 6–23)
CO2: 30 mEq/L (ref 19–32)
Calcium: 9.4 mg/dL (ref 8.4–10.5)
Chloride: 105 mEq/L (ref 96–112)
Creatinine, Ser: 0.79 mg/dL (ref 0.40–1.20)
GFR: 71.86 mL/min (ref >60.00)
Glucose, Bld: 107 mg/dL — ABNORMAL HIGH (ref 70–99)
Potassium: 4 mEq/L (ref 3.5–5.1)
Sodium: 143 mEq/L (ref 135–145)
Total Bilirubin: 0.5 mg/dL (ref 0.2–1.2)
Total Protein: 6.5 g/dL (ref 6.0–8.3)

## 2020-01-18 LAB — LIPID PANEL
Cholesterol: 155 mg/dL (ref 0–200)
HDL: 58.7 mg/dL (ref >39.00)
LDL Cholesterol: 82 mg/dL (ref 0–99)
NonHDL: 96.68
Total CHOL/HDL Ratio: 3
Triglycerides: 74 mg/dL (ref 0.0–149.0)
VLDL: 14.8 mg/dL (ref 0.0–40.0)

## 2020-01-18 LAB — HEMOGLOBIN A1C: Hgb A1c MFr Bld: 6.7 % — ABNORMAL HIGH (ref 4.6–6.5)

## 2020-01-18 LAB — VITAMIN D 25 HYDROXY (VIT D DEFICIENCY, FRACTURES): VITD: 38.52 ng/mL (ref 30.00–100.00)

## 2020-01-18 NOTE — Progress Notes (Signed)
No critical labs need to be addressed urgently. We will discuss labs in detail at upcoming office visit.   

## 2020-01-25 ENCOUNTER — Ambulatory Visit: Payer: Medicare Other | Admitting: Family Medicine

## 2020-01-25 ENCOUNTER — Other Ambulatory Visit: Payer: Self-pay

## 2020-01-25 ENCOUNTER — Ambulatory Visit: Payer: Medicare PPO

## 2020-01-25 ENCOUNTER — Encounter: Payer: Self-pay | Admitting: Family Medicine

## 2020-01-25 VITALS — BP 120/60 | HR 60 | Temp 98.2°F | Ht 64.5 in | Wt 159.8 lb

## 2020-01-25 DIAGNOSIS — I10 Essential (primary) hypertension: Secondary | ICD-10-CM | POA: Diagnosis not present

## 2020-01-25 DIAGNOSIS — R21 Rash and other nonspecific skin eruption: Secondary | ICD-10-CM | POA: Insufficient documentation

## 2020-01-25 DIAGNOSIS — E119 Type 2 diabetes mellitus without complications: Secondary | ICD-10-CM | POA: Diagnosis not present

## 2020-01-25 DIAGNOSIS — E782 Mixed hyperlipidemia: Secondary | ICD-10-CM | POA: Diagnosis not present

## 2020-01-25 MED ORDER — HYDROCHLOROTHIAZIDE 25 MG PO TABS: 25.0000 mg | ORAL_TABLET | Freq: Every day | ORAL | 11 refills | Status: DC

## 2020-01-25 NOTE — Assessment & Plan Note (Signed)
Well controlled but ACEI may be causing rash per DERM. Stop benazapril and change to HCTZ alone.

## 2020-01-25 NOTE — Assessment & Plan Note (Signed)
At goal on STATIN.

## 2020-01-25 NOTE — Assessment & Plan Note (Signed)
Rash resolved on methotrexate . Will try trial off ACEI as may be triggering hypersensitivity reaction per Derm. She may be able to wean off methotrexate then.

## 2020-01-25 NOTE — Assessment & Plan Note (Signed)
Good control with diet. Encouraged exercise, weight loss, healthy eating habits.

## 2020-01-25 NOTE — Patient Instructions (Addendum)
Stop benazapril HCTZ.  Start HCTZ alone.  Follow BP  At home.Marland Kitchen goal  < 140/90.  Get back on track with health eating and regular exercise.

## 2020-01-25 NOTE — Progress Notes (Signed)
Chief Complaint  Patient presents with  . Follow-up     History of Present Illness: HPI  69 year old female presents for follow up DM.  Diabetes:   At goal with diet Lab Results  Component Value Date   HGBA1C 6.7 (H) 01/18/2020  Using medications without difficulties: Hypoglycemic episodes: Hyperglycemic episodes: Feet problems: no ulcers Blood Sugars averaging: not checking eye exam within last year: yes  Hypertension:   At goal on benazapril HCTZ   BP Readings from Last 3 Encounters:  01/25/20 120/60  07/24/19 112/64  12/04/18 (!) 152/74  Using medication without problems or lightheadedness:  none Chest pain with exertion: none Edema: Short of breath: Average home BPs: Other issues:  Elevated Cholesterol:  LDL at goal < 100 on crestor Lab Results  Component Value Date   CHOL 155 01/18/2020   HDL 58.70 01/18/2020   LDLCALC 82 01/18/2020   LDLDIRECT 190.1 07/15/2011   TRIG 74.0 01/18/2020   CHOLHDL 3 01/18/2020  Using medications without problems:none Muscle aches:  none Diet compliance: moderate Exercise: stairs in house. 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  This visit occurred during the SARS-CoV-2 public health emergency.  Safety protocols were in place, including screening questions prior to the visit, additional usage of staff PPE, and extensive cleaning of exam room while observing appropriate contact time as indicated for disinfecting solutions.   COVID 19 screen:  No recent travel or known exposure to COVID19 The patient  denies respiratory symptoms of COVID 19 at this time. The importance of social distancing was discussed today.     Review of Systems  Constitutional: Negative for chills and fever.  HENT: Negative for congestion and ear pain.   Eyes: Negative for pain and redness.  Respiratory: Negative for cough and shortness of breath.   Cardiovascular: Negative for chest pain, palpitations and leg swelling.  Gastrointestinal: Negative for abdominal pain, blood in stool, constipation, diarrhea, nausea and vomiting.  Genitourinary: Negative for dysuria.  Musculoskeletal: Negative for falls and myalgias.  Skin: Negative for rash.  Neurological: Negative for dizziness.  Psychiatric/Behavioral: Negative for depression. The patient is not nervous/anxious.       Past Medical History:  Diagnosis Date  . Hyperlipidemia   . Hypertension     reports that she has never smoked. She has never used smokeless tobacco. She reports that she does not drink alcohol or use drugs.   Current Outpatient Medications:  .  b complex vitamins tablet, Take 1 tablet by mouth daily.  , Disp: , Rfl:  .  benazepril-hydrochlorthiazide (LOTENSIN HCT) 20-12.5 MG tablet, TAKE 1/2 TABLET BY MOUTH DAILY, Disp: 45 tablet, Rfl: 1 .  Blood Glucose Monitoring Suppl (ONE TOUCH ULTRA MINI) w/Device KIT, USE TO CHECK BLOOD SUGAR DAILY, Disp: 1 each, Rfl: 0 .  glucose blood (ONETOUCH ULTRA) test strip, USE TO CHECK BLOOD SUGAR DAILY, Disp: 100 each, Rfl: 3 .  Lancets (ONETOUCH ULTRASOFT) lancets, USE TO CHECK BLOOD SUGAR DAILY, Disp: 100 each, Rfl: 3 .  Nutritional Supplements (JUICE PLUS FIBRE PO), Take by mouth daily., Disp: , Rfl:  .  rosuvastatin (CRESTOR) 5  MG tablet, Take 1 tablet (5 mg total) by mouth every other day., Disp: 45 tablet, Rfl: 1 .  triamcinolone cream (KENALOG) 0.5 %, APPLY TO AFFECTED AREA TWICE A DAY, Disp: 30 g, Rfl: 0 .  venlafaxine XR (EFFEXOR-XR) 37.5 MG 24 hr capsule, TAKE 1 CAPSULE BY MOUTH EVERY DAY, Disp: 90  capsule, Rfl: 0 .  Vitamin D, Cholecalciferol, 10 MCG (400 UNIT) CHEW, Chew 1 tablet by mouth daily., Disp: , Rfl:    Observations/Objective: Blood pressure 120/60, pulse 60, temperature 98.2 F (36.8 C), temperature source Temporal, height 5' 4.5" (1.638 m), weight 159 lb 12 oz (72.5 kg), SpO2 98 %.  Physical Exam Constitutional:      General: She is not in acute distress.    Appearance: Normal appearance. She is well-developed. She is not ill-appearing or toxic-appearing.  HENT:     Head: Normocephalic.     Right Ear: Hearing, tympanic membrane, ear canal and external ear normal.     Left Ear: Hearing, tympanic membrane, ear canal and external ear normal.     Nose: Nose normal.  Eyes:     General: Lids are normal. Lids are everted, no foreign bodies appreciated.     Conjunctiva/sclera: Conjunctivae normal.     Pupils: Pupils are equal, round, and reactive to light.  Neck:     Thyroid: No thyroid mass or thyromegaly.     Vascular: No carotid bruit.     Trachea: Trachea normal.  Cardiovascular:     Rate and Rhythm: Normal rate and regular rhythm.     Heart sounds: Normal heart sounds, S1 normal and S2 normal. No murmur. No gallop.   Pulmonary:     Effort: Pulmonary effort is normal. No respiratory distress.     Breath sounds: Normal breath sounds. No wheezing, rhonchi or rales.  Abdominal:     General: Bowel sounds are normal. There is no distension or abdominal bruit.     Palpations: Abdomen is soft. There is no fluid wave or mass.     Tenderness: There is no abdominal tenderness. There is no guarding or rebound.     Hernia: No hernia is present.  Musculoskeletal:     Cervical back: Normal range of motion and neck supple.  Lymphadenopathy:     Cervical: No cervical adenopathy.  Skin:    General: Skin is warm and dry.     Findings: No rash.  Neurological:     Mental Status: She is alert.     Cranial Nerves: No cranial nerve deficit.     Sensory: No sensory deficit.   Psychiatric:        Mood and Affect: Mood is not anxious or depressed.        Speech: Speech normal.        Behavior: Behavior normal. Behavior is cooperative.        Judgment: Judgment normal.    Diabetic foot exam: Normal inspection No skin breakdown No calluses  Normal DP pulses Normal sensation to light touch and monofilament Nails normal   Assessment and Plan Diabetes mellitus with no complication (HCC)  Good control with diet. Encouraged exercise, weight loss, healthy eating habits.   Essential hypertension, benign Well controlled but ACEI may be causing rash per DERM. Stop benazapril and change to HCTZ alone.  Hyperlipidemia  At goal on STATIN.  Rash Rash resolved on methotrexate . Will try trial off ACEI as may be triggering hypersensitivity reaction per Derm. She may be able to wean off methotrexate  then.       Eliezer Lofts, MD

## 2020-02-02 ENCOUNTER — Ambulatory Visit: Payer: Medicare PPO

## 2020-02-07 DIAGNOSIS — H35051 Retinal neovascularization, unspecified, right eye: Secondary | ICD-10-CM | POA: Diagnosis not present

## 2020-02-11 ENCOUNTER — Telehealth: Payer: Self-pay

## 2020-02-11 DIAGNOSIS — R21 Rash and other nonspecific skin eruption: Secondary | ICD-10-CM | POA: Diagnosis not present

## 2020-02-11 DIAGNOSIS — J302 Other seasonal allergic rhinitis: Secondary | ICD-10-CM | POA: Diagnosis not present

## 2020-02-11 DIAGNOSIS — L810 Postinflammatory hyperpigmentation: Secondary | ICD-10-CM | POA: Diagnosis not present

## 2020-02-11 DIAGNOSIS — Z79899 Other long term (current) drug therapy: Secondary | ICD-10-CM | POA: Diagnosis not present

## 2020-02-11 DIAGNOSIS — L820 Inflamed seborrheic keratosis: Secondary | ICD-10-CM | POA: Diagnosis not present

## 2020-02-11 NOTE — Telephone Encounter (Addendum)
Lab results faxed to Dr. Osborn Coho at [phone].

## 2020-02-11 NOTE — Telephone Encounter (Signed)
Pt saw Dr Osborn Coho with [facility] [place].  Also request latest lab faxed to [phone]; Dr Osborn Coho and Dr Jarvis Morgan # is [phone] if needed. Pt said her face is dry and having itchy eyes but no drainage from eyes and no redness. Pt noticed the itchy eyes the day after changed to HCTZ 25 mg. Pt wonders if changing med could cause itching. Pt said she picked up some drops to put in eyes for itchy eye and does not want med sent to pharmacy. Pt just wondering if changing med could have anything to do with itchy eyes.

## 2020-02-12 NOTE — Telephone Encounter (Signed)
Takyra notified as instructed by telephone.  Patient states understanding.

## 2020-02-12 NOTE — Telephone Encounter (Signed)
Itchy eyes not usually SE, but possibly could be if she has an allergy.  She cpould try holding the HCTZ for 1 week to see if symptom clears up. Follow BPs as well of the med.

## 2020-02-15 ENCOUNTER — Ambulatory Visit: Payer: Medicare PPO

## 2020-02-19 ENCOUNTER — Other Ambulatory Visit: Payer: Self-pay | Admitting: Family Medicine

## 2020-02-19 MED ORDER — AMLODIPINE BESYLATE 5 MG PO TABS: 5.0000 mg | ORAL_TABLET | Freq: Every day | ORAL | 11 refills | Status: DC

## 2020-02-19 NOTE — Telephone Encounter (Signed)
Pt called with update on itchy eyes and BP readings since stopping HCTZ for 1 wk. Pt said has not taken HCTZ and the itchy eyes are gone. Pt is concerned about BP. 02/12/20 at 9:30 PM   BP 155/72 P 60 02/13/20 at 7:35 AM   BP  139/70 P 70 02/13/20 at 10:15 PM  BP  152/74 P 62 02/14/20 at  10:15 AM  BP 139/71 P 55 02/15/20 at 5:30 AM     BP 136/72 P 56 02/16/20 at 7:45 AM    BP 135/72 P 55 02/19/20 at 2:40 PM  BP 152/75 P 62  No H/A,dizziness, CP or SOB.  Pt used to take Benazepril and HCTZ combo before switching to plain HCTZ. Pt [facility] cb. [facility]

## 2020-02-19 NOTE — Telephone Encounter (Signed)
BP is above goal off both benazepril  ( derm thought causing rash) and HCTZ ( pt feels causing eye issue). Start amlodipine 5 mg daily. Follow BP at home.Marland Kitchen goal < 140/90.  Call with measurements in 2 weeks. I sent  in rx #30, 11 RF.

## 2020-02-20 NOTE — Telephone Encounter (Signed)
Tanicka notified as instructed by telephone.  Patient states understanding.

## 2020-02-21 ENCOUNTER — Ambulatory Visit: Payer: Medicare Other

## 2020-02-21 DIAGNOSIS — Z79899 Other long term (current) drug therapy: Secondary | ICD-10-CM | POA: Diagnosis not present

## 2020-03-20 ENCOUNTER — Telehealth: Payer: Self-pay

## 2020-03-20 NOTE — Telephone Encounter (Signed)
Sent Mychart message to pt.

## 2020-03-20 NOTE — Telephone Encounter (Signed)
Patient called back with her b/p readings for Dr Diona Browner:  2/25- 12:30 pm- 134/67 P 66 2/26- 9:40 am- 132/72 P 60 2/28/- 6:10 am- 131/71 P 66 3/2 - 9: 10 pm- 145/72  P 69 3/13- 8 am- 122/77 P 58 3/20- 7:30am- 129/74 P 60 3/24- 9:30 pm- 150/73 P 55.

## 2020-04-09 DIAGNOSIS — H35051 Retinal neovascularization, unspecified, right eye: Secondary | ICD-10-CM | POA: Diagnosis not present

## 2020-04-10 ENCOUNTER — Telehealth: Payer: Self-pay

## 2020-04-10 NOTE — Telephone Encounter (Signed)
Noted  

## 2020-04-10 NOTE — Telephone Encounter (Signed)
Pt said she cannot remember when she stopped taking venlafaxine. Pt said has had some traumatic things in pts family; pts sister has breast cancer and is scheduled for mastectomy next wk and pts granddaughter and 3 great grandchildren are living in homeless shelter due to [employer] mate. Pt is waking up at 2 AM thinking and worrying and is starting to feel down and overwhelmed. No SI/HI. Pt has no covid symptoms, no travel and no known exposure to + covid. Pt scheduled in office appt on 04/11/20 at 8:20, pt will be at [facility] to ck in at 8:05. UC & ED precautions given and pt voiced understanding. FYI to Dr Diona Browner.

## 2020-04-11 ENCOUNTER — Telehealth: Payer: Self-pay | Admitting: Family Medicine

## 2020-04-11 ENCOUNTER — Other Ambulatory Visit: Payer: Self-pay

## 2020-04-11 ENCOUNTER — Ambulatory Visit: Payer: Medicare PPO | Admitting: Family Medicine

## 2020-04-11 ENCOUNTER — Encounter: Payer: Self-pay | Admitting: Family Medicine

## 2020-04-11 VITALS — BP 120/70 | HR 56 | Temp 98.1°F | Ht 64.5 in | Wt 158.2 lb

## 2020-04-11 DIAGNOSIS — F411 Generalized anxiety disorder: Secondary | ICD-10-CM | POA: Diagnosis not present

## 2020-04-11 DIAGNOSIS — F331 Major depressive disorder, recurrent, moderate: Secondary | ICD-10-CM | POA: Diagnosis not present

## 2020-04-11 MED ORDER — VENLAFAXINE HCL ER 75 MG PO CP24: ORAL_CAPSULE | ORAL | 2 refills | Status: DC

## 2020-04-11 MED ORDER — VENLAFAXINE HCL ER 37.5 MG PO CP24: ORAL_CAPSULE | ORAL | 0 refills | Status: DC

## 2020-04-11 NOTE — Assessment & Plan Note (Signed)
Inadequate control.. restart venlafaxine.Marland Kitchen titrate to 75 mg. Follow up in 4 weeks.  not currently interested in counseling.

## 2020-04-11 NOTE — Telephone Encounter (Signed)
Koey notified as instructed by telephone.  Patient states understanding.

## 2020-04-11 NOTE — Patient Instructions (Signed)
Restart venlafaxine .. increase to 2 capsules after 2 week... call if any side effects.

## 2020-04-11 NOTE — Telephone Encounter (Signed)
New rx sent... second one is just the higher dose ( does not need to take 2 per day).

## 2020-04-11 NOTE — Telephone Encounter (Signed)
Meghan Kerr with [facility] called today about venlafaxine XR (EFFEXOR-XR) 37.5 MG 24 hr capsule   Meghan Kerr stated that patient insurance will not cover the prescription sent He stated that they will need 2 separate scripts  One for the 7 day and then another script for the 2 a day.   Please advise

## 2020-04-11 NOTE — Assessment & Plan Note (Signed)
Increase exercise, work on stress reduction, relaxation techniques.

## 2020-04-11 NOTE — Progress Notes (Signed)
Chief Complaint  Patient presents with  . Feeling Over Whelmed    History of Present Illness: HPI  69 year old female with history of MDD presents with worsening mood symptoms in last 6 months.  Stressors: mulitple deaths in family, sister with breast cancer She is back at work 2 days a week.  Daughter is homeless.  She reports excessive worrying, poor sleep ( cannot fall asleep and stay asleep) Feeling nervous and on edge.   She is no longer taking low dose venlafaxine 37.5 mg daily.  No SE in past.   This visit occurred during the SARS-CoV-2 public health emergency.  Safety protocols were in place, including screening questions prior to the visit, additional usage of staff PPE, and extensive cleaning of exam room while observing appropriate contact time as indicated for disinfecting solutions.   COVID 19 screen:  No recent travel or known exposure to COVID19 The patient denies respiratory symptoms of COVID 19 at this time. The importance of social distancing was discussed today.     Review of Systems  Constitutional: Negative for chills and fever.  HENT: Negative for congestion and ear pain.   Eyes: Negative for pain and redness.  Respiratory: Negative for cough and shortness of breath.   Cardiovascular: Negative for chest pain, palpitations and leg swelling.  Gastrointestinal: Negative for abdominal pain, blood in stool, constipation, diarrhea, nausea and vomiting.  Genitourinary: Negative for dysuria.  Musculoskeletal: Negative for falls and myalgias.  Skin: Negative for rash.  Neurological: Negative for dizziness.  Psychiatric/Behavioral: Negative for depression. The patient is not nervous/anxious.       Past Medical History:  Diagnosis Date  . Hyperlipidemia   . Hypertension     reports that she has never smoked. She has never used smokeless tobacco. She reports that she does not drink alcohol or use drugs.   Current Outpatient Medications:  .  amLODipine  (NORVASC) 5 MG tablet, Take 1 tablet (5 mg total) by mouth daily., Disp: 30 tablet, Rfl: 11 .  Apoaequorin (PREVAGEN PO), Take 1 tablet by mouth daily., Disp: , Rfl:  .  b complex vitamins tablet, Take 1 tablet by mouth daily.  , Disp: , Rfl:  .  Blood Glucose Monitoring Suppl (ONE TOUCH ULTRA MINI) w/Device KIT, USE TO CHECK BLOOD SUGAR DAILY, Disp: 1 each, Rfl: 0 .  folic acid (FOLVITE) 1 MG tablet, Take 1 mg by mouth daily. , Disp: , Rfl:  .  glucose blood (ONETOUCH ULTRA) test strip, USE TO CHECK BLOOD SUGAR DAILY, Disp: 100 each, Rfl: 3 .  Lancets (ONETOUCH ULTRASOFT) lancets, USE TO CHECK BLOOD SUGAR DAILY, Disp: 100 each, Rfl: 3 .  methotrexate (RHEUMATREX) 2.5 MG tablet, Take 15 mg by mouth. , Disp: , Rfl:  .  Nutritional Supplements (JUICE PLUS FIBRE PO), Take by mouth daily., Disp: , Rfl:  .  rosuvastatin (CRESTOR) 5 MG tablet, Take 1 tablet (5 mg total) by mouth every other day., Disp: 45 tablet, Rfl: 1 .  triamcinolone cream (KENALOG) 0.5 %, APPLY TO AFFECTED AREA TWICE A DAY, Disp: 30 g, Rfl: 0 .  venlafaxine XR (EFFEXOR-XR) 37.5 MG 24 hr capsule, TAKE 1 CAPSULE BY MOUTH EVERY DAY, Disp: 90 capsule, Rfl: 0 .  Vitamin D, Cholecalciferol, 10 MCG (400 UNIT) CHEW, Chew 1 tablet by mouth daily., Disp: , Rfl:    Observations/Objective: Blood pressure 120/70, pulse (!) 56, temperature 98.1 F (36.7 C), temperature source Temporal, height 5' 4.5" (1.638 m), weight 158 lb 4 oz (  71.8 kg), SpO2 98 %.  Physical Exam Constitutional:      General: She is not in acute distress.    Appearance: Normal appearance. She is well-developed. She is not ill-appearing or toxic-appearing.  HENT:     Head: Normocephalic.     Right Ear: Hearing, tympanic membrane, ear canal and external ear normal. Tympanic membrane is not erythematous, retracted or bulging.     Left Ear: Hearing, tympanic membrane, ear canal and external ear normal. Tympanic membrane is not erythematous, retracted or bulging.     Nose:  No mucosal edema or rhinorrhea.     Right Sinus: No maxillary sinus tenderness or frontal sinus tenderness.     Left Sinus: No maxillary sinus tenderness or frontal sinus tenderness.     Mouth/Throat:     Pharynx: Uvula midline.  Eyes:     General: Lids are normal. Lids are everted, no foreign bodies appreciated.     Conjunctiva/sclera: Conjunctivae normal.     Pupils: Pupils are equal, round, and reactive to light.  Neck:     Thyroid: No thyroid mass or thyromegaly.     Vascular: No carotid bruit.     Trachea: Trachea normal.  Cardiovascular:     Rate and Rhythm: Normal rate and regular rhythm.     Pulses: Normal pulses.     Heart sounds: Normal heart sounds, S1 normal and S2 normal. No murmur. No friction rub. No gallop.   Pulmonary:     Effort: Pulmonary effort is normal. No tachypnea or respiratory distress.     Breath sounds: Normal breath sounds. No decreased breath sounds, wheezing, rhonchi or rales.  Abdominal:     General: Bowel sounds are normal.     Palpations: Abdomen is soft.     Tenderness: There is no abdominal tenderness.  Musculoskeletal:     Cervical back: Normal range of motion and neck supple.  Skin:    General: Skin is warm and dry.     Findings: No rash.  Neurological:     Mental Status: She is alert.  Psychiatric:        Mood and Affect: Mood is not anxious or depressed.        Speech: Speech normal.        Behavior: Behavior normal. Behavior is cooperative.        Thought Content: Thought content normal.        Judgment: Judgment normal.      Assessment and Plan   Major depressive disorder, recurrent episode, moderate (HCC) Inadequate control.. restart venlafaxine.Marland Kitchen titrate to 75 mg. Follow up in 4 weeks.  not currently interested in counseling.  GAD (generalized anxiety disorder) Increase exercise, work on stress reduction, relaxation techniques.     Eliezer Lofts, MD

## 2020-05-03 ENCOUNTER — Other Ambulatory Visit: Payer: Self-pay | Admitting: Family Medicine

## 2020-05-05 NOTE — Telephone Encounter (Signed)
Request from Pharmacy for 90 day supply instead of 30. Ok to refill?

## 2020-05-07 ENCOUNTER — Other Ambulatory Visit: Payer: Self-pay | Admitting: Family Medicine

## 2020-05-07 DIAGNOSIS — Z1231 Encounter for screening mammogram for malignant neoplasm of breast: Secondary | ICD-10-CM

## 2020-05-09 ENCOUNTER — Encounter: Payer: Self-pay | Admitting: Family Medicine

## 2020-05-09 ENCOUNTER — Other Ambulatory Visit: Payer: Self-pay

## 2020-05-09 ENCOUNTER — Ambulatory Visit: Payer: Medicare PPO | Admitting: Family Medicine

## 2020-05-09 VITALS — BP 124/64 | HR 58 | Temp 98.5°F | Ht 64.5 in | Wt 157.8 lb

## 2020-05-09 DIAGNOSIS — F331 Major depressive disorder, recurrent, moderate: Secondary | ICD-10-CM

## 2020-05-09 DIAGNOSIS — F411 Generalized anxiety disorder: Secondary | ICD-10-CM | POA: Diagnosis not present

## 2020-05-09 NOTE — Assessment & Plan Note (Signed)
Improved control on venlafaxine. Encouraged healthy sleep hygiene and regular exercise.

## 2020-05-09 NOTE — Progress Notes (Signed)
Chief Complaint  Patient presents with  . Follow-up    Mood    History of Present Illness: HPI    69 year old female presents for 4 week follow up MDD and GAD.   At last OV, she was restarted on venlafaxine 37.5 mg daily and instructed to increase to 75 mg if tolerated.   She reports today  She has noted  60% improvement in her mood overall.  Her sister is doing better overall.  She has no SE  to venlafaxine.  She always has issue.   She is working stress. Occ using melatponin at bedtime for sleep.   PHQ9 has improved 50 5  GAD7 5     This visit occurred during the SARS-CoV-2 public health emergency.  Safety protocols were in place, including screening questions prior to the visit, additional usage of staff PPE, and extensive cleaning of exam room while observing appropriate contact time as indicated for disinfecting solutions.   COVID 19 screen:  No recent travel or known exposure to COVID19 The patient denies respiratory symptoms of COVID 19 at this time. The importance of social distancing was discussed today.     ROS    Past Medical History:  Diagnosis Date  . Hyperlipidemia   . Hypertension     reports that she has never smoked. She has never used smokeless tobacco. She reports that she does not drink alcohol or use drugs.   Current Outpatient Medications:  .  amLODipine (NORVASC) 5 MG tablet, Take 1 tablet (5 mg total) by mouth daily., Disp: 30 tablet, Rfl: 11 .  Apoaequorin (PREVAGEN PO), Take 1 tablet by mouth daily., Disp: , Rfl:  .  b complex vitamins tablet, Take 1 tablet by mouth daily.  , Disp: , Rfl:  .  Blood Glucose Monitoring Suppl (ONE TOUCH ULTRA MINI) w/Device KIT, USE TO CHECK BLOOD SUGAR DAILY, Disp: 1 each, Rfl: 0 .  folic acid (FOLVITE) 1 MG tablet, Take 1 mg by mouth daily. , Disp: , Rfl:  .  glucose blood (ONETOUCH ULTRA) test strip, USE TO CHECK BLOOD SUGAR DAILY, Disp: 100 each, Rfl: 3 .  Lancets (ONETOUCH ULTRASOFT) lancets, USE TO  CHECK BLOOD SUGAR DAILY, Disp: 100 each, Rfl: 3 .  methotrexate (RHEUMATREX) 2.5 MG tablet, Take 15 mg by mouth. , Disp: , Rfl:  .  Nutritional Supplements (JUICE PLUS FIBRE PO), Take by mouth daily., Disp: , Rfl:  .  rosuvastatin (CRESTOR) 5 MG tablet, Take 1 tablet (5 mg total) by mouth every other day., Disp: 45 tablet, Rfl: 1 .  triamcinolone cream (KENALOG) 0.5 %, APPLY TO AFFECTED AREA TWICE A DAY, Disp: 30 g, Rfl: 0 .  venlafaxine XR (EFFEXOR-XR) 75 MG 24 hr capsule, TAKE 1 CAPSULE BY MOUTH EVERY DAY, Disp: 90 capsule, Rfl: 1 .  Vitamin D, Cholecalciferol, 10 MCG (400 UNIT) CHEW, Chew 1 tablet by mouth daily., Disp: , Rfl:    Observations/Objective: Blood pressure 124/64, pulse (!) 58, temperature 98.5 F (36.9 C), temperature source Temporal, height 5' 4.5" (1.638 m), weight 157 lb 12 oz (71.6 kg), SpO2 98 %.  Physical Exam Constitutional:      General: She is not in acute distress.    Appearance: Normal appearance. She is well-developed. She is not ill-appearing or toxic-appearing.  HENT:     Head: Normocephalic.     Right Ear: Hearing, tympanic membrane, ear canal and external ear normal. Tympanic membrane is not erythematous, retracted or bulging.  Left Ear: Hearing, tympanic membrane, ear canal and external ear normal. Tympanic membrane is not erythematous, retracted or bulging.     Nose: No mucosal edema or rhinorrhea.     Right Sinus: No maxillary sinus tenderness or frontal sinus tenderness.     Left Sinus: No maxillary sinus tenderness or frontal sinus tenderness.     Mouth/Throat:     Pharynx: Uvula midline.  Eyes:     General: Lids are normal. Lids are everted, no foreign bodies appreciated.     Conjunctiva/sclera: Conjunctivae normal.     Pupils: Pupils are equal, round, and reactive to light.  Neck:     Thyroid: No thyroid mass or thyromegaly.     Vascular: No carotid bruit.     Trachea: Trachea normal.  Cardiovascular:     Rate and Rhythm: Normal rate and  regular rhythm.     Pulses: Normal pulses.     Heart sounds: Normal heart sounds, S1 normal and S2 normal. No murmur. No friction rub. No gallop.   Pulmonary:     Effort: Pulmonary effort is normal. No tachypnea or respiratory distress.     Breath sounds: Normal breath sounds. No decreased breath sounds, wheezing, rhonchi or rales.  Abdominal:     General: Bowel sounds are normal.     Palpations: Abdomen is soft.     Tenderness: There is no abdominal tenderness.  Musculoskeletal:     Cervical back: Normal range of motion and neck supple.  Skin:    General: Skin is warm and dry.     Findings: No rash.  Neurological:     Mental Status: She is alert.  Psychiatric:        Attention and Perception: Attention normal.        Mood and Affect: Mood normal. Mood is not anxious or depressed.        Speech: Speech normal.        Behavior: Behavior normal. Behavior is cooperative.        Thought Content: Thought content normal.        Judgment: Judgment normal.      Assessment and Plan   Major depressive disorder, recurrent episode, moderate (HCC) Improved control on venlafaxine. Encouraged healthy sleep hygiene and regular exercise.  GAD (generalized anxiety disorder) Improved control on venlafaxine.    Eliezer Lofts, MD

## 2020-05-09 NOTE — Assessment & Plan Note (Signed)
Improved control on venlafaxine.

## 2020-05-09 NOTE — Patient Instructions (Signed)
Continue venlafaxine 75 mg daily.  Work on [employer].

## 2020-05-22 DIAGNOSIS — H35051 Retinal neovascularization, unspecified, right eye: Secondary | ICD-10-CM | POA: Diagnosis not present

## 2020-05-23 ENCOUNTER — Other Ambulatory Visit: Payer: Self-pay | Admitting: Family Medicine

## 2020-05-23 NOTE — Telephone Encounter (Signed)
Last office visit 05/09/2020 for MDD/GAD.  Last refilled 10/16/2019 for 30 g with no refills.  CPE scheduled for 07/25/2020.

## 2020-05-30 ENCOUNTER — Ambulatory Visit
Admission: RE | Admit: 2020-05-30 | Discharge: 2020-05-30 | Disposition: A | Discharge status: Home / Self Care | Payer: Medicare PPO | Source: Ambulatory Visit | Attending: Family Medicine | Admitting: Family Medicine

## 2020-05-30 DIAGNOSIS — Z1231 Encounter for screening mammogram for malignant neoplasm of breast: Secondary | ICD-10-CM | POA: Insufficient documentation

## 2020-06-16 DIAGNOSIS — L239 Allergic contact dermatitis, unspecified cause: Secondary | ICD-10-CM | POA: Diagnosis not present

## 2020-06-16 DIAGNOSIS — R21 Rash and other nonspecific skin eruption: Secondary | ICD-10-CM | POA: Diagnosis not present

## 2020-06-16 DIAGNOSIS — Z79899 Other long term (current) drug therapy: Secondary | ICD-10-CM | POA: Diagnosis not present

## 2020-06-17 DIAGNOSIS — Z79899 Other long term (current) drug therapy: Secondary | ICD-10-CM | POA: Diagnosis not present

## 2020-07-14 LAB — HM DIABETES FOOT EXAM

## 2020-07-16 DIAGNOSIS — H35051 Retinal neovascularization, unspecified, right eye: Secondary | ICD-10-CM | POA: Diagnosis not present

## 2020-07-16 DIAGNOSIS — E119 Type 2 diabetes mellitus without complications: Secondary | ICD-10-CM | POA: Diagnosis not present

## 2020-07-16 LAB — HM DIABETES EYE EXAM

## 2020-07-17 ENCOUNTER — Ambulatory Visit (INDEPENDENT_AMBULATORY_CARE_PROVIDER_SITE_OTHER): Payer: Medicare PPO

## 2020-07-17 ENCOUNTER — Other Ambulatory Visit: Payer: Self-pay

## 2020-07-17 DIAGNOSIS — Z0000 Encounter for general adult medical examination without abnormal findings: Secondary | ICD-10-CM

## 2020-07-17 NOTE — Progress Notes (Signed)
Subjective:   Meghan Kerr is a 69 y.o. female who presents for Medicare Annual (Subsequent) preventive examination.  Review of Systems: N/A      I connected with the patient today by telephone and verified that I am speaking with the correct person using two identifiers. Location patient: home Location nurse: work Persons participating in the virtual visit: patient, Marine scientist.   I discussed the limitations, risks, security and privacy concerns of performing an evaluation and management service by telephone and the availability of in person appointments. I also discussed with the patient that there may be a patient responsible charge related to this service. The patient expressed understanding and verbally consented to this telephonic visit.    Interactive audio and video telecommunications were attempted between this nurse and patient, however failed, due to patient having technical difficulties OR patient did not have access to video capability.  We continued and completed visit with audio only.     Cardiac Risk Factors include: advanced age (>32mn, >>58women);diabetes mellitus;hypertension;dyslipidemia     Objective:    Today's Vitals   There is no height or weight on file to calculate BMI.  Advanced Directives 07/17/2020 06/20/2019 06/08/2018 12/03/2016  Does Patient Have a Medical Advance Directive? No No No Yes  Type of Advance Directive - (No Data) - [place]  Does patient want to make changes to medical advance directive? - No - Patient declined - -  Copy of [place]? - - - No - copy requested  Would patient like information on creating a medical advance directive? Yes (MAU/Ambulatory/Procedural Areas - Information given) No - Patient declined Yes (MAU/Ambulatory/Procedural Areas - Information given) -    Current Medications (verified) Outpatient Encounter Medications as of 07/17/2020  Medication Sig  .  amLODipine (NORVASC) 5 MG tablet Take 1 tablet (5 mg total) by mouth daily.  .Marland KitchenApoaequorin (PREVAGEN PO) Take 1 tablet by mouth daily.  .Marland Kitchenb complex vitamins tablet Take 1 tablet by mouth daily.    . Blood Glucose Monitoring Suppl (ONE TOUCH ULTRA MINI) w/Device KIT USE TO CHECK BLOOD SUGAR DAILY  . folic acid (FOLVITE) 1 MG tablet Take 1 mg by mouth daily.   .Marland Kitchenglucose blood (ONETOUCH ULTRA) test strip USE TO CHECK BLOOD SUGAR DAILY  . Lancets (ONETOUCH ULTRASOFT) lancets USE TO CHECK BLOOD SUGAR DAILY  . methotrexate (RHEUMATREX) 2.5 MG tablet Take 15 mg by mouth.   . Nutritional Supplements (JUICE PLUS FIBRE PO) Take by mouth daily.  . rosuvastatin (CRESTOR) 5 MG tablet Take 1 tablet (5 mg total) by mouth every other day.  . triamcinolone cream (KENALOG) 0.5 % APPLY TO AFFECTED AREA TWICE A DAY  . venlafaxine XR (EFFEXOR-XR) 75 MG 24 hr capsule TAKE 1 CAPSULE BY MOUTH EVERY DAY  . Vitamin D, Cholecalciferol, 10 MCG (400 UNIT) CHEW Chew 1 tablet by mouth daily.   No facility-administered encounter medications on file as of 07/17/2020.    Allergies (verified) Amoxicillin, Sulfa antibiotics, and Sulfonamide derivatives   History: Past Medical History:  Diagnosis Date  . Hyperlipidemia   . Hypertension    Past Surgical History:  Procedure Laterality Date  . APPENDECTOMY    . [facility] [place] HAMMER TOE SURGERY Left 08/19/2017   hardware removed April 2019  . TRIGGER FINGER RELEASE  2006  . WRIST FRACTURE SURGERY     broken left wrist repair with plate   Family History  Problem Relation Age of Onset  . Diabetes Mother   . Heart disease Mother        cad and chf  . Heart disease Father        heart attack (massive)  . Cancer Maternal Grandmother        COLON  . Stroke Maternal Grandfather   . Diabetes Brother   . Heart disease Brother 3       hert attack  . Heart disease Brother 77       CABG   . Stroke Sister   . Heart disease Sister        VALVE  REPLACEMENT  . Breast cancer Sister   . Breast cancer Cousin 47   Social History   Socioeconomic History  . Marital status: Married    Spouse name: Not on file  . Number of children: Not on file  . Years of education: Not on file  . Highest education level: Not on file  Occupational History  . Occupation: TEACHER    Comment: Kindergarten  Tobacco Use  . Smoking status: Never Smoker  . Smokeless tobacco: Never Used  Vaping Use  . Vaping Use: Never used  Substance and Sexual Activity  . Alcohol use: No  . Drug use: No  . Sexual activity: Not Currently  Other Topics Concern  . Not on file  Social History Narrative  . Not on file   Social Determinants of Health   Financial Resource Strain: Low Risk   . Difficulty of Paying Living Expenses: Not hard at all  Food Insecurity: No Food Insecurity  . Worried About Charity fundraiser in the Last Year: Never true  . Ran Out of Food in the Last Year: Never true  Transportation Needs: No Transportation Needs  . Lack of Transportation (Medical): No  . Lack of Transportation (Non-Medical): No  Physical Activity: Inactive  . Days of Exercise per Week: 0 days  . Minutes of Exercise per Session: 0 min  Stress: No Stress Concern Present  . Feeling of Stress : Not at all  Social Connections:   . Frequency of Communication with Friends and Family:   . Frequency of Social Gatherings with Friends and Family:   . Attends Religious Services:   . Active Member of Clubs or Organizations:   . Attends Archivist Meetings:   Marland Kitchen Marital Status:     Tobacco Counseling Counseling given: Not Answered   Clinical Intake:  Pre-visit preparation completed: Yes  Pain : No/denies pain     Nutritional Risks: None Diabetes: Yes CBG done?: No Did pt. bring in CBG monitor from home?: No  How often do you need to have someone help you when you read instructions, pamphlets, or other written materials from your doctor or pharmacy?: 1  - Never What is the last grade level you completed in school?: bachelors, some master courses  Diabetic: Yes Nutrition Risk Assessment:  Has the patient had any N/V/D within the last 2 months?  No  Does the patient have any non-healing wounds?  No  Has the patient had any unintentional weight loss or weight gain?  No   Diabetes:  Is the patient diabetic?  Yes  If diabetic, was a CBG obtained today?  No  Did the patient bring in their glucometer from home?  No  How often do you monitor your CBG's? 3-4 times a week.   Financial Strains and Diabetes Management:  Are you having any financial strains  with the device, your supplies or your medication? No .  Does the patient want to be seen by Chronic Care Management for management of their diabetes?  No  Would the patient like to be referred to a Nutritionist or for Diabetic Management?  No   Diabetic Exams:  Diabetic Eye Exam: Completed 07/16/2020 Diabetic Foot Exam: Completed 07/24/2019   Interpreter Needed?: No  Information entered by :: CJohnson, LPN   Activities of Daily Living In your present state of health, do you have any difficulty performing the following activities: 07/17/2020  Hearing? N  Vision? N  Difficulty concentrating or making decisions? N  Walking or climbing stairs? N  Dressing or bathing? N  Doing errands, shopping? N  Preparing Food and eating ? N  Using the Toilet? N  In the past six months, have you accidently leaked urine? N  Do you have problems with loss of bowel control? N  Managing your Medications? N  Managing your Finances? N  Housekeeping or managing your Housekeeping? N  Some recent data might be hidden    Patient Care Team: Jinny Sanders, MD as PCP - General Edison Pace Josie Saunders, MD as Consulting Physician (Ophthalmology)  Indicate any recent Medical Services you may have received from other than [facility] providers in the past year (date may be approximate).     Assessment:   This is a  routine wellness examination for Dakisha.  Hearing/Vision screen  Hearing Screening   _0  _1  _2  _3  _4  _5  _6  _7  _8   Right ear:           Left ear:           Vision Screening Comments: Patient gets annual eye exams   Dietary issues and exercise activities discussed: Current Exercise Habits: The patient does not participate in regular exercise at present, Exercise limited by: None identified  Goals    . DIET - INCREASE WATER INTAKE     Starting 06/08/2018, I will attempt to drink at least 6-8 glasses of water daily.     . Patient Stated     07/17/2020, I will maintain and continue medications as prescribed.       Depression Screen PHQ 2/9 Scores 07/17/2020 05/09/2020 04/11/2020 06/20/2019 06/08/2018 06/21/2017 12/03/2016  PHQ - 2 Score _9 0 0 0 0  PHQ- 9 Score _10 0 0 - -    Fall Risk Fall Risk  07/17/2020 06/20/2019 06/08/2018 12/03/2016 12/01/2015  Falls in the past year? 0 0 No Yes No  Number falls in past yr: 0 - - 1 -  Injury with Fall? 0 - - No -  Risk for fall due to : Medication side effect - - - -  Follow up Falls evaluation completed;Falls prevention discussed - - - -    Any stairs in or around the home? Yes  If so, are there any without handrails? No  Home free of loose throw rugs in walkways, pet beds, electrical cords, etc? Yes  Adequate lighting in your home to reduce risk of falls? Yes   ASSISTIVE DEVICES UTILIZED TO PREVENT FALLS:  Life alert? No  Use of a cane, walker or w/c? No  Grab bars in the bathroom? No  Shower chair or bench in shower? No  Elevated toilet seat or a handicapped toilet? No   TIMED UP AND GO:  Was the test performed? N/A, telephonic visit .    Cognitive Function: MMSE - Mini Mental State Exam 07/17/2020 06/20/2019  06/08/2018  Orientation to time _0 Orientation to Place _1 Registration _2 Attention/ Calculation 5 0 0  Recall _3 Language- name 2 objects - 0 0  Language- repeat _4 Language- follow 3 step command - 0 3  Language- read & follow direction - 0 0  Write a sentence - 0 0  Copy design - 0 0  Total score - 17 20  Mini Cog  Mini-Cog screen was completed. Maximum score is 22. A value of 0 denotes this part of the MMSE was not completed or the patient failed this part of the Mini-Cog screening.       Immunizations Immunization History  Administered Date(s) Administered  . Fluad Quad(high Dose 65+) 08/22/2019  . Influenza Whole 10/24/2008, 10/23/2010  . Influenza,inj,Quad PF,6+ Mos 09/13/2014, 01/15/2015, 12/01/2015, 12/03/2016, 10/26/2017, 10/03/2018  . PFIZER SARS-COV-2 Vaccination 02/01/2020, 02/22/2020  . Pneumococcal Conjugate-13 09/13/2014  . Pneumococcal Polysaccharide-23 10/24/2008, 01/15/2015  . Td 12/27/2005    TDAP status: Due, Education has been provided regarding the importance of this vaccine. Advised may receive this vaccine at local pharmacy or Health Dept. Aware to provide a copy of the vaccination record if obtained from local pharmacy or Health Dept. Verbalized acceptance and understanding. Flu Vaccine status: Up to date Pneumococcal vaccine status: Up to date Covid-19 vaccine status: Completed vaccines  Qualifies for Shingles Vaccine? Yes   Zostavax completed No   Shingrix Completed?: No.    Education has been provided regarding the importance of this vaccine. Patient has been advised to call insurance company to determine out of pocket expense if they have not yet received this vaccine. Advised may also receive vaccine at local pharmacy or Health Dept. Verbalized acceptance and understanding.  Screening Tests Health Maintenance  Topic Date Due  . Fecal DNA (Cologuard)  04/13/2020  . HEMOGLOBIN A1C  07/17/2020  . TETANUS/TDAP  12/26/2020 (Originally 12/28/2015)  . FOOT EXAM  07/23/2020  . INFLUENZA VACCINE  07/27/2020  . OPHTHALMOLOGY EXAM  07/16/2021  . MAMMOGRAM  05/30/2022  . DEXA SCAN  09/07/2023  . COVID-19 Vaccine   Completed  . Hepatitis C Screening  Completed  . PNA vac Low Risk Adult  Completed    Health Maintenance  Health Maintenance Due  Topic Date Due  . Fecal DNA (Cologuard)  04/13/2020  . HEMOGLOBIN A1C  07/17/2020    Colorectal cancer screening: Cologuard due, will get kit at office visit  Mammogram status: Completed 05/30/2020. Repeat every year Bone Density status: Completed 09/06/2018. Results reflect: Bone density results: NORMAL. Repeat every 5 years.  Lung Cancer Screening: (Low Dose CT Chest recommended if Age 46-80 years, 30 pack-year currently smoking OR have quit w/in 15years.) does not qualify.    Additional Screening:  Hepatitis C Screening: does qualify; Completed 11/28/2015  Vision Screening: Recommended annual ophthalmology exams for early detection of glaucoma and other disorders of the eye. Is the patient up to date with their annual eye exam?  Yes  Who is the provider or what is the name of the office in which the patient attends annual eye exams? [facility], Dr. Edison Pace If pt is not established with a provider, would they like to be referred to a provider to establish care? No .   Dental Screening: Recommended annual dental exams for proper oral hygiene  Community Resource Referral / Chronic Care Management: CRR required this visit?  No   CCM required  this visit?  No      Plan:     I have personally reviewed and noted the following in the patient's chart:   . Medical and social history . Use of alcohol, tobacco or illicit drugs  . Current medications and supplements . Functional ability and status . Nutritional status . Physical activity . Advanced directives . List of other physicians . Hospitalizations, surgeries, and ER visits in previous 12 months . Vitals . Screenings to include cognitive, depression, and falls . Referrals and appointments  In addition, I have reviewed and discussed with patient certain preventive protocols, quality  metrics, and best practice recommendations. A written personalized care plan for preventive services as well as general preventive health recommendations were provided to patient.   Due to this being a telephonic visit, the after visit summary with patients personalized plan was offered to patient via mail or my-chart.  Patient preferred to pick up at office at next visit.   Andrez Grime, LPN   07/24/210

## 2020-07-17 NOTE — Progress Notes (Signed)
PCP notes:  Health Maintenance: Cologuard- due, needs kit   Abnormal Screenings: none   Patient concerns: none   Nurse concerns: none   Next PCP appt.: 07/25/2020 @ 8:40 am

## 2020-07-17 NOTE — Patient Instructions (Signed)
Meghan Kerr , Thank you for taking time to come for your Medicare Wellness Visit. I appreciate your ongoing commitment to your health goals. Please review the following plan we discussed and let me know if I can assist you in the future.   Screening recommendations/referrals: Colonoscopy: Cologuard due, please complete kit  Mammogram: Up to date, completed 05/30/2020, due 05/2021 Bone Density: Up to date, completed 09/06/2018, due 08/2023 Recommended yearly ophthalmology/optometry visit for glaucoma screening and checkup Recommended yearly dental visit for hygiene and checkup  Vaccinations: Influenza vaccine: Up to date, completed 08/22/2019, due 07/2020 Pneumococcal vaccine: Completed series Tdap vaccine: decline-insurance/financial Shingles vaccine: due, check with your insurance regarding coverage   Covid-19:Completed series  Advanced directives: Advance directive discussed with you today. I have provided a copy for you to complete at home and have notarized. Once this is complete please bring a copy in to our office so we can scan it into your chart.   Conditions/risks identified: diabetes, hypertension, hyperlipidemia  Next appointment: Follow up in one year for your annual wellness visit    Preventive Care 27 Years and Older, Female Preventive care refers to lifestyle choices and visits with your health care provider that can promote health and wellness. What does preventive care include?  A yearly physical exam. This is also called an annual well check.  Dental exams once or twice a year.  Routine eye exams. Ask your health care provider how often you should have your eyes checked.  Personal lifestyle choices, including:  Daily care of your teeth and gums.  Regular physical activity.  Eating a healthy diet.  Avoiding tobacco and drug use.  Limiting alcohol use.  Practicing safe sex.  Taking low-dose aspirin every day.  Taking vitamin and mineral supplements as  recommended by your health care provider. What happens during an annual well check? The services and screenings done by your health care provider during your annual well check will depend on your age, overall health, lifestyle risk factors, and family history of disease. Counseling  Your health care provider may ask you questions about your:  Alcohol use.  Tobacco use.  Drug use.  Emotional well-being.  Home and relationship well-being.  Sexual activity.  Eating habits.  History of falls.  Memory and ability to understand (cognition).  Work and work Statistician.  Reproductive health. Screening  You may have the following tests or measurements:  Height, weight, and BMI.  Blood pressure.  Lipid and cholesterol levels. These may be checked every 5 years, or more frequently if you are over 51 years old.  Skin check.  Lung cancer screening. You may have this screening every year starting at age 29 if you have a 30-pack-year history of smoking and currently smoke or have quit within the past 15 years.  Fecal occult blood test (FOBT) of the stool. You may have this test every year starting at age 45.  Flexible sigmoidoscopy or colonoscopy. You may have a sigmoidoscopy every 5 years or a colonoscopy every 10 years starting at age 79.  Hepatitis C blood test.  Hepatitis B blood test.  Sexually transmitted disease (STD) testing.  Diabetes screening. This is done by checking your blood sugar (glucose) after you have not eaten for a while (fasting). You may have this done every 1-3 years.  Bone density scan. This is done to screen for osteoporosis. You may have this done starting at age 74.  Mammogram. This may be done every 1-2 years. Talk to your health care  provider about how often you should have regular mammograms. Talk with your health care provider about your test results, treatment options, and if necessary, the need for more tests. Vaccines  Your health care  provider may recommend certain vaccines, such as:  Influenza vaccine. This is recommended every year.  Tetanus, diphtheria, and acellular pertussis (Tdap, Td) vaccine. You may need a Td booster every 10 years.  Zoster vaccine. You may need this after age 25.  Pneumococcal 13-valent conjugate (PCV13) vaccine. One dose is recommended after age 60.  Pneumococcal polysaccharide (PPSV23) vaccine. One dose is recommended after age 17. Talk to your health care provider about which screenings and vaccines you need and how often you need them. This information is not intended to replace advice given to you by your health care provider. Make sure you discuss any questions you have with your health care provider. Document Released: 01/09/2016 Document Revised: 09/01/2016 Document Reviewed: 10/14/2015 Elsevier Interactive Patient Education  2017 [employer] Prevention in the Home Falls can cause injuries. They can happen to people of all ages. There are many things you can do to make your home safe and to help prevent falls. What can I do on the outside of my home?  Regularly fix the edges of walkways and driveways and fix any cracks.  Remove anything that might make you trip as you walk through a door, such as a raised step or threshold.  Trim any bushes or trees on the path to your home.  Use bright outdoor lighting.  Clear any walking paths of anything that might make someone trip, such as rocks or tools.  Regularly check to see if handrails are loose or broken. Make sure that both sides of any steps have handrails.  Any raised decks and porches should have guardrails on the edges.  Have any leaves, snow, or ice cleared regularly.  Use sand or salt on walking paths during winter.  Clean up any spills in your garage right away. This includes oil or grease spills. What can I do in the bathroom?  Use night lights.  Install grab bars by the toilet and in the tub and shower. Do  not use towel bars as grab bars.  Use non-skid mats or decals in the tub or shower.  If you need to sit down in the shower, use a plastic, non-slip stool.  Keep the floor dry. Clean up any water that spills on the floor as soon as it happens.  Remove soap buildup in the tub or shower regularly.  Attach bath mats securely with double-sided non-slip rug tape.  Do not have throw rugs and other things on the floor that can make you trip. What can I do in the bedroom?  Use night lights.  Make sure that you have a light by your bed that is easy to reach.  Do not use any sheets or blankets that are too big for your bed. They should not hang down onto the floor.  Have a firm chair that has side arms. You can use this for support while you get dressed.  Do not have throw rugs and other things on the floor that can make you trip. What can I do in the kitchen?  Clean up any spills right away.  Avoid walking on wet floors.  Keep items that you use a lot in easy-to-reach places.  If you need to reach something above you, use a strong step stool that has a grab bar.  Keep electrical cords out of the way.  Do not use floor polish or wax that makes floors slippery. If you must use wax, use non-skid floor wax.  Do not have throw rugs and other things on the floor that can make you trip. What can I do with my stairs?  Do not leave any items on the stairs.  Make sure that there are handrails on both sides of the stairs and use them. Fix handrails that are broken or loose. Make sure that handrails are as long as the stairways.  Check any carpeting to make sure that it is firmly attached to the stairs. Fix any carpet that is loose or worn.  Avoid having throw rugs at the top or bottom of the stairs. If you do have throw rugs, attach them to the floor with carpet tape.  Make sure that you have a light switch at the top of the stairs and the bottom of the stairs. If you do not have them,  ask someone to add them for you. What else can I do to help prevent falls?  Wear shoes that:  Do not have high heels.  Have rubber bottoms.  Are comfortable and fit you well.  Are closed at the toe. Do not wear sandals.  If you use a stepladder:  Make sure that it is fully opened. Do not climb a closed stepladder.  Make sure that both sides of the stepladder are locked into place.  Ask someone to hold it for you, if possible.  Clearly mark and make sure that you can see:  Any grab bars or handrails.  First and last steps.  Where the edge of each step is.  Use tools that help you move around (mobility aids) if they are needed. These include:  Canes.  Walkers.  Scooters.  Crutches.  Turn on the lights when you go into a dark area. Replace any light bulbs as soon as they burn out.  Set up your furniture so you have a clear path. Avoid moving your furniture around.  If any of your floors are uneven, fix them.  If there are any pets around you, be aware of where they are.  Review your medicines with your doctor. Some medicines can make you feel dizzy. This can increase your chance of falling. Ask your doctor what other things that you can do to help prevent falls. This information is not intended to replace advice given to you by your health care provider. Make sure you discuss any questions you have with your health care provider. Document Released: 10/09/2009 Document Revised: 05/20/2016 Document Reviewed: 01/17/2015 Elsevier Interactive Patient Education  2017 [employer].

## 2020-07-18 ENCOUNTER — Other Ambulatory Visit: Payer: Self-pay

## 2020-07-18 ENCOUNTER — Telehealth: Payer: Self-pay | Admitting: Family Medicine

## 2020-07-18 ENCOUNTER — Other Ambulatory Visit (INDEPENDENT_AMBULATORY_CARE_PROVIDER_SITE_OTHER): Payer: Medicare PPO

## 2020-07-18 DIAGNOSIS — E119 Type 2 diabetes mellitus without complications: Secondary | ICD-10-CM

## 2020-07-18 LAB — COMPREHENSIVE METABOLIC PANEL
ALT: 11 U/L (ref 0–35)
AST: 14 U/L (ref 0–37)
Albumin: 4.1 g/dL (ref 3.5–5.2)
Alkaline Phosphatase: 98 U/L (ref 39–117)
BUN: 15 mg/dL (ref 6–23)
CO2: 32 mEq/L (ref 19–32)
Calcium: 9.4 mg/dL (ref 8.4–10.5)
Chloride: 106 mEq/L (ref 96–112)
Creatinine, Ser: 0.74 mg/dL (ref 0.40–1.20)
GFR: 77.38 mL/min (ref >60.00)
Glucose, Bld: 102 mg/dL — ABNORMAL HIGH (ref 70–99)
Potassium: 3.9 mEq/L (ref 3.5–5.1)
Sodium: 143 mEq/L (ref 135–145)
Total Bilirubin: 0.5 mg/dL (ref 0.2–1.2)
Total Protein: 6.3 g/dL (ref 6.0–8.3)

## 2020-07-18 LAB — LIPID PANEL
Cholesterol: 171 mg/dL (ref 0–200)
HDL: 56.8 mg/dL (ref >39.00)
LDL Cholesterol: 99 mg/dL (ref 0–99)
NonHDL: 113.92
Total CHOL/HDL Ratio: 3
Triglycerides: 76 mg/dL (ref 0.0–149.0)
VLDL: 15.2 mg/dL (ref 0.0–40.0)

## 2020-07-18 LAB — HEMOGLOBIN A1C: Hgb A1c MFr Bld: 6.4 % (ref 4.6–6.5)

## 2020-07-18 NOTE — Telephone Encounter (Signed)
-----   Message from Ellamae Sia sent at 07/18/2020  9:42 AM EDT ----- Regarding: lab orders for today Patient is scheduled for CPX labs, please order future labs, Thanks , Karna Christmas

## 2020-07-18 NOTE — Progress Notes (Signed)
No critical labs need to be addressed urgently. We will discuss labs in detail at upcoming office visit.   

## 2020-07-21 ENCOUNTER — Encounter: Payer: Self-pay | Admitting: Family Medicine

## 2020-07-25 ENCOUNTER — Ambulatory Visit (INDEPENDENT_AMBULATORY_CARE_PROVIDER_SITE_OTHER): Payer: Medicare PPO | Admitting: Family Medicine

## 2020-07-25 ENCOUNTER — Encounter: Payer: Self-pay | Admitting: Family Medicine

## 2020-07-25 ENCOUNTER — Other Ambulatory Visit: Payer: Self-pay

## 2020-07-25 VITALS — BP 140/68 | HR 64 | Temp 97.7°F | Ht 64.5 in | Wt 159.5 lb

## 2020-07-25 DIAGNOSIS — E785 Hyperlipidemia, unspecified: Secondary | ICD-10-CM

## 2020-07-25 DIAGNOSIS — F331 Major depressive disorder, recurrent, moderate: Secondary | ICD-10-CM | POA: Diagnosis not present

## 2020-07-25 DIAGNOSIS — Z0000 Encounter for general adult medical examination without abnormal findings: Secondary | ICD-10-CM

## 2020-07-25 DIAGNOSIS — E1169 Type 2 diabetes mellitus with other specified complication: Secondary | ICD-10-CM

## 2020-07-25 DIAGNOSIS — I10 Essential (primary) hypertension: Secondary | ICD-10-CM | POA: Diagnosis not present

## 2020-07-25 DIAGNOSIS — E1159 Type 2 diabetes mellitus with other circulatory complications: Secondary | ICD-10-CM

## 2020-07-25 DIAGNOSIS — I152 Hypertension secondary to endocrine disorders: Secondary | ICD-10-CM

## 2020-07-25 NOTE — Assessment & Plan Note (Signed)
Well controlled. Continue current medication.  

## 2020-07-25 NOTE — Assessment & Plan Note (Signed)
Stable control with diet.  

## 2020-07-25 NOTE — Assessment & Plan Note (Signed)
At goal on statin 

## 2020-07-25 NOTE — Patient Instructions (Addendum)
Return Cologuard for colon cancer screening.  Preventive Care 19 Years and Older, Female Preventive care refers to lifestyle choices and visits with your health care provider that can promote health and wellness. This includes:  A yearly physical exam. This is also called an annual well check.  Regular dental and eye exams.  Immunizations.  Screening for certain conditions.  Healthy lifestyle choices, such as diet and exercise. What can I expect for my preventive care visit? Physical exam Your health care provider will check:  Height and weight. These may be used to calculate body mass index (BMI), which is a measurement that tells if you are at a healthy weight.  Heart rate and blood pressure.  Your skin for abnormal spots. Counseling Your health care provider may ask you questions about:  Alcohol, tobacco, and drug use.  Emotional well-being.  Home and relationship well-being.  Sexual activity.  Eating habits.  History of falls.  Memory and ability to understand (cognition).  Work and work Statistician.  Pregnancy and menstrual history. What immunizations do I need?  Influenza (flu) vaccine  This is recommended every year. Tetanus, diphtheria, and pertussis (Tdap) vaccine  You may need a Td booster every 10 years. Varicella (chickenpox) vaccine  You may need this vaccine if you have not already been vaccinated. Zoster (shingles) vaccine  You may need this after age 67. Pneumococcal conjugate (PCV13) vaccine  One dose is recommended after age 66. Pneumococcal polysaccharide (PPSV23) vaccine  One dose is recommended after age 88. Measles, mumps, and rubella (MMR) vaccine  You may need at least one dose of MMR if you were born in 1957 or later. You may also need a second dose. Meningococcal conjugate (MenACWY) vaccine  You may need this if you have certain conditions. Hepatitis A vaccine  You may need this if you have certain conditions or if you  travel or work in places where you may be exposed to hepatitis A. Hepatitis B vaccine  You may need this if you have certain conditions or if you travel or work in places where you may be exposed to hepatitis B. Haemophilus influenzae type b (Hib) vaccine  You may need this if you have certain conditions. You may receive vaccines as individual doses or as more than one vaccine together in one shot (combination vaccines). Talk with your health care provider about the risks and benefits of combination vaccines. What tests do I need? Blood tests  Lipid and cholesterol levels. These may be checked every 5 years, or more frequently depending on your overall health.  Hepatitis C test.  Hepatitis B test. Screening  Lung cancer screening. You may have this screening every year starting at age 72 if you have a 30-pack-year history of smoking and currently smoke or have quit within the past 15 years.  Colorectal cancer screening. All adults should have this screening starting at age 22 and continuing until age 56. Your health care provider may recommend screening at age 57 if you are at increased risk. You will have tests every 1-10 years, depending on your results and the type of screening test.  Diabetes screening. This is done by checking your blood sugar (glucose) after you have not eaten for a while (fasting). You may have this done every 1-3 years.  Mammogram. This may be done every 1-2 years. Talk with your health care provider about how often you should have regular mammograms.  BRCA-related cancer screening. This may be done if you have a family history  of breast, ovarian, tubal, or peritoneal cancers. Other tests  Sexually transmitted disease (STD) testing.  Bone density scan. This is done to screen for osteoporosis. You may have this done starting at age 45. Follow these instructions at home: Eating and drinking  Eat a diet that includes fresh fruits and vegetables, whole grains,  lean protein, and low-fat dairy products. Limit your intake of foods with high amounts of sugar, saturated fats, and salt.  Take vitamin and mineral supplements as recommended by your health care provider.  Do not drink alcohol if your health care provider tells you not to drink.  If you drink alcohol: ? Limit how much you have to 0-1 drink a day. ? Be aware of how much alcohol is in your drink. In the [place] one drink equals one 12 oz bottle of beer (355 mL), one 5 oz glass of wine (148 mL), or one 1 oz glass of hard liquor (44 mL). Lifestyle  Take daily care of your teeth and gums.  Stay active. Exercise for at least 30 minutes on 5 or more days each week.  Do not use any products that contain nicotine or tobacco, such as cigarettes, e-cigarettes, and chewing tobacco. If you need help quitting, ask your health care provider.  If you are sexually active, practice safe sex. Use a condom or other form of protection in order to prevent STIs (sexually transmitted infections).  Talk with your health care provider about taking a low-dose aspirin or statin. What's next?  Go to your health care provider once a year for a well check visit.  Ask your health care provider how often you should have your eyes and teeth checked.  Stay up to date on all vaccines. This information is not intended to replace advice given to you by your health care provider. Make sure you discuss any questions you have with your health care provider. Document Revised: 12/07/2018 Document Reviewed: 12/07/2018 Elsevier Patient Education  2020 [employer].

## 2020-07-25 NOTE — Progress Notes (Signed)
Chief Complaint  Patient presents with  . Annual Exam    Part 2    History of Present Illness: HPI  The patient presents for  complete physical and review of chronic health problems. He/She also has the following acute concerns today: Having spell today of vertigo.. took a meclizine. Hx of BPPV.  The patient saw a LPN or RN for medicare wellness visit.  Prevention and wellness was reviewed in detail. Note reviewed and important notes copied below.  Health Maintenance: Cologuard- due, needs kit Abnormal Screenings: none    Today:07/25/20  Diabetes:   Good control with low carb diet. Lab Results  Component Value Date   HGBA1C 6.4 07/18/2020  Using medications without difficulties: Hypoglycemic episodes: Hyperglycemic episodes: Feet problems: none Blood Sugars averaging:not chekcing eye exam within last year: yes  Hypertension:   Almost at goal... having some vertigo today.   BP Readings from Last 3 Encounters:  07/25/20 (!) 140/68  05/09/20 124/64  04/11/20 120/70  Using medication without problems or lightheadedness:  None Chest pain with exertion:none Edema:none Short of breath:none Average home BPs: Other issues:  Elevated Cholesterol:  Lab Results  Component Value Date   CHOL 171 07/18/2020   HDL 56.80 07/18/2020   LDLCALC 99 07/18/2020   LDLDIRECT 190.1 07/15/2011   TRIG 76.0 07/18/2020   CHOLHDL 3 07/18/2020  Using medications without problems: Muscle aches:  Diet compliance: veggies, low carb Exercise: walking, arm exercises, push up. Other complaints:   MDD/GAD: Stable control on venlafaxine.   This visit occurred during the SARS-CoV-2 public health emergency.  Safety protocols were in place, including screening questions prior to the visit, additional usage of staff PPE, and extensive cleaning of exam room while observing appropriate contact time as indicated for disinfecting solutions.   COVID 19 screen:  No recent travel or known exposure  to COVID19 The patient denies respiratory symptoms of COVID 19 at this time. The importance of social distancing was discussed today.     Review of Systems  Constitutional: Negative for chills and fever.  HENT: Negative for congestion and ear pain.   Eyes: Negative for pain and redness.  Respiratory: Negative for cough and shortness of breath.   Cardiovascular: Negative for chest pain, palpitations and leg swelling.  Gastrointestinal: Negative for abdominal pain, blood in stool, constipation, diarrhea, nausea and vomiting.  Genitourinary: Negative for dysuria.  Musculoskeletal: Negative for falls and myalgias.  Skin: Negative for rash.  Neurological: Negative for dizziness.  Psychiatric/Behavioral: Negative for depression. The patient is not nervous/anxious.       Past Medical History:  Diagnosis Date  . Hyperlipidemia   . Hypertension     reports that she has never smoked. She has never used smokeless tobacco. She reports that she does not drink alcohol and does not use drugs.   Current Outpatient Medications:  .  amLODipine (NORVASC) 5 MG tablet, Take 1 tablet (5 mg total) by mouth daily., Disp: 30 tablet, Rfl: 11 .  Apoaequorin (PREVAGEN PO), Take 1 tablet by mouth daily., Disp: , Rfl:  .  b complex vitamins tablet, Take 1 tablet by mouth daily.  , Disp: , Rfl:  .  Blood Glucose Monitoring Suppl (ONE TOUCH ULTRA MINI) w/Device KIT, USE TO CHECK BLOOD SUGAR DAILY, Disp: 1 each, Rfl: 0 .  folic acid (FOLVITE) 1 MG tablet, Take 1 mg by mouth daily. , Disp: , Rfl:  .  glucose blood (ONETOUCH ULTRA) test strip, USE TO CHECK BLOOD SUGAR DAILY, Disp: 100  each, Rfl: 3 .  Lancets (ONETOUCH ULTRASOFT) lancets, USE TO CHECK BLOOD SUGAR DAILY, Disp: 100 each, Rfl: 3 .  methotrexate (RHEUMATREX) 2.5 MG tablet, Take 10 mg by mouth. , Disp: , Rfl:  .  Nutritional Supplements (JUICE PLUS FIBRE PO), Take by mouth daily., Disp: , Rfl:  .  rosuvastatin (CRESTOR) 5 MG tablet, Take 1 tablet (5 mg  total) by mouth every other day., Disp: 45 tablet, Rfl: 1 .  venlafaxine XR (EFFEXOR-XR) 75 MG 24 hr capsule, TAKE 1 CAPSULE BY MOUTH EVERY DAY, Disp: 90 capsule, Rfl: 1 .  Vitamin D, Cholecalciferol, 10 MCG (400 UNIT) CHEW, Chew 1 tablet by mouth daily., Disp: , Rfl:    Observations/Objective: Blood pressure (!) 140/68, pulse 64, temperature 97.7 F (36.5 C), temperature source Temporal, height 5' 4.5" (1.638 m), weight 159 lb 8 oz (72.3 kg), SpO2 95 %.  Physical Exam Constitutional:      General: She is not in acute distress.    Appearance: Normal appearance. She is well-developed. She is not ill-appearing or toxic-appearing.  HENT:     Head: Normocephalic.     Right Ear: Hearing, tympanic membrane, ear canal and external ear normal. Tympanic membrane is not erythematous, retracted or bulging.     Left Ear: Hearing, tympanic membrane, ear canal and external ear normal. Tympanic membrane is not erythematous, retracted or bulging.     Nose: No mucosal edema or rhinorrhea.     Right Sinus: No maxillary sinus tenderness or frontal sinus tenderness.     Left Sinus: No maxillary sinus tenderness or frontal sinus tenderness.     Mouth/Throat:     Pharynx: Uvula midline.  Eyes:     General: Lids are normal. Lids are everted, no foreign bodies appreciated.     Conjunctiva/sclera: Conjunctivae normal.     Pupils: Pupils are equal, round, and reactive to light.  Neck:     Thyroid: No thyroid mass or thyromegaly.     Vascular: No carotid bruit.     Trachea: Trachea normal.  Cardiovascular:     Rate and Rhythm: Normal rate and regular rhythm.     Pulses: Normal pulses.     Heart sounds: Normal heart sounds, S1 normal and S2 normal. No murmur heard.  No friction rub. No gallop.   Pulmonary:     Effort: Pulmonary effort is normal. No tachypnea or respiratory distress.     Breath sounds: Normal breath sounds. No decreased breath sounds, wheezing, rhonchi or rales.  Abdominal:     General:  Bowel sounds are normal.     Palpations: Abdomen is soft.     Tenderness: There is no abdominal tenderness.  Musculoskeletal:     Cervical back: Normal range of motion and neck supple.  Skin:    General: Skin is warm and dry.     Findings: No rash.  Neurological:     Mental Status: She is alert and oriented to person, place, and time.     GCS: GCS eye subscore is 4. GCS verbal subscore is 5. GCS motor subscore is 6.     Cranial Nerves: No cranial nerve deficit.     Sensory: No sensory deficit.     Motor: No abnormal muscle tone.     Coordination: Coordination normal.     Gait: Gait normal.     Deep Tendon Reflexes: Reflexes are normal and symmetric.     Comments: Nml cerebellar exam   No papilledema  Vertigo triggered by sitting up  quickly.. horizontal nystagmus  Psychiatric:        Mood and Affect: Mood is not anxious or depressed.        Speech: Speech normal.        Behavior: Behavior normal. Behavior is cooperative.        Thought Content: Thought content normal.        Cognition and Memory: Memory is not impaired. She does not exhibit impaired recent memory or impaired remote memory.        Judgment: Judgment normal.      Diabetic foot exam: Normal inspection No skin breakdown No calluses  Normal DP pulses Normal sensation to light touch and monofilament Nails normal  Assessment and Plan   The patient's preventative maintenance and recommended screening tests for an annual wellness exam were reviewed in full today. Brought up to date unless services declined.  Counselled on the importance of diet, exercise, and its role in overall health and mortality. The patient's FH and SH was reviewed, including their home life, tobacco status, and drug and alcohol status.    Vaccines; Uptodate PNA,declined zostavax,  S/P COVID19 vaccine DVE/pap:pap not indicated, DVEnot indicated Colon: Date of Last Colonoscopy: 05/28/2007.. In [place] Results: Hyperplastic Polyp,  recommended repeat in 5 years. She request stool ifob instead. Had ifob neg 05/2015. NOW  Cologuard.. nml 03/2017, repeat due now. Mammo:05/2020 repeat in 1years. DEXA: osteopenia stable T-1.8 spine 09/06/2018, repeat in 5 years. Hep C: completed.  Type 2 diabetes mellitus with other circulatory complications (HTN) (HCC)  Stable control with diet.  Hyperlipidemia associated with type 2 diabetes mellitus ([place])  At goal on statin  Hypertension associated with diabetes ([place]) Borderline in office today. At home controlled on  Amlodipine.  Major depressive disorder, recurrent episode, moderate (HCC) Well controlled. Continue current medication.       Eliezer Lofts, MD

## 2020-07-25 NOTE — Assessment & Plan Note (Signed)
Borderline in office today. At home controlled on  Amlodipine.

## 2020-08-10 ENCOUNTER — Other Ambulatory Visit: Payer: Self-pay | Admitting: Family Medicine

## 2020-08-13 DIAGNOSIS — Z1212 Encounter for screening for malignant neoplasm of rectum: Secondary | ICD-10-CM | POA: Diagnosis not present

## 2020-08-13 DIAGNOSIS — Z1211 Encounter for screening for malignant neoplasm of colon: Secondary | ICD-10-CM | POA: Diagnosis not present

## 2020-08-13 LAB — COLOGUARD: Cologuard: NEGATIVE

## 2020-08-19 LAB — COLOGUARD: COLOGUARD: NEGATIVE

## 2020-08-20 ENCOUNTER — Other Ambulatory Visit: Payer: Self-pay | Admitting: Family Medicine

## 2020-08-20 NOTE — Telephone Encounter (Signed)
Last office visit 07/25/2020 for CPE.  Not on current medication list but spoke with patient and she would like a refill just to have on hand.  Ok to refill?

## 2020-08-22 ENCOUNTER — Encounter: Payer: Self-pay | Admitting: Family Medicine

## 2020-08-27 DIAGNOSIS — H35051 Retinal neovascularization, unspecified, right eye: Secondary | ICD-10-CM | POA: Diagnosis not present

## 2020-09-22 ENCOUNTER — Other Ambulatory Visit: Payer: Self-pay | Admitting: Family Medicine

## 2020-09-23 NOTE — Telephone Encounter (Signed)
Last office visit 07/25/2020 for CPE.  Last refilled 08/20/2020 for 30 g with no refills.  No future appointments with PCP.

## 2020-10-08 DIAGNOSIS — H35051 Retinal neovascularization, unspecified, right eye: Secondary | ICD-10-CM | POA: Diagnosis not present

## 2020-11-12 ENCOUNTER — Other Ambulatory Visit: Payer: Self-pay | Admitting: Family Medicine

## 2020-12-03 DIAGNOSIS — H35051 Retinal neovascularization, unspecified, right eye: Secondary | ICD-10-CM | POA: Diagnosis not present

## 2020-12-05 ENCOUNTER — Other Ambulatory Visit: Payer: Self-pay | Admitting: Family Medicine

## 2020-12-05 NOTE — Telephone Encounter (Signed)
Last office visit 07/25/2020 for CPE. Last refilled 09/23/2020 for 30 g with no refills.  No future appointments with PCP.

## 2021-01-08 ENCOUNTER — Other Ambulatory Visit: Payer: Self-pay | Admitting: Family Medicine

## 2021-01-08 ENCOUNTER — Other Ambulatory Visit: Payer: Self-pay | Admitting: Dermatology

## 2021-01-08 NOTE — Telephone Encounter (Signed)
Last office visit 07/25/2020 for CPE.  Last refilled 12/05/2020 for 30 g with no refills.  No future appointments with PCP.

## 2021-01-13 ENCOUNTER — Other Ambulatory Visit: Payer: Self-pay | Admitting: Family Medicine

## 2021-01-21 DIAGNOSIS — H35051 Retinal neovascularization, unspecified, right eye: Secondary | ICD-10-CM | POA: Diagnosis not present

## 2021-02-06 ENCOUNTER — Other Ambulatory Visit: Payer: Self-pay

## 2021-02-06 ENCOUNTER — Ambulatory Visit: Payer: Medicare PPO | Admitting: Family Medicine

## 2021-02-06 ENCOUNTER — Encounter: Payer: Self-pay | Admitting: Family Medicine

## 2021-02-06 VITALS — BP 122/70 | HR 65 | Temp 99.2°F | Ht 64.5 in | Wt 165.8 lb

## 2021-02-06 DIAGNOSIS — R202 Paresthesia of skin: Secondary | ICD-10-CM

## 2021-02-06 DIAGNOSIS — R519 Headache, unspecified: Secondary | ICD-10-CM | POA: Diagnosis not present

## 2021-02-06 DIAGNOSIS — R200 Anesthesia of skin: Secondary | ICD-10-CM | POA: Insufficient documentation

## 2021-02-06 LAB — TSH: TSH: 0.89 u[IU]/mL (ref 0.35–4.50)

## 2021-02-06 LAB — SEDIMENTATION RATE: Sed Rate: 5 mm/hr (ref 0–30)

## 2021-02-06 LAB — VITAMIN B12: Vitamin B-12: 197 pg/mL — ABNORMAL LOW (ref 211–911)

## 2021-02-06 NOTE — Progress Notes (Signed)
Patient ID: Meghan Kerr, female    DOB: October 27, 1951, 70 y.o.   MRN: [id number]  This visit was conducted in person.  BP 122/70   Pulse 65   Temp 99.2 F (37.3 C) (Temporal)   Ht 5' 4.5" (1.638 m)   Wt 165 lb 12 oz (75.2 kg)   SpO2 99%   BMI 28.01 kg/m    CC:  Chief Complaint  Patient presents with  . Tingling    Left Side of face around eye    Subjective:   HPI: ELLENA Kerr is a 70 y.o. female presenting on 02/06/2021 for Tingling (Left Side of face around eye)   She has noted tingling on left side of face around eye at  temple a, occ pressure at side of left temple and over cheek under left eye.  Ongoing intermittently for 1-2 years.  No headaches Seems to be worse when sinus congestion.  no new left eye changes, no left eye issues.   No post nasal drip, no nausea/vomiting.   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     no shoulder or hip girdle stiffness. Had radium treatment x 2 weeks at 17 months old from skin lesion.  Relevant past medical, surgical, family and social history reviewed and updated as indicated. Interim medical history since our last visit reviewed. Allergies and medications reviewed and updated. Outpatient Medications Prior to Visit  Medication Sig Dispense Refill  . amLODipine (NORVASC) 5 MG tablet Take 1 tablet (5 mg total) by mouth daily. 30 tablet 11  . Apoaequorin (PREVAGEN PO) Take 1 tablet by mouth daily.    . Blood Glucose Monitoring Suppl (ONE TOUCH ULTRA MINI) w/Device KIT USE TO CHECK BLOOD SUGAR DAILY 1 each 0  . folic acid (FOLVITE) 1 MG tablet Take 1 mg by mouth daily.     Marland Kitchen glucose blood (ONETOUCH ULTRA) test strip USE TO CHECK BLOOD SUGAR DAILY 100 each 3  . Lancets (ONETOUCH ULTRASOFT) lancets USE TO CHECK BLOOD SUGAR DAILY 100 each 3  . methotrexate (RHEUMATREX) 2.5 MG tablet Take 5 mg by mouth once a week.    . rosuvastatin (CRESTOR) 5 MG tablet TAKE 1 TABLET BY MOUTH EVERY OTHER DAY 45 tablet 3  .  triamcinolone cream (KENALOG) 0.5 % APPLY TO AFFECTED AREA TWICE A DAY 30 g 0  . triamcinolone ointment (KENALOG) 0.1 % APPLY ON THE SKIN TWICE A DAY AS NEEDED FOR ITCH. AVOID FACE, GROIN, UNDERARMS. 454 g 1  . venlafaxine XR (EFFEXOR-XR) 75 MG 24 hr capsule TAKE 1 CAPSULE BY MOUTH EVERY DAY 90 capsule 1  . Vitamin D, Cholecalciferol, 10 MCG (400 UNIT) CHEW Chew 1 tablet by mouth daily.    Marland Kitchen b complex vitamins tablet Take 1 tablet by mouth daily.      . Nutritional Supplements (JUICE PLUS FIBRE PO) Take by mouth daily.     No facility-administered medications prior to visit.     Per HPI unless specifically indicated in ROS section below Review of Systems  Constitutional: Negative for fatigue and fever.  HENT: Negative for ear pain.   Eyes: Negative for pain.  Respiratory: Negative for chest tightness and shortness of breath.   Cardiovascular: Negative for chest pain, palpitations and leg swelling.  Gastrointestinal: Negative for abdominal pain.  Genitourinary: Negative for dysuria.   Objective:  BP 122/70   Pulse 65   Temp 99.2 F (37.3 C) (Temporal)   Ht 5' 4.5" (1.638 m)  Wt 165 lb 12 oz (75.2 kg)   SpO2 99%   BMI 28.01 kg/m   Wt Readings from Last 3 Encounters:  02/06/21 165 lb 12 oz (75.2 kg)  07/25/20 159 lb 8 oz (72.3 kg)  05/09/20 157 lb 12 oz (71.6 kg)      Physical Exam Constitutional:      General: She is not in acute distress.Vital signs are normal.     Appearance: Normal appearance. She is well-developed and well-nourished. She is not ill-appearing or toxic-appearing.  HENT:     Head: Normocephalic.     Jaw: There is normal jaw occlusion. No tenderness or pain on movement.     Right Ear: Hearing, tympanic membrane, ear canal and external ear normal. Tympanic membrane is not erythematous, retracted or bulging.     Left Ear: Hearing, tympanic membrane, ear canal and external ear normal. Tympanic membrane is not erythematous, retracted or bulging.     Nose: No  mucosal edema or rhinorrhea.     Right Turbinates: Not enlarged.     Left Turbinates: Not enlarged.     Right Sinus: No maxillary sinus tenderness or frontal sinus tenderness.     Left Sinus: Maxillary sinus tenderness present. No frontal sinus tenderness.     Mouth/Throat:     Mouth: Oropharynx is clear and moist and mucous membranes are normal.     Pharynx: Uvula midline.  Eyes:     General: Lids are normal. Lids are everted, no foreign bodies appreciated.     Extraocular Movements: EOM normal.     Conjunctiva/sclera: Conjunctivae normal.     Pupils: Pupils are equal, round, and reactive to light.  Neck:     Thyroid: No thyroid mass or thyromegaly.     Vascular: No carotid bruit.     Trachea: Trachea normal.  Cardiovascular:     Rate and Rhythm: Normal rate and regular rhythm.     Pulses: Normal pulses and intact distal pulses.     Heart sounds: Normal heart sounds, S1 normal and S2 normal. No murmur heard. No friction rub. No gallop.   Pulmonary:     Effort: Pulmonary effort is normal. No tachypnea or respiratory distress.     Breath sounds: Normal breath sounds. No decreased breath sounds, wheezing, rhonchi or rales.  Abdominal:     General: Bowel sounds are normal.     Palpations: Abdomen is soft.     Tenderness: There is no abdominal tenderness.  Musculoskeletal:     Cervical back: Normal range of motion and neck supple.  Skin:    General: Skin is warm, dry and intact.     Findings: No rash.     Comments: Slightly more porminnent veins at left temple  Neurological:     Mental Status: She is alert.  Psychiatric:        Mood and Affect: Mood is not anxious or depressed.        Speech: Speech normal.        Behavior: Behavior normal. Behavior is cooperative.        Thought Content: Thought content normal.        Cognition and Memory: Cognition and memory normal.        Judgment: Judgment normal.       Results for orders placed or performed in visit on 08/22/20   Cologuard  Result Value Ref Range   Cologuard Negative Negative    This visit occurred during the SARS-CoV-2 public health emergency.  Safety  protocols were in place, including screening questions prior to the visit, additional usage of staff PPE, and extensive cleaning of exam room while observing appropriate contact time as indicated for disinfecting solutions.   COVID 19 screen:  No recent travel or known exposure to COVID19 The patient denies respiratory symptoms of COVID 19 at this time. The importance of social distancing was discussed today.   Assessment and Plan   Left temple pain , numbness associated with pressure in left  maxillary area... eval for temple arteritis with sed rate.  Given tingling eval with B12 anld TSH.   If labs negative consider CT sinuses given worse wen congestion to eval for polp etc.  No red flags  For intracranial processes.  ? Related to past Bells Palsy. Eliezer Lofts, MD

## 2021-02-06 NOTE — Patient Instructions (Signed)
Please stop at the lab to have labs drawn.  

## 2021-02-09 ENCOUNTER — Telehealth: Payer: Self-pay | Admitting: Family Medicine

## 2021-02-09 NOTE — Telephone Encounter (Signed)
Patient returned your call about lab results. Please call her back. EM

## 2021-02-09 NOTE — Telephone Encounter (Signed)
Lab results discussed with [name].  See result note from 02/06/21.

## 2021-02-13 ENCOUNTER — Other Ambulatory Visit: Payer: Self-pay

## 2021-02-13 ENCOUNTER — Encounter: Payer: Self-pay | Admitting: Family Medicine

## 2021-02-13 ENCOUNTER — Ambulatory Visit (INDEPENDENT_AMBULATORY_CARE_PROVIDER_SITE_OTHER)
Admission: RE | Admit: 2021-02-13 | Discharge: 2021-02-13 | Disposition: A | Discharge status: Home / Self Care | Payer: Medicare PPO | Source: Ambulatory Visit | Attending: Family Medicine | Admitting: Family Medicine

## 2021-02-13 ENCOUNTER — Ambulatory Visit: Payer: Medicare PPO | Admitting: Family Medicine

## 2021-02-13 VITALS — BP 130/72 | HR 65 | Temp 98.4°F | Ht 64.5 in | Wt 165.8 lb

## 2021-02-13 DIAGNOSIS — M79602 Pain in left arm: Secondary | ICD-10-CM

## 2021-02-13 DIAGNOSIS — M47812 Spondylosis without myelopathy or radiculopathy, cervical region: Secondary | ICD-10-CM | POA: Diagnosis not present

## 2021-02-13 DIAGNOSIS — M542 Cervicalgia: Secondary | ICD-10-CM | POA: Diagnosis not present

## 2021-02-13 DIAGNOSIS — M4312 Spondylolisthesis, cervical region: Secondary | ICD-10-CM | POA: Diagnosis not present

## 2021-02-13 MED ORDER — PREDNISONE 20 MG PO TABS: ORAL_TABLET | ORAL | 0 refills | Status: DC

## 2021-02-13 MED ORDER — CYCLOBENZAPRINE HCL 10 MG PO TABS: 5.0000 mg | ORAL_TABLET | Freq: Every evening | ORAL | 0 refills | Status: DC | PRN

## 2021-02-13 NOTE — Progress Notes (Signed)
Patient ID: Meghan Kerr, female    DOB: 12/22/1951, 70 y.o.   MRN: [id number]  This visit was conducted in person.  BP 130/72   Pulse 65   Temp 98.4 F (36.9 C) (Temporal)   Ht 5' 4.5" (1.638 m)   Wt 165 lb 12 oz (75.2 kg)   SpO2 96%   BMI 28.01 kg/m    CC:  Chief Complaint  Patient presents with  . Arm Pain    Left Upper Arm    Subjective:   HPI: Meghan Kerr is a 70 y.o. female presenting on 02/13/2021 for Arm Pain (Left Upper Arm)   She recently was seen for left temple pain and she now reports new left arm pain  Sed rate, TSH  were normal.  She was low in B12 and was started on an OTC supplement.   She reports sudden onset pain, continued aching, throbbing pain.  Pain in left upper arm, now pain is radiating up to left shoulder and neck.  No redness, no swelling in joints or arm.  Pain occ wakes her in night  No chest pain, no SOB.  No change with movement.  Try acetaminophen .. helps temporarily.   Occurred when watching grand sons.Marland Kitchen no change in activity, no falls.  She has not noted temple pain, tingling lately.    On methotrexate for skin issues.. has follow up to come off with derm soon.          Relevant past medical, surgical, family and social history reviewed and updated as indicated. Interim medical history since our last visit reviewed. Allergies and medications reviewed and updated. Outpatient Medications Prior to Visit  Medication Sig Dispense Refill  . amLODipine (NORVASC) 5 MG tablet Take 1 tablet (5 mg total) by mouth daily. 30 tablet 11  . Apoaequorin (PREVAGEN PO) Take 1 tablet by mouth daily.    . Blood Glucose Monitoring Suppl (ONE TOUCH ULTRA MINI) w/Device KIT USE TO CHECK BLOOD SUGAR DAILY 1 each 0  . folic acid (FOLVITE) 1 MG tablet Take 1 mg by mouth daily.     Marland Kitchen glucose blood (ONETOUCH ULTRA) test strip USE TO CHECK BLOOD SUGAR DAILY 100 each 3  . Lancets (ONETOUCH ULTRASOFT) lancets USE TO CHECK BLOOD SUGAR DAILY  100 each 3  . methotrexate (RHEUMATREX) 2.5 MG tablet Take 5 mg by mouth once a week.    . rosuvastatin (CRESTOR) 5 MG tablet TAKE 1 TABLET BY MOUTH EVERY OTHER DAY 45 tablet 3  . triamcinolone cream (KENALOG) 0.5 % APPLY TO AFFECTED AREA TWICE A DAY 30 g 0  . triamcinolone ointment (KENALOG) 0.1 % APPLY ON THE SKIN TWICE A DAY AS NEEDED FOR ITCH. AVOID FACE, GROIN, UNDERARMS. 454 g 1  . venlafaxine XR (EFFEXOR-XR) 75 MG 24 hr capsule TAKE 1 CAPSULE BY MOUTH EVERY DAY 90 capsule 1  . Vitamin D, Cholecalciferol, 10 MCG (400 UNIT) CHEW Chew 1 tablet by mouth daily.     No facility-administered medications prior to visit.     Per HPI unless specifically indicated in ROS section below Review of Systems  Constitutional: Negative for fatigue and fever.  HENT: Negative for congestion.   Eyes: Negative for pain.  Respiratory: Negative for cough and shortness of breath.   Cardiovascular: Negative for chest pain, palpitations and leg swelling.  Gastrointestinal: Negative for abdominal pain.  Genitourinary: Negative for dysuria and vaginal bleeding.  Musculoskeletal: Negative for back pain.  Neurological: Negative for syncope, light-headedness  and headaches.  Psychiatric/Behavioral: Negative for dysphoric mood.   Objective:  BP 130/72   Pulse 65   Temp 98.4 F (36.9 C) (Temporal)   Ht 5' 4.5" (1.638 m)   Wt 165 lb 12 oz (75.2 kg)   SpO2 96%   BMI 28.01 kg/m   Wt Readings from Last 3 Encounters:  02/13/21 165 lb 12 oz (75.2 kg)  02/06/21 165 lb 12 oz (75.2 kg)  07/25/20 159 lb 8 oz (72.3 kg)      Physical Exam Constitutional:      General: She is not in acute distress.    Appearance: Normal appearance. She is well-developed. She is not ill-appearing or toxic-appearing.  HENT:     Head: Normocephalic.     Right Ear: Hearing, tympanic membrane, ear canal and external ear normal. Tympanic membrane is not erythematous, retracted or bulging.     Left Ear: Hearing, tympanic membrane, ear  canal and external ear normal. Tympanic membrane is not erythematous, retracted or bulging.     Nose: No mucosal edema or rhinorrhea.     Right Sinus: No maxillary sinus tenderness or frontal sinus tenderness.     Left Sinus: No maxillary sinus tenderness or frontal sinus tenderness.     Mouth/Throat:     Pharynx: Uvula midline.  Eyes:     General: Lids are normal. Lids are everted, no foreign bodies appreciated.     Conjunctiva/sclera: Conjunctivae normal.     Pupils: Pupils are equal, round, and reactive to light.  Neck:     Thyroid: No thyroid mass or thyromegaly.     Vascular: No carotid bruit.     Trachea: Trachea normal.  Cardiovascular:     Rate and Rhythm: Normal rate and regular rhythm.     Pulses: Normal pulses.     Heart sounds: Normal heart sounds, S1 normal and S2 normal. No murmur heard. No friction rub. No gallop.   Pulmonary:     Effort: Pulmonary effort is normal. No tachypnea or respiratory distress.     Breath sounds: Normal breath sounds. No decreased breath sounds, wheezing, rhonchi or rales.  Abdominal:     General: Bowel sounds are normal.     Palpations: Abdomen is soft.     Tenderness: There is no abdominal tenderness.  Musculoskeletal:     Left shoulder: Tenderness present. No swelling or bony tenderness. Decreased range of motion. Normal strength. Normal pulse.     Right upper arm: Tenderness present. No swelling or bony tenderness.     Cervical back: Normal range of motion and neck supple. Pain with movement and muscular tenderness present. No spinous process tenderness.  Skin:    General: Skin is warm and dry.     Findings: No rash.  Neurological:     Mental Status: She is alert.  Psychiatric:        Mood and Affect: Mood is not anxious or depressed.        Speech: Speech normal.        Behavior: Behavior normal. Behavior is cooperative.        Thought Content: Thought content normal.        Judgment: Judgment normal.       Results for orders  placed or performed in visit on 02/06/21  Sedimentation Rate  Result Value Ref Range   Sed Rate 5 0 - 30 mm/hr  Vitamin B12  Result Value Ref Range   Vitamin B-12 197 (L) 211 - 911 pg/mL  TSH  Result Value Ref Range   TSH 0.89 0.35 - 4.50 uIU/mL    This visit occurred during the SARS-CoV-2 public health emergency.  Safety protocols were in place, including screening questions prior to the visit, additional usage of staff PPE, and extensive cleaning of exam room while observing appropriate contact time as indicated for disinfecting solutions.   COVID 19 screen:  No recent travel or known exposure to COVID19 The patient denies respiratory symptoms of COVID 19 at this time. The importance of social distancing was discussed today.   Assessment and Plan Problem List Items Addressed This Visit    Left arm pain - Primary    Likely cervical radiculopathy causing arm pain.  Eval with X-ray of cervical spine and left shoulder eval given some pain  Subacromially and decreased ROM shoulder.  Treat with ice, prednisone taper and muscle relaxant.      Relevant Orders   DG Cervical Spine Complete (Completed)   DG Shoulder Left (Completed)    Other Visit Diagnoses    Neck pain       Relevant Orders   DG Cervical Spine Complete (Completed)   DG Shoulder Left (Completed)     Meds ordered this encounter  Medications  . DISCONTD: predniSONE (DELTASONE) 20 MG tablet    Sig: 3 tabs by mouth daily x 3 days, then 2 tabs by mouth daily x 2 days then 1 tab by mouth daily x 2 days    Dispense:  15 tablet    Refill:  0  . DISCONTD: cyclobenzaprine (FLEXERIL) 10 MG tablet    Sig: Take 0.5-1 tablets (5-10 mg total) by mouth at bedtime as needed for muscle spasms.    Dispense:  15 tablet    Refill:  0       Eliezer Lofts, MD

## 2021-02-13 NOTE — Patient Instructions (Signed)
Likely cervical radiculopathy.  We will call with  X-ray results.  Start prednisone taper in AMs, can use muscle relaxant at night.

## 2021-02-16 ENCOUNTER — Telehealth: Payer: Self-pay | Admitting: *Deleted

## 2021-02-16 MED ORDER — TRAMADOL HCL 50 MG PO TABS: 50.0000 mg | ORAL_TABLET | Freq: Three times a day (TID) | ORAL | 0 refills | Status: AC | PRN

## 2021-02-16 NOTE — Telephone Encounter (Signed)
Call  if pain is not controlled.. I can send in a course of tramadol to use prn pain. Let me know.

## 2021-02-16 NOTE — Telephone Encounter (Signed)
Patient left a voicemail stating that she was in to see Dr. Diona Browner Friday. Patient stated that she was seen for left arm pain. Patient stated the pain now is more in her left shoulder and neck. Patient stated that she is taking the Prednisone and tylenol every few hours. Patient stated that she wants to know if there is anything else she can do for the pain. 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

## 2021-02-16 NOTE — Addendum Note (Signed)
Addended by: Eliezer Lofts E on: 02/16/2021 03:11 PM   Modules accepted: Orders

## 2021-02-16 NOTE — Telephone Encounter (Signed)
Spoke with Mrs. Hardin Negus.  She would like to try the Tramadol.  [facility] in [place]

## 2021-02-18 ENCOUNTER — Other Ambulatory Visit: Payer: Self-pay | Admitting: Family Medicine

## 2021-02-19 ENCOUNTER — Ambulatory Visit: Payer: Medicare PPO | Admitting: Dermatology

## 2021-02-19 ENCOUNTER — Telehealth: Payer: Self-pay

## 2021-02-19 ENCOUNTER — Other Ambulatory Visit: Payer: Self-pay

## 2021-02-19 DIAGNOSIS — L988 Other specified disorders of the skin and subcutaneous tissue: Secondary | ICD-10-CM | POA: Diagnosis not present

## 2021-02-19 DIAGNOSIS — L309 Dermatitis, unspecified: Secondary | ICD-10-CM

## 2021-02-19 DIAGNOSIS — I8393 Asymptomatic varicose veins of bilateral lower extremities: Secondary | ICD-10-CM

## 2021-02-19 MED ORDER — PREDNISONE 20 MG PO TABS: ORAL_TABLET | ORAL | 0 refills | Status: DC

## 2021-02-19 NOTE — Progress Notes (Signed)
   Follow-Up Visit   Subjective  Meghan Kerr is a 70 y.o. female who presents for the following: Dermatitis (Patient here to follow up on her dermatitis. Her last visit with [place] was 10/11/2019 and we referred her to [facility]. She has been seeing them in [place] and they have now moved to [place]. [facility] prescribed pt MTX and she wants to get off of it since her dermatitis has cleared. ) and Facial Elastosis (Patient also would like to discuss sclerotherapy and treatments for facial elastosis. ).  She has weaned herself down to 2 tablets of 2.5mg  once weekly.   The following portions of the chart were reviewed this encounter and updated as appropriate:   Tobacco  Allergies  Meds  Problems  Med Hx  Surg Hx  Fam Hx      Review of Systems:  No other skin or systemic complaints except as noted in HPI or Assessment and Plan.  Objective  Well appearing patient in no apparent distress; mood and affect are within normal limits.  A focused examination was performed including face, hands, legs. Relevant physical exam findings are noted in the Assessment and Plan.  Objective  face and hands: Rash is clear today  Objective  Right Lower Leg - Anterior: Spider veins  Objective  face: Rhytides and volume loss.    Assessment & Plan  Dermatitis face and hands  Severe dermatitis currently well controlled on methotrexate.  She has decreased to 5 mg weekly and would like to know if okay to stop and see if rash stays gone.  Advised that okay to stop methotrexate.  Would recommend continuing folic acid for the next 2 to 4 weeks   If rash recurs after stopping methotrexate, may consider Dupixent if recurs.   Spider veins of both lower extremities Right Lower Leg - Anterior  Patient good candidate for sclerotherapy. Will schedule after having fillers.   Patient advised to purchase thigh high compression to bring to appt.   Elastosis of skin face  Recommend Restylane Defyne to  nasolabial and marionette, 2 syringes of Voluma to mid face.  Will plan to start with 1 syringe of Voluma and 1 syringe of Defyne.     Return in about 1 month (around 03/19/2021) for fillers .  Graciella Belton, RMA, am acting as scribe for Forest Gleason, MD . Documentation: I have reviewed the above documentation for accuracy and completeness, and I agree with the above.  Forest Gleason, MD

## 2021-02-19 NOTE — Telephone Encounter (Signed)
Pt left v/m that she just finished prednisone and on 02/18/21 was pts best day she has had and she felt normal. 02/19/21 neck and shoulder are starting to hurt; pts husband wants to know if should get massage therapy or see a chiropractor. Pt request cb after Dr Diona Browner reviews the note. Sending to Dr Diona Browner and Butch Penny CMA.

## 2021-02-19 NOTE — Telephone Encounter (Signed)
Will repeat longer course of prednsione... if pain not resolving.. will refer to ortho. Can do massage but I recommend against chiropractor for this issue.

## 2021-02-19 NOTE — Telephone Encounter (Signed)
Meghan Kerr notified as instructed by telephone.  Patient states understanding.

## 2021-02-23 ENCOUNTER — Encounter: Payer: Self-pay | Admitting: Dermatology

## 2021-02-25 ENCOUNTER — Telehealth: Payer: Self-pay | Admitting: *Deleted

## 2021-02-25 ENCOUNTER — Other Ambulatory Visit: Payer: Self-pay | Admitting: Family Medicine

## 2021-02-25 DIAGNOSIS — M5412 Radiculopathy, cervical region: Secondary | ICD-10-CM

## 2021-02-25 DIAGNOSIS — M79602 Pain in left arm: Secondary | ICD-10-CM

## 2021-02-25 NOTE — Telephone Encounter (Signed)
No MD preference.  She does prefer [facility].

## 2021-02-25 NOTE — Telephone Encounter (Signed)
Patient called stating that she has been seeing Dr. Diona Browner for left shoulder pain. Patient stated that she felt that the Prednisone was helping and was doing better. Patient stated that the pain has gotten worse this morning in her shoulder and left elbow. Patient stated that she is overall doing better but is afraid that she is having a set back. Patient stated that she only has 2 Prednisone pills left. Patient stated that she is taking tylenol every 2 hours. Patient stated when she was in Dr. Diona Browner had mentioned a possible referral to an orthopedist and is wondering if she should go ahead and see a specialist. 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 Patient wants to know what the next step would be.

## 2021-02-25 NOTE — Telephone Encounter (Signed)
Yes.. we can do a ortho referral.. location  or MD preference?

## 2021-03-04 DIAGNOSIS — M5030 Other cervical disc degeneration, unspecified cervical region: Secondary | ICD-10-CM | POA: Diagnosis not present

## 2021-03-04 DIAGNOSIS — M5412 Radiculopathy, cervical region: Secondary | ICD-10-CM | POA: Diagnosis not present

## 2021-03-04 DIAGNOSIS — G959 Disease of spinal cord, unspecified: Secondary | ICD-10-CM | POA: Diagnosis not present

## 2021-03-11 DIAGNOSIS — H35051 Retinal neovascularization, unspecified, right eye: Secondary | ICD-10-CM | POA: Diagnosis not present

## 2021-03-17 DIAGNOSIS — M5412 Radiculopathy, cervical region: Secondary | ICD-10-CM | POA: Diagnosis not present

## 2021-03-18 ENCOUNTER — Other Ambulatory Visit: Payer: Self-pay

## 2021-03-18 ENCOUNTER — Ambulatory Visit: Payer: Medicare PPO | Admitting: Dermatology

## 2021-03-18 ENCOUNTER — Ambulatory Visit (INDEPENDENT_AMBULATORY_CARE_PROVIDER_SITE_OTHER): Payer: Self-pay | Admitting: Dermatology

## 2021-03-18 ENCOUNTER — Encounter: Payer: Self-pay | Admitting: Dermatology

## 2021-03-18 DIAGNOSIS — L988 Other specified disorders of the skin and subcutaneous tissue: Secondary | ICD-10-CM

## 2021-03-18 NOTE — Patient Instructions (Signed)

## 2021-03-18 NOTE — Progress Notes (Signed)
   Follow-Up Visit   Subjective  Meghan Kerr is a 70 y.o. female who presents for the following: Procedure (Patient here today for fillers. ).   The following portions of the chart were reviewed this encounter and updated as appropriate:   Tobacco  Allergies  Meds  Problems  Med Hx  Surg Hx  Fam Hx      Review of Systems:  No other skin or systemic complaints except as noted in HPI or Assessment and Plan.  Objective  Well appearing patient in no apparent distress; mood and affect are within normal limits.  A focused examination was performed including face. Relevant physical exam findings are noted in the Assessment and Plan.  Objective  Head - Anterior (Face): Rhytides and volume loss.   Images                   Assessment & Plan  Elastosis of skin Head - Anterior (Face)  Consider adding a syringe to mid face   Filling material injection - Head - Anterior (Face) Prior to the procedure, the patient's past medical history, allergies and the rare but potential risks and complications were reviewed with the patient and a signed consent was obtained. Pre and post-treatment care was discussed and instructions provided.  Location: cheeks  Filler Type: Juvederm Voluma  Procedure: The area was prepped thoroughly with hibiclens A cannula was used to inject the filler. Insertion sites were prepped with hibiclens and lidocaine was injected to achieve good local anesthesia. A 23-gauge needle was used to create an opening at the insertion site and then the cannula was inserted using sterile technique. Prior to injecting filler at the desired location, the syringe plunger was drawn back to ensure there was no flash of blood in order to minimize risk of intravascular injection and vascular occlusion.  After injection of the filler, the treated areas were cleansed and iced to reduce swelling. Post-treatment instructions were reviewed with the patient.       Patient  tolerated the procedure well. The patient will call with any problems, questions or concerns prior to their next appointment.   Filling material injection - Head - Anterior (Face) Prior to the procedure, the patient's past medical history, allergies and the rare but potential risks and complications were reviewed with the patient and a signed consent was obtained. Pre and post-treatment care was discussed and instructions provided.  Location: nasolabial folds and jaws  Filler Type: Restylane defyne  Procedure: The area was prepped thoroughly with hibiclens After introducing the needle into the desired treatment area, the syringe plunger was drawn back to ensure there was no flash of blood prior to injecting the filler in order to minimize risk of intravascular injection and vascular occlusion. After injection of the filler, the treated areas were cleansed and iced to reduce swelling. Post-treatment instructions were reviewed with the patient.       Patient tolerated the procedure well. The patient will call with any problems, questions or concerns prior to their next appointment.   Return if symptoms worsen or fail to improve.  Graciella Belton, RMA, am acting as scribe for Forest Gleason, MD .  Documentation: I have reviewed the above documentation for accuracy and completeness, and I agree with the above.  Forest Gleason, MD

## 2021-03-24 ENCOUNTER — Ambulatory Visit: Payer: Medicare PPO | Admitting: Dermatology

## 2021-04-03 DIAGNOSIS — M5412 Radiculopathy, cervical region: Secondary | ICD-10-CM | POA: Diagnosis not present

## 2021-04-06 ENCOUNTER — Telehealth: Payer: Self-pay

## 2021-04-06 NOTE — Telephone Encounter (Signed)
Patient called and stated that she recently had an MRI done and she was referred to Dr. Arbie Cookey who she had an appointment with on Friday (4/8). Patient stated that Dr. Arbie Cookey did not take the time to explain the results to her and told her to follow up with her PCP. Instructed patient that she should call his office and speak with the nurse to help answer her questions. Patient stated that she wanted to make an appointment with Dr. Diona Browner because she trusted her. Appointment scheduled with Dr. Diona Browner on 4/22 at 8:20. Patient stated that she will also call Dr. Larwance Sachs office.

## 2021-04-09 ENCOUNTER — Other Ambulatory Visit: Payer: Self-pay | Admitting: Family Medicine

## 2021-04-09 NOTE — Telephone Encounter (Signed)
Noted  

## 2021-04-13 NOTE — Telephone Encounter (Signed)
Last office visit 02/13/2021 for Arm/Neck Pain.  Last refilled 01/08/2021 for 30 g with no refills.  Next Appt: 04/17/2021 for questions about MRI.

## 2021-04-17 ENCOUNTER — Other Ambulatory Visit: Payer: Self-pay

## 2021-04-17 ENCOUNTER — Ambulatory Visit: Payer: Medicare PPO | Admitting: Family Medicine

## 2021-04-17 VITALS — BP 140/60 | HR 56 | Temp 97.4°F | Ht 64.5 in | Wt 169.8 lb

## 2021-04-17 DIAGNOSIS — R937 Abnormal findings on diagnostic imaging of other parts of musculoskeletal system: Secondary | ICD-10-CM | POA: Insufficient documentation

## 2021-04-17 NOTE — Progress Notes (Signed)
Patient ID: Meghan Kerr, female    DOB: Sep 14, 1951, 70 y.o.   MRN: [id number]  This visit was conducted in person.  BP 140/60   Pulse (!) 56   Temp (!) 97.4 F (36.3 C) (Temporal)   Ht 5' 4.5" (1.638 m)   Wt 169 lb 12 oz (77 kg)   SpO2 99%   BMI 28.69 kg/m    CC: Chief Complaint  Patient presents with  . Follow-up    On MRI    Subjective:   HPI: Meghan Kerr is a 70 y.o. female presenting on 04/17/2021 for Follow-up (On MRI)   She was seen at [facility] for neck pain.. 03/17/2021.  Had MRi to evaluate neck pain.   Currently the neck pain is resolved.. no further neck pain and left arm pain.  Went away on its own... only occ tingling.  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   Reviewed outside imaging. MRI showed moderate central canal stenosis.Marland Kitchen likely responsible for pain.. no indication for surgery. Pt is now asymptomatic. No further Ortho recommendations.  She has questions about MRI result comments.. showed pontine gliosis. She has no family history of neurodegenerative disease.  She denies  Abnormal memory loss, neurologic symptoms  History of several head injuries in past as a child  "Radium" treatment for hemangioma  On forehead in past.     Relevant past medical, surgical, family and social history reviewed and updated as indicated. Interim medical history since our last visit reviewed. Allergies and medications reviewed and updated. Outpatient Medications Prior to Visit  Medication Sig Dispense Refill  . amLODipine (NORVASC) 5 MG tablet TAKE 1 TABLET BY MOUTH EVERY DAY 90 tablet 1  . Apoaequorin (PREVAGEN PO) Take 1 tablet by mouth daily.    . Blood Glucose Monitoring Suppl (ONE TOUCH ULTRA MINI) w/Device KIT USE TO CHECK BLOOD SUGAR DAILY 1 each 0  . glucose blood (ONETOUCH ULTRA) test strip USE TO CHECK BLOOD SUGAR DAILY 100 each 3  . Lancets (ONETOUCH ULTRASOFT) lancets USE TO CHECK BLOOD SUGAR DAILY 100 each 3  . rosuvastatin (CRESTOR) 5  MG tablet TAKE 1 TABLET BY MOUTH EVERY OTHER DAY 45 tablet 3  . triamcinolone cream (KENALOG) 0.5 % APPLY TO AFFECTED AREA TWICE A DAY 30 g 0  . triamcinolone ointment (KENALOG) 0.1 % APPLY ON THE SKIN TWICE A DAY AS NEEDED FOR ITCH. AVOID FACE, GROIN, UNDERARMS. 454 g 1  . venlafaxine XR (EFFEXOR-XR) 75 MG 24 hr capsule TAKE 1 CAPSULE BY MOUTH EVERY DAY 90 capsule 1  . Vitamin D, Cholecalciferol, 10 MCG (400 UNIT) CHEW Chew 1 tablet by mouth daily.    . cyclobenzaprine (FLEXERIL) 10 MG tablet Take 0.5-1 tablets (5-10 mg total) by mouth at bedtime as needed for muscle spasms. 15 tablet 0  . folic acid (FOLVITE) 1 MG tablet Take 1 mg by mouth daily.     . methotrexate (RHEUMATREX) 2.5 MG tablet Take 5 mg by mouth once a week.    . predniSONE (DELTASONE) 20 MG tablet 2 tabs by mouth daily x 5 days, then 1 tabs by mouth daily x 5 days 15 tablet 0   No facility-administered medications prior to visit.     Per HPI unless specifically indicated in ROS section below Review of Systems  Constitutional: Negative for fatigue and fever.  HENT: Negative for congestion.   Eyes: Negative for pain.  Respiratory: Negative for cough and shortness of breath.   Cardiovascular: Negative for  chest pain, palpitations and leg swelling.  Gastrointestinal: Negative for abdominal pain.  Genitourinary: Negative for dysuria and vaginal bleeding.  Musculoskeletal: Negative for back pain.  Neurological: Negative for syncope, light-headedness and headaches.  Psychiatric/Behavioral: Negative for dysphoric mood.   Objective:  BP 140/60   Pulse (!) 56   Temp (!) 97.4 F (36.3 C) (Temporal)   Ht 5' 4.5" (1.638 m)   Wt 169 lb 12 oz (77 kg)   SpO2 99%   BMI 28.69 kg/m   Wt Readings from Last 3 Encounters:  04/17/21 169 lb 12 oz (77 kg)  02/13/21 165 lb 12 oz (75.2 kg)  02/06/21 165 lb 12 oz (75.2 kg)      Physical Exam Constitutional:      General: She is not in acute distress.    Appearance: Normal  appearance. She is well-developed. She is not ill-appearing or toxic-appearing.  HENT:     Head: Normocephalic.     Right Ear: Hearing, tympanic membrane, ear canal and external ear normal. Tympanic membrane is not erythematous, retracted or bulging.     Left Ear: Hearing, tympanic membrane, ear canal and external ear normal. Tympanic membrane is not erythematous, retracted or bulging.     Nose: No mucosal edema or rhinorrhea.     Right Sinus: No maxillary sinus tenderness or frontal sinus tenderness.     Left Sinus: No maxillary sinus tenderness or frontal sinus tenderness.     Mouth/Throat:     Pharynx: Uvula midline.  Eyes:     General: Lids are normal. Lids are everted, no foreign bodies appreciated.     Conjunctiva/sclera: Conjunctivae normal.     Pupils: Pupils are equal, round, and reactive to light.  Neck:     Thyroid: No thyroid mass or thyromegaly.     Vascular: No carotid bruit.     Trachea: Trachea normal.  Cardiovascular:     Rate and Rhythm: Normal rate and regular rhythm.     Pulses: Normal pulses.     Heart sounds: Normal heart sounds, S1 normal and S2 normal. No murmur heard. No friction rub. No gallop.   Pulmonary:     Effort: Pulmonary effort is normal. No tachypnea or respiratory distress.     Breath sounds: Normal breath sounds. No decreased breath sounds, wheezing, rhonchi or rales.  Abdominal:     General: Bowel sounds are normal.     Palpations: Abdomen is soft.     Tenderness: There is no abdominal tenderness.  Musculoskeletal:     Cervical back: Normal range of motion and neck supple.  Skin:    General: Skin is warm and dry.     Findings: No rash.  Neurological:     Mental Status: She is alert and oriented to person, place, and time.     GCS: GCS eye subscore is 4. GCS verbal subscore is 5. GCS motor subscore is 6.     Cranial Nerves: No cranial nerve deficit.     Sensory: No sensory deficit.     Motor: No abnormal muscle tone.     Coordination:  Coordination normal.     Gait: Gait normal.     Deep Tendon Reflexes: Reflexes are normal and symmetric.     Comments: Nml cerebellar exam   No papilledema  Psychiatric:        Mood and Affect: Mood is not anxious or depressed.        Speech: Speech normal.        Behavior:  Behavior normal. Behavior is cooperative.        Thought Content: Thought content normal.        Cognition and Memory: Memory is not impaired. She does not exhibit impaired recent memory or impaired remote memory.        Judgment: Judgment normal.       Results for orders placed or performed in visit on 02/06/21  Sedimentation Rate  Result Value Ref Range   Sed Rate 5 0 - 30 mm/hr  Vitamin B12  Result Value Ref Range   Vitamin B-12 197 (L) 211 - 911 pg/mL  TSH  Result Value Ref Range   TSH 0.89 0.35 - 4.50 uIU/mL    This visit occurred during the SARS-CoV-2 public health emergency.  Safety protocols were in place, including screening questions prior to the visit, additional usage of staff PPE, and extensive cleaning of exam room while observing appropriate contact time as indicated for disinfecting solutions.   COVID 19 screen:  No recent travel or known exposure to COVID19 The patient denies respiratory symptoms of COVID 19 at this time. The importance of social distancing was discussed today.   Assessment and Plan Problem List Items Addressed This Visit    Abnormal MRI, cervical spine - Primary    Hx of head injuries as child, history of " radium" treatment for hemangioma as a child. New finding of pontine gliosis on cervical MRI.  Will eval further with MRI brain. Normal neuro exam.      Relevant Orders   MR Brain W Wo Contrast         Eliezer Lofts, MD

## 2021-04-17 NOTE — Patient Instructions (Signed)
We will work on setting up the MRI brain for you

## 2021-04-17 NOTE — Assessment & Plan Note (Signed)
Hx of head injuries as child, history of " radium" treatment for hemangioma as a child. New finding of pontine gliosis on cervical MRI.  Will eval further with MRI brain. Normal neuro exam.

## 2021-04-22 DIAGNOSIS — M79602 Pain in left arm: Secondary | ICD-10-CM | POA: Insufficient documentation

## 2021-04-22 NOTE — Assessment & Plan Note (Signed)
Likely cervical radiculopathy causing arm pain.  Eval with X-ray of cervical spine and left shoulder eval given some pain  Subacromially and decreased ROM shoulder.  Treat with ice, prednisone taper and muscle relaxant.

## 2021-05-13 DIAGNOSIS — H35051 Retinal neovascularization, unspecified, right eye: Secondary | ICD-10-CM | POA: Diagnosis not present

## 2021-06-04 ENCOUNTER — Ambulatory Visit
Admission: RE | Admit: 2021-06-04 | Discharge: 2021-06-04 | Disposition: A | Discharge status: Home / Self Care | Payer: Medicare PPO | Source: Ambulatory Visit | Attending: Family Medicine | Admitting: Family Medicine

## 2021-06-04 ENCOUNTER — Other Ambulatory Visit: Payer: Self-pay

## 2021-06-04 DIAGNOSIS — R937 Abnormal findings on diagnostic imaging of other parts of musculoskeletal system: Secondary | ICD-10-CM | POA: Diagnosis not present

## 2021-06-04 DIAGNOSIS — G9389 Other specified disorders of brain: Secondary | ICD-10-CM | POA: Diagnosis not present

## 2021-06-04 MED ORDER — GADOBUTROL 1 MMOL/ML IV SOLN
7.5000 mL | Freq: Once | INTRAVENOUS | Status: AC | PRN
  Administered 2021-06-04: 7.5 mL via INTRAVENOUS

## 2021-06-16 ENCOUNTER — Other Ambulatory Visit: Payer: Self-pay

## 2021-06-16 ENCOUNTER — Ambulatory Visit (INDEPENDENT_AMBULATORY_CARE_PROVIDER_SITE_OTHER): Payer: Medicare PPO | Admitting: Dermatology

## 2021-06-16 ENCOUNTER — Encounter: Payer: Self-pay | Admitting: Dermatology

## 2021-06-16 DIAGNOSIS — I83893 Varicose veins of bilateral lower extremities with other complications: Secondary | ICD-10-CM | POA: Diagnosis not present

## 2021-06-16 DIAGNOSIS — L309 Dermatitis, unspecified: Secondary | ICD-10-CM | POA: Diagnosis not present

## 2021-06-16 DIAGNOSIS — L905 Scar conditions and fibrosis of skin: Secondary | ICD-10-CM

## 2021-06-16 MED ORDER — CLOBETASOL PROPIONATE 0.05 % EX SOLN
CUTANEOUS | 0 refills | Status: DC
Start: 2021-06-16 — End: 2021-09-24

## 2021-06-16 MED ORDER — DUPIXENT 300 MG/2ML ~~LOC~~ SOSY: 300.0000 mg | PREFILLED_SYRINGE | SUBCUTANEOUS | 2 refills | Status: DC

## 2021-06-16 NOTE — Patient Instructions (Addendum)
Recommend OTC Gold Bond Rapid Relief Anti-Itch cream (pramoxine + menthol) up to 3 times per day to areas that are itchy.   Eczema Skin Care  Buy TWO 16oz jars of CeraVe moisturizing cream  [facility], [facility], [facility] (no prescription needed)  Costs about $15 per jar   Jar #1: Use as a moisturizer as needed. Can be applied to any area of the body. Use twice daily to unaffected areas.  Jar #2: Pour one 5ml bottle of clobetasol 0.05% solution into jar, mix well. Label this jar to indicate the medication has been added. Use twice daily to affected areas. Do not apply to face, groin or underarms.  Moisturizer may burn or sting initially. Try for at least 4 weeks.     If you have any questions or concerns for your doctor, please call our main line at [phone] and press option 4 to reach your doctor's medical assistant. If no one answers, please leave a voicemail as directed and we will return your call as soon as possible. Messages left after 4 pm will be answered the following business day.   You may also send [place] a message via [place]. We typically respond to MyChart messages within 1-2 business days.  For prescription refills, please ask your pharmacy to contact our office. Our fax number is [phone].  If you have an urgent issue when the clinic is closed that cannot wait until the next business day, you can page your doctor at the number below.    Please note that while we do our best to be available for urgent issues outside of office hours, we are not available 24/7.   If you have an urgent issue and are unable to reach [place], you may choose to seek medical care at your doctor's office, retail clinic, urgent care center, or emergency room.  If you have a medical emergency, please immediately call 911 or go to the emergency department.  Pager Numbers  - Dr. Nehemiah Massed: [phone]  - Dr. Laurence Ferrari: [phone]  - Dr. Nicole Kindred: [phone]  In the event of inclement weather, please  call our main line at [phone] for an update on the status of any delays or closures.  Dermatology Medication Tips: Please keep the boxes that topical medications come in in order to help keep track of the instructions about where and how to use these. Pharmacies typically print the medication instructions only on the boxes and not directly on the medication tubes.   If your medication is too expensive, please contact our office at [phone] option 4 or send [place] a message through [place].   We are unable to tell what your co-pay for medications will be in advance as this is different depending on your insurance coverage. However, we may be able to find a substitute medication at lower cost or fill out paperwork to get insurance to cover a needed medication.   If a prior authorization is required to get your medication covered by your insurance company, please allow [place] 1-2 business days to complete this process.  Drug prices often vary depending on where the prescription is filled and some pharmacies may offer cheaper prices.  The website www.goodrx.com contains coupons for medications through different pharmacies. The prices here do not account for what the cost may be with help from insurance (it may be cheaper with your insurance), but the website can give you the price if you did not use any insurance.  - You can print the associated coupon and take  it with your prescription to the pharmacy.  - You may also stop by our office during regular business hours and pick up a GoodRx coupon card.  - If you need your prescription sent electronically to a different pharmacy, notify our office through [facility] or by phone at [phone] option 4.

## 2021-06-16 NOTE — Progress Notes (Signed)
Follow-Up Visit   Subjective  Meghan Kerr is a 70 y.o. female who presents for the following: Dermatitis (Pt c/o itchy rash on her back for several years treating with Triamcinolone cream with a poor response ) and Varicose Veins (Check veins at lower legs). Pt would like a irritated scar on her lower abdomen checked.   The following portions of the chart were reviewed this encounter and updated as appropriate:   Tobacco  Allergies  Meds  Problems  Med Hx  Surg Hx  Fam Hx       Review of Systems:  No other skin or systemic complaints except as noted in HPI or Assessment and Plan.  Objective  Well appearing patient in no apparent distress; mood and affect are within normal limits.  A focused examination was performed including face,back lower legs. Relevant physical exam findings are noted in the Assessment and Plan.  lower abdomen Thickened scar with overlying erosion  Right Lower Leg - Anterior 3-4 mm light blue veins, many small purple and red veins  back Scaly excoriated erythematous papules and plaques at back   Assessment & Plan  Scar lower abdomen  Symptomatic with erosion, likely secondary to rubbing on clothing Benign-appearing scar     Start mupirocin daily to 3 times daily to erosion  Intralesional injection - lower abdomen Location: low mid abdomen    Informed Consent: Discussed risks (infection, pain, bleeding, bruising, thinning of the skin, loss of skin pigment, lack of resolution, and recurrence of lesion) and benefits of the procedure, as well as the alternatives. Informed consent was obtained. Preparation: The area was prepared a standard fashion.  Procedure Details: An intralesional injection was performed with Kenalog 3.3 mg/cc. 0.3 cc in total were injected.  Total number of injections: 4  Plan: The patient was instructed on post-op care. Recommend OTC analgesia as needed for pain.   Varicose veins of bilateral lower extremities  with other complications Right Lower Leg - Anterior  Will refer to vascular and vein to assess for reflux if indicated  Related Procedures Ambulatory referral to Vascular Surgery  Dermatitis back  Chronic condition with duration over one year. Condition is bothersome to patient. Currently flared.  Hx of Eczema since childhood. Has failed topical steroids including triamcinolone, clobetasol  Start Dupixent injection  Dupilumab (Dupixent) is a treatment given by injection for adults with moderate-to-severe atopic dermatitis. Goal is control of skin condition, not cure. It is given as 2 injections at the first dose followed by 1 injection ever 2 weeks thereafter.  Potential side effects include allergic reaction, herpes infections, injection site reactions and conjunctivitis (inflammation of the eyes).  The use of Dupixent requires long term medication management, including periodic office visits.   Eczema Skin Care  Buy TWO 16oz jars of CeraVe moisturizing cream  [facility], [facility], [facility] (no prescription needed)  Costs about $15 per jar   Jar #1: Use as a moisturizer as needed. Can be applied to any area of the body. Use twice daily to unaffected areas.  Jar #2: Pour one 39ml bottle of clobetasol 0.05% solution into jar, mix well. Label this jar to indicate the medication has been added. Use twice daily to affected areas. Do not apply to face, groin or underarms.  Moisturizer may burn or sting initially. Try for at least 4 weeks.   Avoid applying to face, groin, and axilla. Use as directed. Risk of skin atrophy with long-term use reviewed.   Topical steroids (such as triamcinolone,  fluocinolone, fluocinonide, mometasone, clobetasol, halobetasol, betamethasone, hydrocortisone) can cause thinning and lightening of the skin if they are used for too long in the same area. Your physician has selected the right strength medicine for your problem and area affected on the body. Please use  your medication only as directed by your physician to prevent side effects.    Recommend OTC Gold Bond Rapid Relief Anti-Itch cream (pramoxine + menthol) up to 3 times per day to areas that are itchy.   Related Medications clobetasol (TEMOVATE) 0.05 % external solution apply twice daily as needed to affected areas for two weeks then apply twice daily on weekends only, Avoid applying to face, groin, and axilla. Use as directed. Risk of skin atrophy with long-term use reviewed.  Return in about 3 months (around 09/16/2021) for dermatitis.  I, Marye Round, CMA, am acting as scribe for Forest Gleason, MD .   Documentation: I have reviewed the above documentation for accuracy and completeness, and I agree with the above.  Forest Gleason, MD

## 2021-06-30 ENCOUNTER — Other Ambulatory Visit: Payer: Self-pay | Admitting: Family Medicine

## 2021-06-30 DIAGNOSIS — Z1231 Encounter for screening mammogram for malignant neoplasm of breast: Secondary | ICD-10-CM

## 2021-07-08 DIAGNOSIS — H35051 Retinal neovascularization, unspecified, right eye: Secondary | ICD-10-CM | POA: Diagnosis not present

## 2021-07-15 ENCOUNTER — Ambulatory Visit
Admission: RE | Admit: 2021-07-15 | Discharge: 2021-07-15 | Disposition: A | Discharge status: Home / Self Care | Payer: Medicare PPO | Source: Ambulatory Visit | Attending: Family Medicine | Admitting: Family Medicine

## 2021-07-15 ENCOUNTER — Other Ambulatory Visit: Payer: Self-pay

## 2021-07-15 DIAGNOSIS — Z1231 Encounter for screening mammogram for malignant neoplasm of breast: Secondary | ICD-10-CM

## 2021-07-16 ENCOUNTER — Telehealth: Payer: Self-pay

## 2021-07-20 ENCOUNTER — Ambulatory Visit (INDEPENDENT_AMBULATORY_CARE_PROVIDER_SITE_OTHER): Payer: Medicare PPO

## 2021-07-20 ENCOUNTER — Ambulatory Visit: Payer: Medicare PPO

## 2021-07-20 DIAGNOSIS — Z0000 Encounter for general adult medical examination without abnormal findings: Secondary | ICD-10-CM

## 2021-07-20 NOTE — Patient Instructions (Signed)
[name] , Thank you for taking time to come for your Medicare Wellness Visit. I appreciate your ongoing commitment to your health goals. Please review the following plan we discussed and let me know if I can assist you in the future.   Screening recommendations/referrals: Colonoscopy: Cologuard completed 08/13/2020, due 07/2023 Mammogram: Up to date, completed 07/15/2021, due 06/2022 Bone Density: Up to date, completed 09/06/2018, due 2-5 years  Recommended yearly ophthalmology/optometry visit for glaucoma screening and checkup Recommended yearly dental visit for hygiene and checkup  Vaccinations: Influenza vaccine: due Fall 2022 Pneumococcal vaccine: Completed series Tdap vaccine: decline-insurance Shingles vaccine: due, check with your insurance regarding coverage if interested    Covid-19:Completed series  Advanced directives: Advance directive discussed with you today. Please call our office should you change your mind and we can give you the proper paperwork for you to fill out.  Conditions/risks identified: diabetes, hypertension, hyperlipidemia   Next appointment: Follow up in one year for your annual wellness visit    Preventive Care 72 Years and Older, Female Preventive care refers to lifestyle choices and visits with your health care provider that can promote health and wellness. What does preventive care include? A yearly physical exam. This is also called an annual well check. Dental exams once or twice a year. Routine eye exams. Ask your health care provider how often you should have your eyes checked. Personal lifestyle choices, including: Daily care of your teeth and gums. Regular physical activity. Eating a healthy diet. Avoiding tobacco and drug use. Limiting alcohol use. Practicing safe sex. Taking low-dose aspirin every day. Taking vitamin and mineral supplements as recommended by your health care provider. What happens during an annual well check? The  services and screenings done by your health care provider during your annual well check will depend on your age, overall health, lifestyle risk factors, and family history of disease. Counseling  Your health care provider may ask you questions about your: Alcohol use. Tobacco use. Drug use. Emotional well-being. Home and relationship well-being. Sexual activity. Eating habits. History of falls. Memory and ability to understand (cognition). Work and work Statistician. Reproductive health. Screening  You may have the following tests or measurements: Height, weight, and BMI. Blood pressure. Lipid and cholesterol levels. These may be checked every 5 years, or more frequently if you are over 62 years old. Skin check. Lung cancer screening. You may have this screening every year starting at age 73 if you have a 30-pack-year history of smoking and currently smoke or have quit within the past 15 years. Fecal occult blood test (FOBT) of the stool. You may have this test every year starting at age 58. Flexible sigmoidoscopy or colonoscopy. You may have a sigmoidoscopy every 5 years or a colonoscopy every 10 years starting at age 17. Hepatitis C blood test. Hepatitis B blood test. Sexually transmitted disease (STD) testing. Diabetes screening. This is done by checking your blood sugar (glucose) after you have not eaten for a while (fasting). You may have this done every 1-3 years. Bone density scan. This is done to screen for osteoporosis. You may have this done starting at age 2. Mammogram. This may be done every 1-2 years. Talk to your health care provider about how often you should have regular mammograms. Talk with your health care provider about your test results, treatment options, and if necessary, the need for more tests. Vaccines  Your health care provider may recommend certain vaccines, such as: Influenza vaccine. This is recommended every year.  Tetanus, diphtheria, and acellular  pertussis (Tdap, Td) vaccine. You may need a Td booster every 10 years. Zoster vaccine. You may need this after age 41. Pneumococcal 13-valent conjugate (PCV13) vaccine. One dose is recommended after age 22. Pneumococcal polysaccharide (PPSV23) vaccine. One dose is recommended after age 20. Talk to your health care provider about which screenings and vaccines you need and how often you need them. This information is not intended to replace advice given to you by your health care provider. Make sure you discuss any questions you have with your health care provider. Document Released: 01/09/2016 Document Revised: 09/01/2016 Document Reviewed: 10/14/2015 Elsevier Interactive Patient Education  2017 [employer] Prevention in the Home Falls can cause injuries. They can happen to people of all ages. There are many things you can do to make your home safe and to help prevent falls. What can I do on the outside of my home? Regularly fix the edges of walkways and driveways and fix any cracks. Remove anything that might make you trip as you walk through a door, such as a raised step or threshold. Trim any bushes or trees on the path to your home. Use bright outdoor lighting. Clear any walking paths of anything that might make someone trip, such as rocks or tools. Regularly check to see if handrails are loose or broken. Make sure that both sides of any steps have handrails. Any raised decks and porches should have guardrails on the edges. Have any leaves, snow, or ice cleared regularly. Use sand or salt on walking paths during winter. Clean up any spills in your garage right away. This includes oil or grease spills. What can I do in the bathroom? Use night lights. Install grab bars by the toilet and in the tub and shower. Do not use towel bars as grab bars. Use non-skid mats or decals in the tub or shower. If you need to sit down in the shower, use a plastic, non-slip stool. Keep the floor  dry. Clean up any water that spills on the floor as soon as it happens. Remove soap buildup in the tub or shower regularly. Attach bath mats securely with double-sided non-slip rug tape. Do not have throw rugs and other things on the floor that can make you trip. What can I do in the bedroom? Use night lights. Make sure that you have a light by your bed that is easy to reach. Do not use any sheets or blankets that are too big for your bed. They should not hang down onto the floor. Have a firm chair that has side arms. You can use this for support while you get dressed. Do not have throw rugs and other things on the floor that can make you trip. What can I do in the kitchen? Clean up any spills right away. Avoid walking on wet floors. Keep items that you use a lot in easy-to-reach places. If you need to reach something above you, use a strong step stool that has a grab bar. Keep electrical cords out of the way. Do not use floor polish or wax that makes floors slippery. If you must use wax, use non-skid floor wax. Do not have throw rugs and other things on the floor that can make you trip. What can I do with my stairs? Do not leave any items on the stairs. Make sure that there are handrails on both sides of the stairs and use them. Fix handrails that are broken or loose.  Make sure that handrails are as long as the stairways. Check any carpeting to make sure that it is firmly attached to the stairs. Fix any carpet that is loose or worn. Avoid having throw rugs at the top or bottom of the stairs. If you do have throw rugs, attach them to the floor with carpet tape. Make sure that you have a light switch at the top of the stairs and the bottom of the stairs. If you do not have them, ask someone to add them for you. What else can I do to help prevent falls? Wear shoes that: Do not have high heels. Have rubber bottoms. Are comfortable and fit you well. Are closed at the toe. Do not wear  sandals. If you use a stepladder: Make sure that it is fully opened. Do not climb a closed stepladder. Make sure that both sides of the stepladder are locked into place. Ask someone to hold it for you, if possible. Clearly mark and make sure that you can see: Any grab bars or handrails. First and last steps. Where the edge of each step is. Use tools that help you move around (mobility aids) if they are needed. These include: Canes. Walkers. Scooters. Crutches. Turn on the lights when you go into a dark area. Replace any light bulbs as soon as they burn out. Set up your furniture so you have a clear path. Avoid moving your furniture around. If any of your floors are uneven, fix them. If there are any pets around you, be aware of where they are. Review your medicines with your doctor. Some medicines can make you feel dizzy. This can increase your chance of falling. Ask your doctor what other things that you can do to help prevent falls. This information is not intended to replace advice given to you by your health care provider. Make sure you discuss any questions you have with your health care provider. Document Released: 10/09/2009 Document Revised: 05/20/2016 Document Reviewed: 01/17/2015 Elsevier Interactive Patient Education  2017 [employer].

## 2021-07-20 NOTE — Progress Notes (Signed)
Subjective:   Meghan Kerr is a 70 y.o. female who presents for Medicare Annual (Subsequent) preventive examination.  Review of Systems: N/A     I connected with the patient today by telephone and verified that I am speaking with the correct person using two identifiers. Location patient: home Location nurse: work Persons participating in the telephone visit: patient, nurse.   I discussed the limitations, risks, security and privacy concerns of performing an evaluation and management service by telephone and the availability of in person appointments. I also discussed with the patient that there may be a patient responsible charge related to this service. The patient expressed understanding and verbally consented to this telephonic visit.        Cardiac Risk Factors include: advanced age (>11mn, >>52women);diabetes mellitus;hypertension;Other (see comment), Risk factor comments: hyperlipidemia     Objective:    Today's Vitals   There is no height or weight on file to calculate BMI.  Advanced Directives 07/20/2021 07/17/2020 06/20/2019 06/08/2018 12/03/2016  Does Patient Have a Medical Advance Directive? No No No No Yes  Type of Advance Directive - - (No Data) - HPress photographerLiving will  Does patient want to make changes to medical advance directive? - - No - Patient declined - -  Copy of HPanolain Chart? - - - - No - copy requested  Would patient like information on creating a medical advance directive? No - Patient declined Yes (MAU/Ambulatory/Procedural Areas - Information given) No - Patient declined Yes (MAU/Ambulatory/Procedural Areas - Information given) -    Current Medications (verified) Outpatient Encounter Medications as of 07/20/2021  Medication Sig   amLODipine (NORVASC) 5 MG tablet TAKE 1 TABLET BY MOUTH EVERY DAY   Apoaequorin (PREVAGEN PO) Take 1 tablet by mouth daily.   Blood Glucose Monitoring Suppl (ONE TOUCH ULTRA MINI)  w/Device KIT USE TO CHECK BLOOD SUGAR DAILY   clobetasol (TEMOVATE) 0.05 % external solution apply twice daily as needed to affected areas for two weeks then apply twice daily on weekends only, Avoid applying to face, groin, and axilla. Use as directed. Risk of skin atrophy with long-term use reviewed.   dupilumab (DUPIXENT) 300 MG/2ML prefilled syringe Inject 300 mg into the skin every 14 (fourteen) days. Starting at day 15 for maintenance.   glucose blood (ONETOUCH ULTRA) test strip USE TO CHECK BLOOD SUGAR DAILY   Lancets (ONETOUCH ULTRASOFT) lancets USE TO CHECK BLOOD SUGAR DAILY   rosuvastatin (CRESTOR) 5 MG tablet TAKE 1 TABLET BY MOUTH EVERY OTHER DAY   triamcinolone cream (KENALOG) 0.5 % APPLY TO AFFECTED AREA TWICE A DAY   triamcinolone ointment (KENALOG) 0.1 % APPLY ON THE SKIN TWICE A DAY AS NEEDED FOR ITCH. AVOID FACE, GROIN, UNDERARMS.   venlafaxine XR (EFFEXOR-XR) 75 MG 24 hr capsule TAKE 1 CAPSULE BY MOUTH EVERY DAY   Vitamin D, Cholecalciferol, 10 MCG (400 UNIT) CHEW Chew 1 tablet by mouth daily.   No facility-administered encounter medications on file as of 07/20/2021.    Allergies (verified) Amoxicillin, Sulfa antibiotics, and Sulfonamide derivatives   History: Past Medical History:  Diagnosis Date   Hyperlipidemia    Hypertension    Past Surgical History:  Procedure Laterality Date   APPENDECTOMY     ECTOPIC PREGNANCY SURGERY  1976   HAMMER TOE SURGERY Left 08/19/2017   hardware removed April 2019   TRIGGER FINGER RELEASE  2006   WRIST FRACTURE SURGERY     broken left wrist repair  with plate   Family History  Problem Relation Age of Onset   Diabetes Mother    Heart disease Mother        cad and chf   Heart disease Father        heart attack (massive)   Cancer Maternal Grandmother        COLON   Stroke Maternal Grandfather    Diabetes Brother    Heart disease Brother 88       hert attack   Heart disease Brother 24       CABG    Stroke Sister    Heart  disease Sister        VALVE REPLACEMENT   Breast cancer Sister    Breast cancer Cousin 77   Social History   Socioeconomic History   Marital status: Married    Spouse name: Not on file   Number of children: Not on file   Years of education: Not on file   Highest education level: Not on file  Occupational History   Occupation: TEACHER    Comment: Kindergarten  Tobacco Use   Smoking status: Never   Smokeless tobacco: Never  Vaping Use   Vaping Use: Never used  Substance and Sexual Activity   Alcohol use: No   Drug use: No   Sexual activity: Not Currently  Other Topics Concern   Not on file  Social History Narrative   Not on file   Social Determinants of Health   Financial Resource Strain: Low Risk    Difficulty of Paying Living Expenses: Not hard at all  Food Insecurity: No Food Insecurity   Worried About Charity fundraiser in the Last Year: Never true   [place] in the Last Year: Never true  Transportation Needs: No Transportation Needs   Lack of Transportation (Medical): No   Lack of Transportation (Non-Medical): No  Physical Activity: Inactive   Days of Exercise per Week: 0 days   Minutes of Exercise per Session: 0 min  Stress: No Stress Concern Present   Feeling of Stress : Not at all  Social Connections: Not on file    Tobacco Counseling Counseling given: Not Answered   Clinical Intake:  Pre-visit preparation completed: Yes  Pain : No/denies pain     Nutritional Risks: None Diabetes: Yes CBG done?: No Did pt. bring in CBG monitor from home?: No  How often do you need to have someone help you when you read instructions, pamphlets, or other written materials from your doctor or pharmacy?: 1 - Never  Diabetic: Yes Nutrition Risk Assessment:  Has the patient had any N/V/D within the last 2 months?  No  Does the patient have any non-healing wounds?  No  Has the patient had any unintentional weight loss or weight gain?  No    Diabetes:  Is the patient diabetic?  Yes  If diabetic, was a CBG obtained today?  No  telephone visit  Did the patient bring in their glucometer from home?  No  telephone visit  How often do you monitor your CBG's? 4 times a week.   Financial Strains and Diabetes Management:  Are you having any financial strains with the device, your supplies or your medication? No .  Does the patient want to be seen by Chronic Care Management for management of their diabetes?  No  Would the patient like to be referred to a Nutritionist or for Diabetic Management?  No   Diabetic Exams:  Diabetic Eye Exam: Completed 07/08/2021 Diabetic Foot Exam: Overdue, Pt has been advised about the importance in completing this exam.    Interpreter Needed?: No  Information entered by :: CJohnson, RN   Activities of Daily Living In your present state of health, do you have any difficulty performing the following activities: 07/20/2021  Hearing? N  Vision? N  Difficulty concentrating or making decisions? N  Walking or climbing stairs? N  Dressing or bathing? N  Doing errands, shopping? N  Preparing Food and eating ? N  Using the Toilet? N  In the past six months, have you accidently leaked urine? N  Do you have problems with loss of bowel control? N  Managing your Medications? N  Managing your Finances? N  Housekeeping or managing your Housekeeping? N  Some recent data might be hidden    Patient Care Team: Jinny Sanders, MD as PCP - General Edison Pace Josie Saunders, MD as Consulting Physician (Ophthalmology)  Indicate any recent Medical Services you may have received from other than [facility] providers in the past year (date may be approximate).     Assessment:   This is a routine wellness examination for Faren.  Hearing/Vision screen Vision Screening - Comments:: Patient gets annual eye exams  Dietary issues and exercise activities discussed: Current Exercise Habits: The patient does not participate  in regular exercise at present, Exercise limited by: None identified   Goals Addressed             This Visit's Progress    Patient Stated       07/20/2021, I will continue to go to a exercise class every other Wednesday for 1 hour.       Depression Screen PHQ 2/9 Scores 07/20/2021 07/17/2020 05/09/2020 04/11/2020 06/20/2019 06/08/2018 06/21/2017  PHQ - 2 Score 0 '1 2 2 ' 0 0 0  PHQ- 9 Score 0 '1 5 7 ' 0 0 -    Fall Risk Fall Risk  07/20/2021 07/17/2020 06/20/2019 06/08/2018 12/03/2016  Falls in the past year? 1 0 0 No Yes  Number falls in past yr: 1 0 - - 1  Injury with Fall? 0 0 - - No  Risk for fall due to : Medication side effect Medication side effect - - -  Follow up Falls evaluation completed;Falls prevention discussed Falls evaluation completed;Falls prevention discussed - - -    FALL RISK PREVENTION PERTAINING TO THE HOME:  Any stairs in or around the home? Yes  If so, are there any without handrails? No  Home free of loose throw rugs in walkways, pet beds, electrical cords, etc? Yes  Adequate lighting in your home to reduce risk of falls? Yes   ASSISTIVE DEVICES UTILIZED TO PREVENT FALLS:  Life alert? No  Use of a cane, walker or w/c? No  Grab bars in the bathroom? No  Shower chair or bench in shower? No  Elevated toilet seat or a handicapped toilet? No   TIMED UP AND GO:  Was the test performed?  N/A telephone visit .    Cognitive Function: MMSE - Mini Mental State Exam 07/20/2021 07/17/2020 06/20/2019 06/08/2018  Orientation to time '5 5 5 5  ' Orientation to Place '5 5 5 5  ' Registration '3 3 3 3  ' Attention/ Calculation 5 5 0 0  Recall '3 3 3 3  ' Language- name 2 objects - - 0 0  Language- repeat '1 1 1 1  ' Language- follow 3 step command - - 0 3  Language- read &  follow direction - - 0 0  Write a sentence - - 0 0  Copy design - - 0 0  Total score - - 17 20  Mini Cog  Mini-Cog screen was completed. Maximum score is 22. A value of 0 denotes this part of the MMSE was not  completed or the patient failed this part of the Mini-Cog screening.       Immunizations Immunization History  Administered Date(s) Administered   Fluad Quad(high Dose 65+) 08/22/2019   Influenza Whole 10/24/2008, 10/23/2010   Influenza,inj,Quad PF,6+ Mos 09/13/2014, 01/15/2015, 12/01/2015, 12/03/2016, 10/26/2017, 10/03/2018   PFIZER(Purple Top)SARS-COV-2 Vaccination 02/01/2020, 02/22/2020, 09/29/2020, 07/10/2021   Pneumococcal Conjugate-13 09/13/2014   Pneumococcal Polysaccharide-23 10/24/2008, 01/15/2015   Td 12/27/2005    TDAP status: Due, Education has been provided regarding the importance of this vaccine. Advised may receive this vaccine at local pharmacy or Health Dept. Aware to provide a copy of the vaccination record if obtained from local pharmacy or Health Dept. Verbalized acceptance and understanding.  Flu Vaccine status: due Fall 2022  Pneumococcal vaccine status: Up to date  Covid-19 vaccine status: Completed vaccines  Qualifies for Shingles Vaccine? Yes   Zostavax completed No   Shingrix Completed?: No.    Education has been provided regarding the importance of this vaccine. Patient has been advised to call insurance company to determine out of pocket expense if they have not yet received this vaccine. Advised may also receive vaccine at local pharmacy or Health Dept. Verbalized acceptance and understanding.  Screening Tests Health Maintenance  Topic Date Due   Zoster Vaccines- Shingrix (1 of 2) Never done   HEMOGLOBIN A1C  01/18/2021   FOOT EXAM  07/14/2021   TETANUS/TDAP  07/23/2024 (Originally 12/28/2015)   INFLUENZA VACCINE  07/27/2021   COVID-19 Vaccine (5 - Booster for Pfizer series) 11/10/2021   OPHTHALMOLOGY EXAM  07/08/2022   MAMMOGRAM  07/16/2023   Fecal DNA (Cologuard)  08/14/2023   DEXA SCAN  09/07/2023   Hepatitis C Screening  Completed   PNA vac Low Risk Adult  Completed   HPV VACCINES  Aged Out    Health Maintenance  Health Maintenance Due   Topic Date Due   Zoster Vaccines- Shingrix (1 of 2) Never done   HEMOGLOBIN A1C  01/18/2021   FOOT EXAM  07/14/2021    Colorectal cancer screening: Type of screening: Cologuard. Completed 08/13/2020. Repeat every 3 years  Mammogram status: Completed 07/15/2021. Repeat every year  Bone Density status: Completed 09/06/2018. Results reflect: Bone density results: OSTEOPENIA. Repeat every 2-5 years.  Lung Cancer Screening: (Low Dose CT Chest recommended if Age 40-80 years, 30 pack-year currently smoking OR have quit w/in 15years.) does not qualify.    Additional Screening:  Hepatitis C Screening: does qualify; Completed 11/28/2015  Vision Screening: Recommended annual ophthalmology exams for early detection of glaucoma and other disorders of the eye. Is the patient up to date with their annual eye exam?  Yes  Who is the provider or what is the name of the office in which the patient attends annual eye exams? Dr. Benay Pillow If pt is not established with a provider, would they like to be referred to a provider to establish care? No .   Dental Screening: Recommended annual dental exams for proper oral hygiene  Community Resource Referral / Chronic Care Management: CRR required this visit?  No   CCM required this visit?  No      Plan:     I have personally reviewed  and noted the following in the patient's chart:   Medical and social history Use of alcohol, tobacco or illicit drugs  Current medications and supplements including opioid prescriptions.  Functional ability and status Nutritional status Physical activity Advanced directives List of other physicians Hospitalizations, surgeries, and ER visits in previous 12 months Vitals Screenings to include cognitive, depression, and falls Referrals and appointments  In addition, I have reviewed and discussed with patient certain preventive protocols, quality metrics, and best practice recommendations. A written personalized care  plan for preventive services as well as general preventive health recommendations were provided to patient.   Due to this being a telephonic visit, the after visit summary with patients personalized plan was offered to patient via office or my-chart. Patient preferred to pick up at office at next visit or via mychart.   Andrez Grime, LPN   [id number]

## 2021-07-20 NOTE — Progress Notes (Signed)
PCP notes:  Health Maintenance: Foot exam- due Shingrix- due   Abnormal Screenings: none   Patient concerns: none   Nurse concerns: none   Next PCP appt.: none

## 2021-08-03 ENCOUNTER — Other Ambulatory Visit: Payer: Self-pay | Admitting: Family Medicine

## 2021-08-03 NOTE — Telephone Encounter (Signed)
Please schedule CPE with fasting labs prior with Dr. Diona Browner.   Looks like she has seen [place] recently for [facility].

## 2021-08-17 IMAGING — MG MM DIGITAL SCREENING BILAT W/ TOMO AND CAD
8 series · 8 of 24 positions shown · non-contrast
Comparison: Previous exam(s).

CLINICAL DATA: Screening.

EXAM:
DIGITAL SCREENING BILATERAL MAMMOGRAM WITH TOMOSYNTHESIS AND CAD
TECHNIQUE: Bilateral screening digital craniocaudal and mediolateral oblique
mammograms were obtained. Bilateral screening digital breast
tomosynthesis was performed. The images were evaluated with
computer-aided detection.

[R MLO synth-2D]
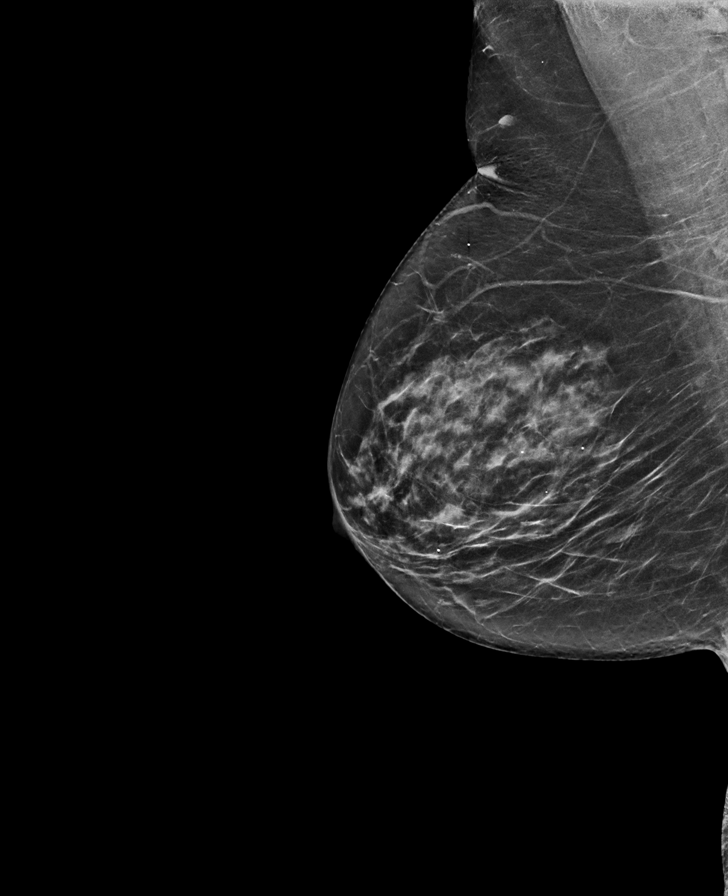

[R CC synth-2D]
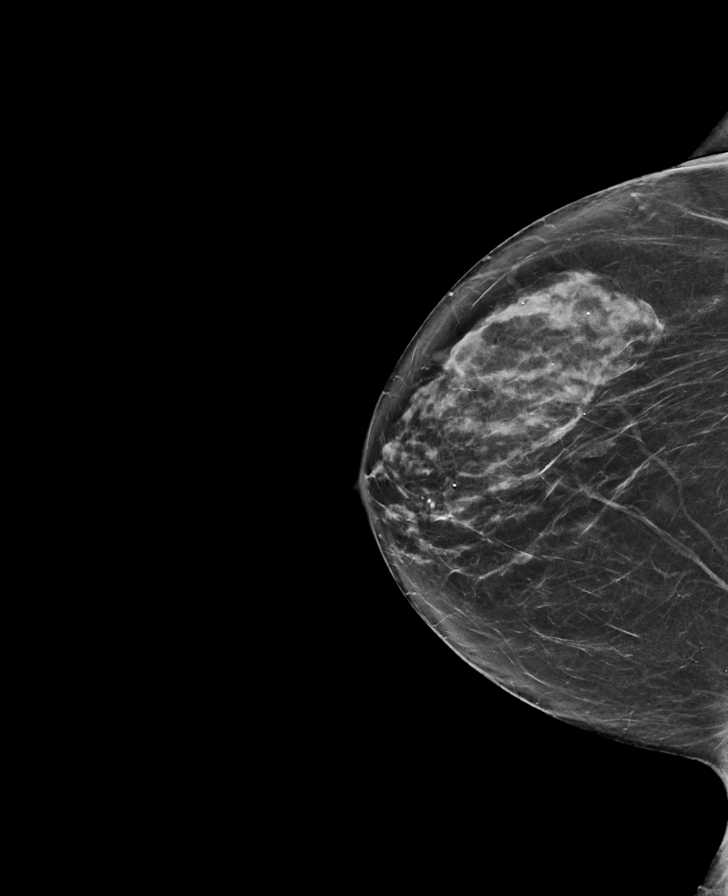

[L MLO synth-2D]
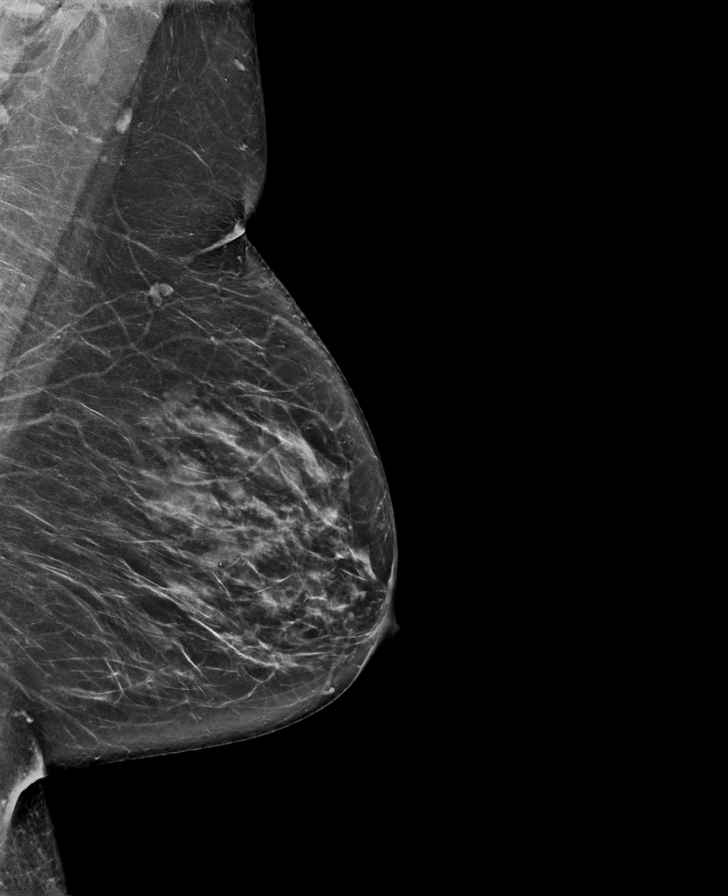

[L CC synth-2D]
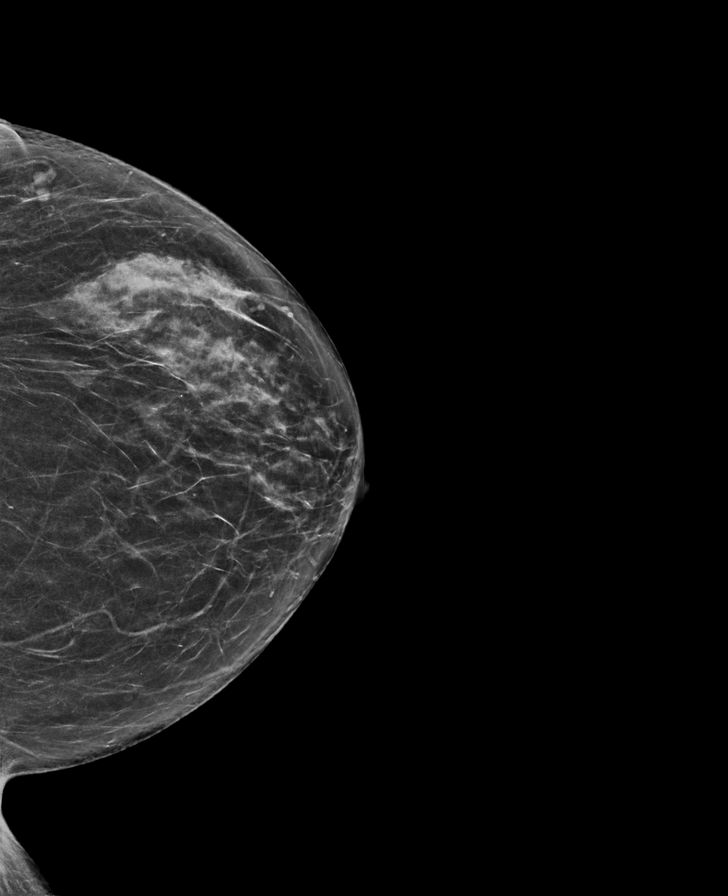

[L CC tomo · tomo slice 32/63.0]
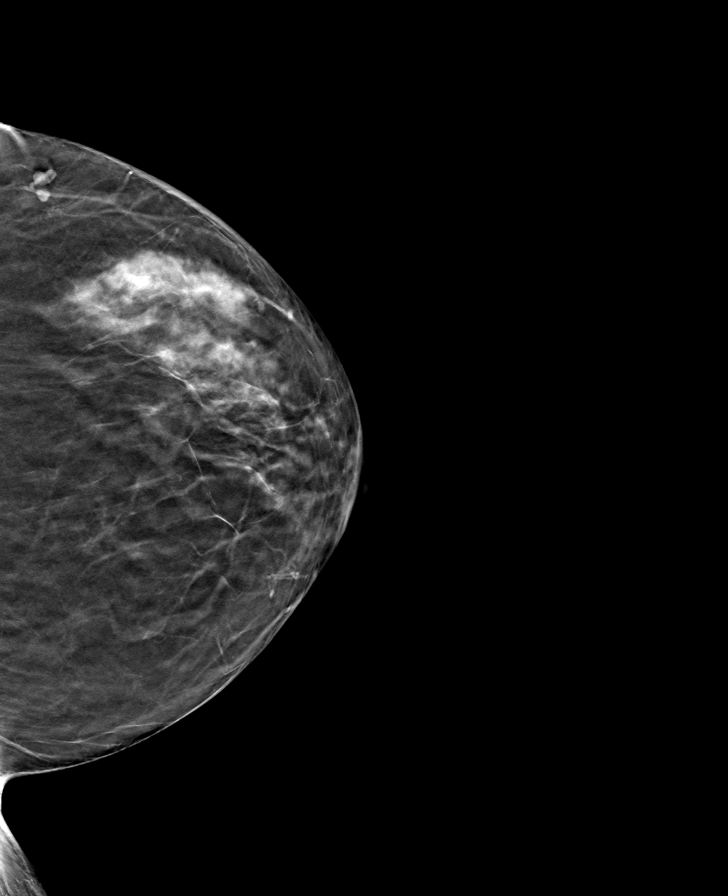

[L MLO tomo · tomo slice 37/72.0]
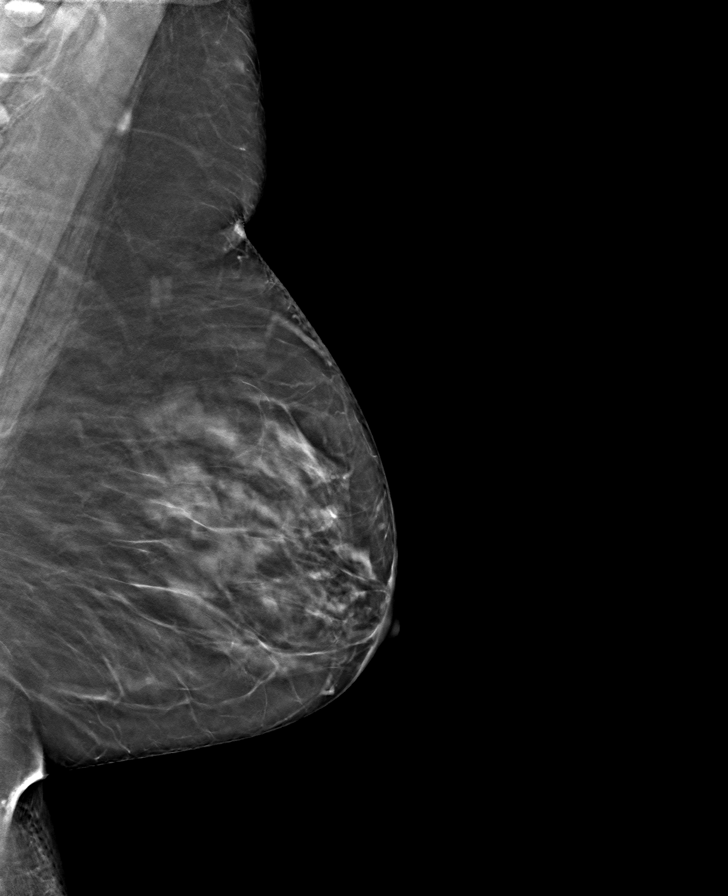

[R CC tomo · tomo slice 35/68.0]
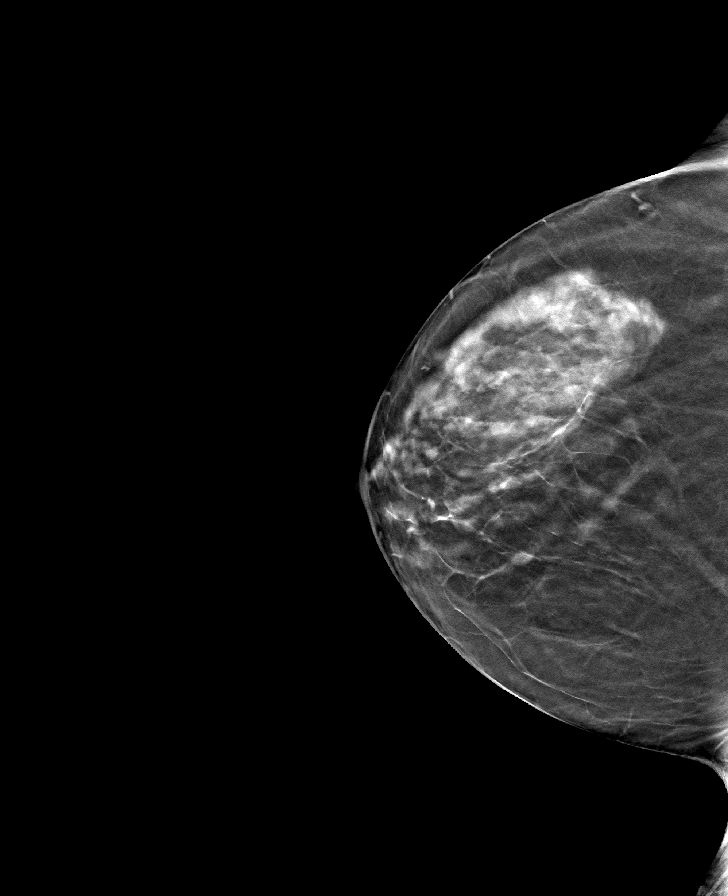

[R MLO tomo · tomo slice 34/67.0]
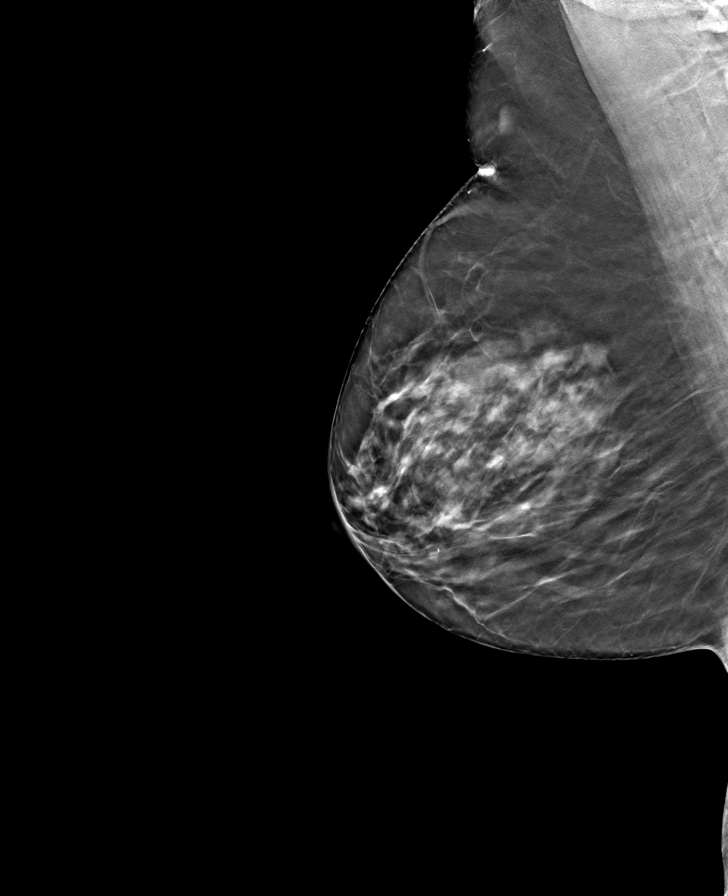

[8 of 24 positions shown; findings below may reference images not displayed]

ACR Breast Density Category c: The breast tissue is heterogeneously
dense, which may obscure small masses.
FINDINGS: There are no findings suspicious for malignancy.
IMPRESSION: No mammographic evidence of malignancy. A result letter of this
screening mammogram will be mailed directly to the patient.

RECOMMENDATION:
Screening mammogram in one year. (Code:[id number])

BI-RADS CATEGORY  1: Negative.

## 2021-08-18 ENCOUNTER — Other Ambulatory Visit: Payer: Self-pay | Admitting: Family Medicine

## 2021-08-31 LAB — HM DIABETES FOOT EXAM

## 2021-09-02 DIAGNOSIS — H35051 Retinal neovascularization, unspecified, right eye: Secondary | ICD-10-CM | POA: Diagnosis not present

## 2021-09-04 ENCOUNTER — Telehealth: Payer: Self-pay | Admitting: Family Medicine

## 2021-09-04 DIAGNOSIS — E1159 Type 2 diabetes mellitus with other circulatory complications: Secondary | ICD-10-CM

## 2021-09-04 DIAGNOSIS — E1169 Type 2 diabetes mellitus with other specified complication: Secondary | ICD-10-CM

## 2021-09-04 DIAGNOSIS — E785 Hyperlipidemia, unspecified: Secondary | ICD-10-CM

## 2021-09-04 NOTE — Telephone Encounter (Signed)
-----   Message from Ellamae Sia sent at 08/24/2021 10:24 AM EDT ----- Regarding: Lab orders for Thursday, 9.15.22 Patient is scheduled for CPX labs, please order future labs, Thanks , Karna Christmas

## 2021-09-11 ENCOUNTER — Other Ambulatory Visit: Payer: Self-pay

## 2021-09-11 ENCOUNTER — Other Ambulatory Visit: Payer: Self-pay | Admitting: Family Medicine

## 2021-09-11 ENCOUNTER — Other Ambulatory Visit (INDEPENDENT_AMBULATORY_CARE_PROVIDER_SITE_OTHER): Payer: Medicare PPO

## 2021-09-11 DIAGNOSIS — E1159 Type 2 diabetes mellitus with other circulatory complications: Secondary | ICD-10-CM | POA: Diagnosis not present

## 2021-09-11 LAB — LIPID PANEL
Cholesterol: 163 mg/dL (ref 0–200)
HDL: 63.6 mg/dL (ref >39.00)
LDL Cholesterol: 88 mg/dL (ref 0–99)
NonHDL: 99.43
Total CHOL/HDL Ratio: 3
Triglycerides: 59 mg/dL (ref 0.0–149.0)
VLDL: 11.8 mg/dL (ref 0.0–40.0)

## 2021-09-11 LAB — COMPREHENSIVE METABOLIC PANEL
ALT: 10 U/L (ref 0–35)
AST: 14 U/L (ref 0–37)
Albumin: 3.9 g/dL (ref 3.5–5.2)
Alkaline Phosphatase: 95 U/L (ref 39–117)
BUN: 12 mg/dL (ref 6–23)
CO2: 28 mEq/L (ref 19–32)
Calcium: 9.1 mg/dL (ref 8.4–10.5)
Chloride: 106 mEq/L (ref 96–112)
Creatinine, Ser: 0.74 mg/dL (ref 0.40–1.20)
GFR: 81.02 mL/min (ref >60.00)
Glucose, Bld: 107 mg/dL — ABNORMAL HIGH (ref 70–99)
Potassium: 3.9 mEq/L (ref 3.5–5.1)
Sodium: 142 mEq/L (ref 135–145)
Total Bilirubin: 0.5 mg/dL (ref 0.2–1.2)
Total Protein: 6.5 g/dL (ref 6.0–8.3)

## 2021-09-11 LAB — HEMOGLOBIN A1C: Hgb A1c MFr Bld: 6.8 % — ABNORMAL HIGH (ref 4.6–6.5)

## 2021-09-11 NOTE — Telephone Encounter (Signed)
Last office visit 04/17/2021 for abnormal MRI, cervical spine.  Last refilled 04/13/2021 for 30 g with no refills.  Next Appt: CPE 09/18/2021.

## 2021-09-18 ENCOUNTER — Other Ambulatory Visit: Payer: Self-pay

## 2021-09-18 ENCOUNTER — Encounter: Payer: Self-pay | Admitting: Family Medicine

## 2021-09-18 ENCOUNTER — Ambulatory Visit (INDEPENDENT_AMBULATORY_CARE_PROVIDER_SITE_OTHER): Payer: Medicare PPO | Admitting: Family Medicine

## 2021-09-18 VITALS — BP 140/74 | HR 61 | Temp 97.8°F | Ht 64.5 in | Wt 167.1 lb

## 2021-09-18 DIAGNOSIS — Z23 Encounter for immunization: Secondary | ICD-10-CM | POA: Diagnosis not present

## 2021-09-18 DIAGNOSIS — E1169 Type 2 diabetes mellitus with other specified complication: Secondary | ICD-10-CM | POA: Diagnosis not present

## 2021-09-18 DIAGNOSIS — E1159 Type 2 diabetes mellitus with other circulatory complications: Secondary | ICD-10-CM

## 2021-09-18 DIAGNOSIS — Z0000 Encounter for general adult medical examination without abnormal findings: Secondary | ICD-10-CM

## 2021-09-18 DIAGNOSIS — F331 Major depressive disorder, recurrent, moderate: Secondary | ICD-10-CM

## 2021-09-18 DIAGNOSIS — I10 Essential (primary) hypertension: Secondary | ICD-10-CM | POA: Diagnosis not present

## 2021-09-18 DIAGNOSIS — E785 Hyperlipidemia, unspecified: Secondary | ICD-10-CM | POA: Diagnosis not present

## 2021-09-18 DIAGNOSIS — I152 Hypertension secondary to endocrine disorders: Secondary | ICD-10-CM

## 2021-09-18 MED ORDER — VENLAFAXINE HCL ER 37.5 MG PO CP24: 37.5000 mg | ORAL_CAPSULE | Freq: Every day | ORAL | 11 refills | Status: DC

## 2021-09-18 NOTE — Progress Notes (Signed)
Patient ID: Meghan Kerr, female    DOB: 1951-03-22, 70 y.o.   MRN: [id number]  This visit was conducted in person.  BP 140/74   Pulse 61   Temp 97.8 F (36.6 C) (Temporal)   Ht 5' 4.5" (1.638 m)   Wt 167 lb 2 oz (75.8 kg)   SpO2 97%   BMI 28.24 kg/m    CC: Chief Complaint  Patient presents with   Annual Exam    Part 2    Subjective:   HPI: Meghan Kerr is a 70 y.o. female presenting on 09/18/2021 for Annual Exam (Part 2)  The patient presents for complete physical and review of chronic health problems. He/She also has the following acute concerns today: none  The patient saw a LPN or RN for medicare wellness visit.  Prevention and wellness was reviewed in detail. Note reviewed and important notes copied below. PCP notes:  Health Maintenance: Foot exam- due Shingrix- due  Abnormal Screenings: none     Hypertension: Borderline control in office today on amlodipine At home controlled. She did not take her meds this AM. BP Readings from Last 3 Encounters:  09/18/21 140/74  04/17/21 140/60  02/13/21 130/72  Using medication without problems or lightheadedness:  none Chest pain with exertion:none Edema:none Short of breath:none Average home BPs: Other issues:  Elevated Cholesterol:LDL at goal on crestor 5 mg every other day. Lab Results  Component Value Date   CHOL 163 09/11/2021   HDL 63.60 09/11/2021   LDLCALC 88 09/11/2021   LDLDIRECT 190.1 07/15/2011   TRIG 59.0 09/11/2021   CHOLHDL 3 09/11/2021  Using medications without problems: none Muscle aches:  none Diet compliance: healthy Exercise: walking 3 miles a week Other complaints:  Diabetes:  Diet controlled. Associated with HTN and hyperlipidemia Lab Results  Component Value Date   HGBA1C 6.8 (H) 09/11/2021  Using medications without difficulties: Hypoglycemic episodes: Hyperglycemic episodes: Feet problems: Blood Sugars averaging: eye exam within last year:   MDD: Stable control  on effexor 75 mg daily.. she is interested in decreasing this medication. Flowsheet Row Clinical Support from 07/20/2021 in [place] at [facility] Total Score 0        Relevant past medical, surgical, family and social history reviewed and updated as indicated. Interim medical history since our last visit reviewed. Allergies and medications reviewed and updated. Outpatient Medications Prior to Visit  Medication Sig Dispense Refill   amLODipine (NORVASC) 5 MG tablet TAKE 1 TABLET BY MOUTH EVERY DAY 90 tablet 0   Apoaequorin (PREVAGEN PO) Take 1 tablet by mouth daily.     Blood Glucose Monitoring Suppl (ONE TOUCH ULTRA MINI) w/Device KIT USE TO CHECK BLOOD SUGAR DAILY 1 each 0   clobetasol (TEMOVATE) 0.05 % external solution apply twice daily as needed to affected areas for two weeks then apply twice daily on weekends only, Avoid applying to face, groin, and axilla. Use as directed. Risk of skin atrophy with long-term use reviewed. 50 mL 0   glucose blood (ONETOUCH ULTRA) test strip USE TO CHECK BLOOD SUGAR DAILY 100 each 3   Lancets (ONETOUCH ULTRASOFT) lancets USE TO CHECK BLOOD SUGAR DAILY 100 each 3   rosuvastatin (CRESTOR) 5 MG tablet TAKE 1 TABLET BY MOUTH EVERY OTHER DAY 45 tablet 0   triamcinolone cream (KENALOG) 0.5 % APPLY TO AFFECTED AREA TWICE A DAY 30 g 0   venlafaxine XR (EFFEXOR-XR) 75 MG 24 hr capsule TAKE 1 CAPSULE BY MOUTH  EVERY DAY 90 capsule 0   Vitamin D, Cholecalciferol, 10 MCG (400 UNIT) CHEW Chew 1 tablet by mouth daily.     dupilumab (DUPIXENT) 300 MG/2ML prefilled syringe Inject 300 mg into the skin every 14 (fourteen) days. Starting at day 15 for maintenance. 4 mL 2   No facility-administered medications prior to visit.     Per HPI unless specifically indicated in ROS section below Review of Systems  Constitutional:  Negative for fatigue and fever.  HENT:  Negative for congestion.   Eyes:  Negative for pain.  Respiratory:  Negative for cough  and shortness of breath.   Cardiovascular:  Negative for chest pain, palpitations and leg swelling.  Gastrointestinal:  Negative for abdominal pain.  Genitourinary:  Negative for dysuria and vaginal bleeding.  Musculoskeletal:  Negative for back pain.  Neurological:  Negative for syncope, light-headedness and headaches.  Psychiatric/Behavioral:  Negative for dysphoric mood.   Objective:  BP 140/74   Pulse 61   Temp 97.8 F (36.6 C) (Temporal)   Ht 5' 4.5" (1.638 m)   Wt 167 lb 2 oz (75.8 kg)   SpO2 97%   BMI 28.24 kg/m   Wt Readings from Last 3 Encounters:  09/18/21 167 lb 2 oz (75.8 kg)  04/17/21 169 lb 12 oz (77 kg)  02/13/21 165 lb 12 oz (75.2 kg)      Physical Exam Vitals and nursing note reviewed.  Constitutional:      General: She is not in acute distress.    Appearance: Normal appearance. She is well-developed. She is not ill-appearing or toxic-appearing.  HENT:     Head: Normocephalic.     Right Ear: Hearing, tympanic membrane, ear canal and external ear normal.     Left Ear: Hearing, tympanic membrane, ear canal and external ear normal.     Nose: Nose normal.  Eyes:     General: Lids are normal. Lids are everted, no foreign bodies appreciated.     Conjunctiva/sclera: Conjunctivae normal.     Pupils: Pupils are equal, round, and reactive to light.  Neck:     Thyroid: No thyroid mass or thyromegaly.     Vascular: No carotid bruit.     Trachea: Trachea normal.  Cardiovascular:     Rate and Rhythm: Normal rate and regular rhythm.     Heart sounds: Normal heart sounds, S1 normal and S2 normal. No murmur heard.   No gallop.  Pulmonary:     Effort: Pulmonary effort is normal. No respiratory distress.     Breath sounds: Normal breath sounds. No wheezing, rhonchi or rales.  Abdominal:     General: Bowel sounds are normal. There is no distension or abdominal bruit.     Palpations: Abdomen is soft. There is no fluid wave or mass.     Tenderness: There is no abdominal  tenderness. There is no guarding or rebound.     Hernia: No hernia is present.  Musculoskeletal:     Cervical back: Normal range of motion and neck supple.  Lymphadenopathy:     Cervical: No cervical adenopathy.  Skin:    General: Skin is warm and dry.     Findings: No rash.  Neurological:     Mental Status: She is alert.     Cranial Nerves: No cranial nerve deficit.     Sensory: No sensory deficit.  Psychiatric:        Mood and Affect: Mood is not anxious or depressed.  Speech: Speech normal.        Behavior: Behavior normal. Behavior is cooperative.        Judgment: Judgment normal.    Diabetic foot exam: Normal inspection No skin breakdown No calluses  Normal DP pulses Normal sensation to light touch and monofilament Nails normal     Results for orders placed or performed in visit on 09/11/21  Comprehensive metabolic panel  Result Value Ref Range   Sodium 142 135 - 145 mEq/L   Potassium 3.9 3.5 - 5.1 mEq/L   Chloride 106 96 - 112 mEq/L   CO2 28 19 - 32 mEq/L   Glucose, Bld 107 (H) 70 - 99 mg/dL   BUN 12 6 - 23 mg/dL   Creatinine, Ser 0.74 0.40 - 1.20 mg/dL   Total Bilirubin 0.5 0.2 - 1.2 mg/dL   Alkaline Phosphatase 95 39 - 117 U/L   AST 14 0 - 37 U/L   ALT 10 0 - 35 U/L   Total Protein 6.5 6.0 - 8.3 g/dL   Albumin 3.9 3.5 - 5.2 g/dL   GFR 81.02 >60.00 mL/min   Calcium 9.1 8.4 - 10.5 mg/dL  Lipid panel  Result Value Ref Range   Cholesterol 163 0 - 200 mg/dL   Triglycerides 59.0 0.0 - 149.0 mg/dL   HDL 63.60 >39.00 mg/dL   VLDL 11.8 0.0 - 40.0 mg/dL   LDL Cholesterol 88 0 - 99 mg/dL   Total CHOL/HDL Ratio 3    NonHDL 99.43   Hemoglobin A1c  Result Value Ref Range   Hgb A1c MFr Bld 6.8 (H) 4.6 - 6.5 %    This visit occurred during the SARS-CoV-2 public health emergency.  Safety protocols were in place, including screening questions prior to the visit, additional usage of staff PPE, and extensive cleaning of exam room while observing appropriate  contact time as indicated for disinfecting solutions.   COVID 19 screen:  No recent travel or known exposure to COVID19 The patient denies respiratory symptoms of COVID 19 at this time. The importance of social distancing was discussed today.   Assessment and Plan   The patient's preventative maintenance and recommended screening tests for an annual wellness exam were reviewed in full today. Brought up to date unless services declined.  Counselled on the importance of diet, exercise, and its role in overall health and mortality. The patient's FH and SH was reviewed, including their home life, tobacco status, and drug and alcohol status.   Vaccines; Uptodate PNA, consider shingrix,  S/P COVID19 vaccine x4, due for flu shot.. given high dose today. DVE/pap:pap not indicated, DVE  not indicated   Colon: Date of Last Colonoscopy: 05/28/2007.. In [place] Results: Hyperplastic Polyp, recommended repeat in 5 years. She request stool ifob instead. Had ifob neg 05/2015.  NOW  Cologuard.. nml 03/2017, repeat in 2021 negative, repeat Cologuard in 2024 Mammo:06/2021 normal repeat in 1years. DEXA: osteopenia stable T-1.8 spine 09/06/2018, repeat in 5 years. Hep C: completed.  Problem List Items Addressed This Visit     Hyperlipidemia associated with type 2 diabetes mellitus ([place])    Stable, chronic.  Continue current medication.   LDL at goal on crestor 5 mg every other day.      Hypertension associated with diabetes (HCC)    Stable, chronic.  Continue current medication.   Amlodipine 5 mg daily      Major depressive disorder, recurrent episode, moderate (HCC)    Stable, chronic.   Wean down to lower dose of  venlafaxine 37.5 mg daily ( can go go slowly with 75 mg alternating with 37.5 mg).       Relevant Medications   venlafaxine XR (EFFEXOR-XR) 37.5 MG 24 hr capsule   Type 2 diabetes mellitus with other circulatory complications (HTN) (HCC)    Stable, chronic. Diet controlled.         Other Visit Diagnoses     Routine general medical examination at a health care facility    -  Primary   Need for influenza vaccination       Relevant Orders   Flu Vaccine QUAD High Dose(Fluad) (Completed)        Eliezer Lofts, MD

## 2021-09-18 NOTE — Assessment & Plan Note (Signed)
Stable, chronic.  Continue current medication.   Amlodipine 5 mg daily

## 2021-09-18 NOTE — Assessment & Plan Note (Addendum)
Stable, chronic.   Wean down to lower dose of venlafaxine 37.5 mg daily ( can go go slowly with 75 mg alternating with 37.5 mg).

## 2021-09-18 NOTE — Assessment & Plan Note (Signed)
Stable, chronic.  Continue current medication.   LDL at goal on crestor 5 mg every other day.

## 2021-09-18 NOTE — Assessment & Plan Note (Signed)
Stable, chronic. Diet controlled.

## 2021-09-18 NOTE — Patient Instructions (Addendum)
Wean down to lower dose of venlafaxine 37.5 mg daily ( can go go slowly with 75 mg alternating with 37.5 mg).

## 2021-09-24 ENCOUNTER — Other Ambulatory Visit: Payer: Self-pay

## 2021-09-24 ENCOUNTER — Encounter: Payer: Self-pay | Admitting: Dermatology

## 2021-09-24 ENCOUNTER — Ambulatory Visit: Payer: Medicare PPO | Admitting: Dermatology

## 2021-09-24 DIAGNOSIS — L905 Scar conditions and fibrosis of skin: Secondary | ICD-10-CM

## 2021-09-24 DIAGNOSIS — L309 Dermatitis, unspecified: Secondary | ICD-10-CM

## 2021-09-24 DIAGNOSIS — L988 Other specified disorders of the skin and subcutaneous tissue: Secondary | ICD-10-CM

## 2021-09-24 MED ORDER — TACROLIMUS 0.1 % EX OINT
TOPICAL_OINTMENT | Freq: Two times a day (BID) | CUTANEOUS | 0 refills | Status: DC
Start: 2021-09-24 — End: 2022-04-06

## 2021-09-24 NOTE — Progress Notes (Signed)
Follow-Up Visit   Subjective  Meghan Kerr is a 70 y.o. female who presents for the following: Follow-up (Patient here today for 3 month dermatitis follow up. Patient uses TMC and clobetasol/cerave mix, mostly at sides. ).  Patient did not want to start Dupixent injections.   The following portions of the chart were reviewed this encounter and updated as appropriate:   Tobacco  Allergies  Meds  Problems  Med Hx  Surg Hx  Fam Hx      Review of Systems:  No other skin or systemic complaints except as noted in HPI or Assessment and Plan.  Objective  Well appearing patient in no apparent distress; mood and affect are within normal limits.  A focused examination was performed including face, abdomen, back. Relevant physical exam findings are noted in the Assessment and Plan.  Mid Back Scaly pink plaques upper mid back, abdomen  lower abdomen Hypertrophic scar  face Rhytides and volume loss.    Assessment & Plan  Dermatitis Mid Back  Chronic condition with duration  over one year. Condition is bothersome to patient. Not currently at goal.  Needing to use topical steroids at least 2-3 times per week consistently. Pt defers dupixent.  D/C clobetasol/CeraVe mix and triamcinolone. May restart on weekends only as needed for flares.   Start tacrolimus ointment twice daily as needed. Call if not in good control with this.  tacrolimus (PROTOPIC) 0.1 % ointment - Mid Back Apply topically 2 (two) times daily.  Scar lower abdomen  Symptomatic  NDC [id number]  Intralesional injection - lower abdomen Location: lower abdomen  Informed Consent: Discussed risks (infection, pain, bleeding, bruising, thinning of the skin, loss of skin pigment, lack of resolution, and recurrence of lesion) and benefits of the procedure, as well as the alternatives. Informed consent was obtained. Preparation: The area was prepared a standard fashion.  Procedure Details: An intralesional  injection was performed with Kenalog 5 mg/cc. 0.2 cc in total were injected.  Total number of injections: 3  Plan: The patient was instructed on post-op care. Recommend OTC analgesia as needed for pain.   Elastosis of skin face  Recommend starting a tretinoin, Alastin Restorative Skin Complex twice a day one pump to entire face, Restylane Refyne (perioral "smoker's lines" and lips)/Defyne (perioral, corners of mouth, possible NL folds if any left). Also discussed Halo which she defers at this time  Will prescribe [facility] Anti-Aging Tretinoin 0.025%/Niacinamide/Vitamin C/Vitamin E/Turmeric/Resveratrol with Hyaluronic Acid. Apply pea sized amount nightly to the entire face.  The patient was advised this is not covered by insurance since it is made by a compounding pharmacy. They will receive an email to check out and the medication will be mailed to their home.   Topical retinoid medications like tretinoin can cause dryness and irritation when first started. Only apply a pea-sized amount to the entire affected area. Avoid applying it around the eyes, edges of mouth and creases at the nose. If you experience irritation, use a good moisturizer first and/or apply the medicine less often. If you are doing well with the medicine, you can increase how often you use it until you are applying every night. Be careful with sun protection while using this medication as it can make you sensitive to the sun. This medicine should not be used by pregnant women.     Return in about 3 months (around 02/23/2022) for rash, facial elastosis.  Graciella Belton, RMA, am acting as scribe for Forest Gleason, MD .  Documentation: I have reviewed the above documentation for accuracy and completeness, and I agree with the above.  Forest Gleason, MD

## 2021-09-24 NOTE — Patient Instructions (Addendum)
Gentle Skin Care Guide  1. Bathe no more than once a day.  2. Avoid bathing in hot water  3. Use a mild soap like Dove, Vanicream, Cetaphil, CeraVe. Can use Lever 2000 or Cetaphil antibacterial soap  4. Use soap only where you need it. On most days, use it under your arms, between your legs, and on your feet. Let the water rinse other areas unless visibly dirty.  5. When you get out of the bath/shower, use a towel to gently blot your skin dry, don't rub it.  6. While your skin is still a little damp, apply a moisturizing cream such as Vanicream, CeraVe, Cetaphil, Eucerin, Sarna lotion or plain Vaseline Jelly. For hands apply Neutrogena [place] Hand Cream or Excipial Hand Cream.  7. Reapply moisturizer any time you start to itch or feel dry.  8. Sometimes using free and clear laundry detergents can be helpful. Fabric softener sheets should be avoided. Downy Free & Gentle liquid, or any liquid fabric softener that is free of dyes and perfumes, it acceptable to use  9. If your doctor has given you prescription creams you may apply moisturizers over them   Will prescribe [facility] Anti-Aging Tretinoin 0.025%/Niacinamide/Vitamin C/Vitamin E/Turmeric/Resveratrol with Hyaluronic Acid. Apply pea sized amount nightly to the entire face.  The patient was advised this is not covered by insurance since it is made by a compounding pharmacy. They will receive an email to check out and the medication will be mailed to their home.   Topical retinoid medications like tretinoin can cause dryness and irritation when first started. Only apply a pea-sized amount to the entire affected area. Avoid applying it around the eyes, edges of mouth and creases at the nose. If you experience irritation, use a good moisturizer first and/or apply the medicine less often. If you are doing well with the medicine, you can increase how often you use it until you are applying every night. Be careful with sun protection  while using this medication as it can make you sensitive to the sun. This medicine should not be used by pregnant women.   Instructions for [facility] Medications  One or more of your medications was sent to the [facility] mail order compounding pharmacy. You will receive an email from them and can purchase the medicine through that link. It will then be mailed to your home at the address you confirmed. If for any reason you do not receive an email from them, please check your spam folder. If you still do not find the email, please let us know. [facility] phone number is [phone].  Intralesional steroid injection side effects were reviewed including thinning of the skin and discoloration, such as redness, lightening or darkening.  Recommend daily broad spectrum sunscreen SPF 30+ to sun-exposed areas, reapply every 2 hours as needed. Call for new or changing lesions.  Staying in the shade or wearing long sleeves, sun glasses (UVA+UVB protection) and wide brim hats (4-inch brim around the entire circumference of the hat) are also recommended for sun protection.   If you have any questions or concerns for your doctor, please call our main line at [phone] and press option 4 to reach your doctor's medical assistant. If no one answers, please leave a voicemail as directed and we will return your call as soon as possible. Messages left after 4 pm will be answered the following business day.   You may also send [place] a message via [place]. We typically respond to  MyChart messages within 1-2 business days.  For prescription refills, please ask your pharmacy to contact our office. Our fax number is [phone].  If you have an urgent issue when the clinic is closed that cannot wait until the next business day, you can page your doctor at the number below.    Please note that while we do our best to be available for urgent issues outside of office hours, we are not available 24/7.    If you have an urgent issue and are unable to reach [place], you may choose to seek medical care at your doctor's office, retail clinic, urgent care center, or emergency room.  If you have a medical emergency, please immediately call 911 or go to the emergency department.  Pager Numbers  - Dr. Nehemiah Massed: [phone]  - Dr. Laurence Ferrari: [phone]  - Dr. Nicole Kindred: [phone]  In the event of inclement weather, please call our main line at [phone] for an update on the status of any delays or closures.  Dermatology Medication Tips: Please keep the boxes that topical medications come in in order to help keep track of the instructions about where and how to use these. Pharmacies typically print the medication instructions only on the boxes and not directly on the medication tubes.   If your medication is too expensive, please contact our office at [phone] option 4 or send [place] a message through [place].   We are unable to tell what your co-pay for medications will be in advance as this is different depending on your insurance coverage. However, we may be able to find a substitute medication at lower cost or fill out paperwork to get insurance to cover a needed medication.   If a prior authorization is required to get your medication covered by your insurance company, please allow [place] 1-2 business days to complete this process.  Drug prices often vary depending on where the prescription is filled and some pharmacies may offer cheaper prices.  The website www.goodrx.com contains coupons for medications through different pharmacies. The prices here do not account for what the cost may be with help from insurance (it may be cheaper with your insurance), but the website can give you the price if you did not use any insurance.  - You can print the associated coupon and take it with your prescription to the pharmacy.  - You may also stop by our office during regular business hours and pick up a  GoodRx coupon card.  - If you need your prescription sent electronically to a different pharmacy, notify our office through [facility] or by phone at [phone] option 4.

## 2021-10-01 ENCOUNTER — Telehealth: Payer: Self-pay

## 2021-10-01 NOTE — Telephone Encounter (Signed)
Patient called questioning medications from last weeks office visit. I was unable to understand what one medication was but the second one she was asking if she should use was the [facility] with the first one she stated.   Called patient but unable to leave VM.

## 2021-10-12 ENCOUNTER — Other Ambulatory Visit: Payer: Self-pay | Admitting: Family Medicine

## 2021-10-28 DIAGNOSIS — H35051 Retinal neovascularization, unspecified, right eye: Secondary | ICD-10-CM | POA: Diagnosis not present

## 2021-11-11 LAB — HM DIABETES EYE EXAM

## 2021-11-14 ENCOUNTER — Other Ambulatory Visit: Payer: Self-pay | Admitting: Family Medicine

## 2021-11-30 ENCOUNTER — Telehealth: Payer: Self-pay | Admitting: *Deleted

## 2021-11-30 ENCOUNTER — Encounter: Payer: Self-pay | Admitting: Family Medicine

## 2021-11-30 NOTE — Telephone Encounter (Signed)
Spoke with [name] and discuss her Retinal Scan Results.  Advised no diabetes changes but possible right macular degeneration.  Patient states she is followed by Dr. Benay Pillow who does injections every 7 to 8 weeks.  Dr. Edison Pace told her it was from a retinal hemorrhage that happened over 12 yrs ago.  FYI to Dr. Diona Browner.

## 2021-12-11 ENCOUNTER — Telehealth: Payer: Self-pay

## 2021-12-11 NOTE — Telephone Encounter (Signed)
Spoke with patient and advised her that Dr. Laurence Ferrari would not be in the office next week so I need to reschedule her appointment. Patient states that she was busy at the moment, but she would call back at a later time to reschedule.

## 2021-12-14 ENCOUNTER — Ambulatory Visit: Payer: Medicare PPO | Admitting: Dermatology

## 2021-12-23 DIAGNOSIS — H35051 Retinal neovascularization, unspecified, right eye: Secondary | ICD-10-CM | POA: Diagnosis not present

## 2022-01-06 ENCOUNTER — Ambulatory Visit (INDEPENDENT_AMBULATORY_CARE_PROVIDER_SITE_OTHER): Payer: Medicare Other | Admitting: Podiatry

## 2022-01-06 ENCOUNTER — Encounter: Payer: Self-pay | Admitting: Podiatry

## 2022-01-06 ENCOUNTER — Other Ambulatory Visit: Payer: Self-pay

## 2022-01-06 ENCOUNTER — Ambulatory Visit (INDEPENDENT_AMBULATORY_CARE_PROVIDER_SITE_OTHER): Payer: Medicare Other

## 2022-01-06 DIAGNOSIS — M2042 Other hammer toe(s) (acquired), left foot: Secondary | ICD-10-CM

## 2022-01-06 DIAGNOSIS — D2372 Other benign neoplasm of skin of left lower limb, including hip: Secondary | ICD-10-CM

## 2022-01-06 NOTE — Progress Notes (Signed)
Subjective:  Patient ID: Meghan Kerr, female    DOB: [birth year],  MRN: [id number] HPI Chief Complaint  Patient presents with   Toe Pain    3rd toe left - stinging x 1 month, hard callused area between 3-4, also yesterday started aching dorsal midfoot   New Patient (Initial Visit)    Est pt 2019    71 y.o. female presents with the above complaint.   ROS: Denies fever chills nausea vomiting muscle aches pains calf pain back pain chest pain shortness of breath.  Past Medical History:  Diagnosis Date   Hyperlipidemia    Hypertension    Past Surgical History:  Procedure Laterality Date   APPENDECTOMY     ECTOPIC PREGNANCY SURGERY  1976   HAMMER TOE SURGERY Left 08/19/2017   hardware removed April 2019   TRIGGER FINGER RELEASE  2006   WRIST FRACTURE SURGERY     broken left wrist repair with plate    Current Outpatient Medications:    amLODipine (NORVASC) 5 MG tablet, TAKE 1 TABLET BY MOUTH EVERY DAY, Disp: 90 tablet, Rfl: 3   Apoaequorin (PREVAGEN PO), Take 1 tablet by mouth daily., Disp: , Rfl:    Blood Glucose Monitoring Suppl (ONE TOUCH ULTRA MINI) w/Device KIT, USE TO CHECK BLOOD SUGAR DAILY, Disp: 1 each, Rfl: 0   glucose blood (ONETOUCH ULTRA) test strip, USE TO CHECK BLOOD SUGAR DAILY, Disp: 100 each, Rfl: 3   Lancets (ONETOUCH ULTRASOFT) lancets, USE TO CHECK BLOOD SUGAR DAILY, Disp: 100 each, Rfl: 3   rosuvastatin (CRESTOR) 5 MG tablet, TAKE 1 TABLET BY MOUTH EVERY OTHER DAY, Disp: 45 tablet, Rfl: 3   tacrolimus (PROTOPIC) 0.1 % ointment, Apply topically 2 (two) times daily., Disp: 100 g, Rfl: 0   triamcinolone cream (KENALOG) 0.5 %, APPLY TO AFFECTED AREA TWICE A DAY, Disp: 30 g, Rfl: 0   venlafaxine XR (EFFEXOR-XR) 37.5 MG 24 hr capsule, TAKE 1 CAPSULE BY MOUTH DAILY WITH BREAKFAST., Disp: 90 capsule, Rfl: 3   Vitamin D, Cholecalciferol, 10 MCG (400 UNIT) CHEW, Chew 1 tablet by mouth daily., Disp: , Rfl:   Allergies  Allergen Reactions   Amoxicillin      REACTION: rash   Sulfa Antibiotics Other (See Comments)   Sulfonamide Derivatives     REACTION: Blisters on leg   Review of Systems Objective:  There were no vitals filed for this visit.  General: Well developed, nourished, in no acute distress, alert and oriented x3   Dermatological: Skin is warm, dry and supple bilateral. Nails x 10 are well maintained; remaining integument appears unremarkable at this time. There are no open sores, no preulcerative lesions, no rash or signs of infection present.  Vascular: Dorsalis Pedis artery and Posterior Tibial artery pedal pulses are 2/4 bilateral with immedate capillary fill time. Pedal hair growth present. No varicosities and no lower extremity edema present bilateral.   Neruologic: Grossly intact via light touch bilateral. Vibratory intact via tuning fork bilateral. Protective threshold with Semmes Wienstein monofilament intact to all pedal sites bilateral. Patellar and Achilles deep tendon reflexes 2+ bilateral. No Babinski or clonus noted bilateral.   Musculoskeletal: No gross boney pedal deformities bilateral. No pain, crepitus, or limitation noted with foot and ankle range of motion bilateral. Muscular strength 5/5 in all groups tested bilateral.  Gait: Unassisted, Nonantalgic.    Radiographs:  Radiographs taken today demonstrate an osseously mature individual with fused toes at the PIPJ #2 #3 of the left foot she also retains internal  fixation x2 screws to each metatarsal #2 #3.  Assessment & Plan:   Assessment  Plan     Hooper Petteway T. [place], [place]

## 2022-01-06 NOTE — Progress Notes (Signed)
Subjective:  Patient ID: Meghan Kerr, female    DOB: 1951-12-09,  MRN: [id number] HPI Chief Complaint  Patient presents with   Toe Pain    3rd toe left - stinging x 1 month, hard callused area between 3-4, also yesterday started aching dorsal midfoot   New Patient (Initial Visit)    Est pt 2019    71 y.o. female presents with the above complaint.   ROS: Denies fever chills nausea vomit muscle aches pains calf pain back pain chest pain shortness of breath.  Past Medical History:  Diagnosis Date   Hyperlipidemia    Hypertension    Past Surgical History:  Procedure Laterality Date   APPENDECTOMY     ECTOPIC PREGNANCY SURGERY  1976   HAMMER TOE SURGERY Left 08/19/2017   hardware removed April 2019   TRIGGER FINGER RELEASE  2006   WRIST FRACTURE SURGERY     broken left wrist repair with plate    Current Outpatient Medications:    amLODipine (NORVASC) 5 MG tablet, TAKE 1 TABLET BY MOUTH EVERY DAY, Disp: 90 tablet, Rfl: 3   Apoaequorin (PREVAGEN PO), Take 1 tablet by mouth daily., Disp: , Rfl:    Blood Glucose Monitoring Suppl (ONE TOUCH ULTRA MINI) w/Device KIT, USE TO CHECK BLOOD SUGAR DAILY, Disp: 1 each, Rfl: 0   glucose blood (ONETOUCH ULTRA) test strip, USE TO CHECK BLOOD SUGAR DAILY, Disp: 100 each, Rfl: 3   Lancets (ONETOUCH ULTRASOFT) lancets, USE TO CHECK BLOOD SUGAR DAILY, Disp: 100 each, Rfl: 3   rosuvastatin (CRESTOR) 5 MG tablet, TAKE 1 TABLET BY MOUTH EVERY OTHER DAY, Disp: 45 tablet, Rfl: 3   tacrolimus (PROTOPIC) 0.1 % ointment, Apply topically 2 (two) times daily., Disp: 100 g, Rfl: 0   triamcinolone cream (KENALOG) 0.5 %, APPLY TO AFFECTED AREA TWICE A DAY, Disp: 30 g, Rfl: 0   venlafaxine XR (EFFEXOR-XR) 37.5 MG 24 hr capsule, TAKE 1 CAPSULE BY MOUTH DAILY WITH BREAKFAST., Disp: 90 capsule, Rfl: 3   Vitamin D, Cholecalciferol, 10 MCG (400 UNIT) CHEW, Chew 1 tablet by mouth daily., Disp: , Rfl:   Allergies  Allergen Reactions   Amoxicillin     REACTION:  rash   Sulfa Antibiotics Other (See Comments)   Sulfonamide Derivatives     REACTION: Blisters on leg   Review of Systems Objective:  There were no vitals filed for this visit.  General: Well developed, nourished, in no acute distress, alert and oriented x3   Dermatological: Skin is warm, dry and supple bilateral. Nails x 10 are well maintained; remaining integument appears unremarkable at this time. There are no open sores, no preulcerative lesions, no rash or signs of infection present.  She has a soft corn to the lateral aspect of the third digit overlying the intermediate phalanx laterally.  No open lesions or wounds  Vascular: Dorsalis Pedis artery and Posterior Tibial artery pedal pulses are 2/4 bilateral with immedate capillary fill time. Pedal hair growth present. No varicosities and no lower extremity edema present bilateral.   Neruologic: Grossly intact via light touch bilateral. Vibratory intact via tuning fork bilateral. Protective threshold with Semmes Wienstein monofilament intact to all pedal sites bilateral. Patellar and Achilles deep tendon reflexes 2+ bilateral. No Babinski or clonus noted bilateral.   Musculoskeletal: No gross boney pedal deformities bilateral. No pain, crepitus, or limitation noted with foot and ankle range of motion bilateral. Muscular strength 5/5 in all groups tested bilateral.  Gait: Unassisted, Nonantalgic.    Radiographs:  Radiographs taken today demonstrate rectus second and third toes without spurring.  Retention of to Synthes screws to metatarsals #2 #3 toes are rectus.  Assessment & Plan:   Assessment: Soft corn left third toe  Plan: Discussed etiology pathology conservative therapies debrided the reactive hyperkeratotic tissue discussed the possibility for smoothing the bone down on both sides of the toes also placed her in a silicone pad.     Keali Mccraw T. [place], [place]

## 2022-01-28 ENCOUNTER — Encounter: Payer: Self-pay | Admitting: Dermatology

## 2022-01-28 ENCOUNTER — Ambulatory Visit (INDEPENDENT_AMBULATORY_CARE_PROVIDER_SITE_OTHER): Payer: Medicare Other | Admitting: Dermatology

## 2022-01-28 ENCOUNTER — Other Ambulatory Visit: Payer: Self-pay

## 2022-01-28 ENCOUNTER — Telehealth: Payer: Self-pay

## 2022-01-28 DIAGNOSIS — L988 Other specified disorders of the skin and subcutaneous tissue: Secondary | ICD-10-CM | POA: Diagnosis not present

## 2022-01-28 DIAGNOSIS — L308 Other specified dermatitis: Secondary | ICD-10-CM

## 2022-01-28 DIAGNOSIS — L309 Dermatitis, unspecified: Secondary | ICD-10-CM

## 2022-01-28 NOTE — Progress Notes (Signed)
° °  Follow-Up Visit   Subjective  Meghan Kerr is a 71 y.o. female who presents for the following: Rash (Mid back. 4 month recheck. Used Clobetasol/CeraVe on weekends. Has been using Tacrolimus as directed. Patient reports rash has resolved) and Facial Elastosis (Was prescribed [facility] Anti-Aging Tretinoin, 0.025%/Niacinamide/Vitamin C/Vitamin E/Turmeric/Resveratrol with Hyaluronic Acid. Has not been able to use, cannot get medication to come out of bottle. Is using Alastin Restorative Skin Complex. Has questions on how to use those together).    The following portions of the chart were reviewed this encounter and updated as appropriate:  Tobacco   Allergies   Meds   Problems   Med Hx   Surg Hx   Fam Hx       Review of Systems: No other skin or systemic complaints except as noted in HPI or Assessment and Plan.   Objective  Well appearing patient in no apparent distress; mood and affect are within normal limits.  A focused examination was performed including face, back, chest, arms. Relevant physical exam findings are noted in the Assessment and Plan.  face Rhytides and volume loss.   Chest Scaly pink papules coalescing to plaques at chest    Assessment & Plan  Elastosis of skin face  Start [facility] Anti-Aging Tretinoin, 0.025%/Niacinamide/Vitamin C/Vitamin E/Turmeric/Resveratrol with Hyaluronic Acid. Apply 1st at bedtime  Continue Alastin Restorative Skin Complex, can use twice daily even to eyelid areas. Apply 2nd at bedtime   Called [facility]. They will reach out to patient and send out a courtesy replacement bottle.   Topical retinoid medications like tretinoin/Retin-A, adapalene/Differin, tazarotene/Fabior, and Epiduo/Epiduo Forte can cause dryness and irritation when first started. Only apply a pea-sized amount to the entire affected area. Avoid applying it around the eyes, edges of mouth and creases at the nose. If you experience irritation, use a  good moisturizer first and/or apply the medicine less often. If you are doing well with the medicine, you can increase how often you use it until you are applying every night. Be careful with sun protection while using this medication as it can make you sensitive to the sun. This medicine should not be used by pregnant women.    Other eczema Chest  Chronic condition with duration or expected duration over one year. Condition is bothersome to patient. Currently flared at chest.  Use CeraVe/Clobetasol solution mix twice daily up to 2 weeks then weekends only.   Use Tacrolimus twice daily.  Topical steroids (such as triamcinolone, fluocinolone, fluocinonide, mometasone, clobetasol, halobetasol, betamethasone, hydrocortisone) can cause thinning and lightening of the skin if they are used for too long in the same area. Your physician has selected the right strength medicine for your problem and area affected on the body. Please use your medication only as directed by your physician to prevent side effects.   Eczema is a chronic, relapsing, pruritic condition that can significantly affect quality of life. It is often associated with allergic rhinitis and/or asthma and can require treatment with topical medications, phototherapy, or in severe cases biologic injectable medication (Dupixent; Adbry) or Oral JAK inhibitors.    Return in about 1 year (around 01/28/2023) for TBSE.  I, Emelia Salisbury, CMA, am acting as scribe for Forest Gleason, MD.  Documentation: I have reviewed the above documentation for accuracy and completeness, and I agree with the above.  Forest Gleason, MD

## 2022-01-28 NOTE — Telephone Encounter (Signed)
Called [facility]. They will reach out to patient and send out a courtesy replacement bottle.

## 2022-01-28 NOTE — Patient Instructions (Addendum)
Use CeraVe/Clobetasol solution mix twice daily up to 2 weeks then weekends only. Avoid applying to face, groin, and axilla. Use as directed. Long-term use can cause thinning of the skin.  Topical steroids (such as triamcinolone, fluocinolone, fluocinonide, mometasone, clobetasol, halobetasol, betamethasone, hydrocortisone) can cause thinning and lightening of the skin if they are used for too long in the same area. Your physician has selected the right strength medicine for your problem and area affected on the body. Please use your medication only as directed by your physician to prevent side effects.    Use Tacrolimus twice daily. Can use every day to any body areas  Start [facility] Anti-Aging Tretinoin, 0.025%/Niacinamide/Vitamin C/Vitamin E/Turmeric/Resveratrol with Hyaluronic Acid. Apply 1st at bedtime  Continue Alastin Restorative Skin Complex, can use twice daily even to eyelid areas. Apply 2nd at bedtime.   Instructions for [facility] Medications  One or more of your medications was sent to the [facility] mail order compounding pharmacy. You will receive an email from them and can purchase the medicine through that link. It will then be mailed to your home at the address you confirmed. If for any reason you do not receive an email from them, please check your spam folder. If you still do not find the email, please let us know. [facility] phone number is [phone].   If You Need Anything After Your Visit  If you have any questions or concerns for your doctor, please call our main line at [phone] and press option 4 to reach your doctor's medical assistant. If no one answers, please leave a voicemail as directed and we will return your call as soon as possible. Messages left after 4 pm will be answered the following business day.   You may also send [place] a message via [place]. We typically respond to MyChart messages within 1-2 business days.  For prescription  refills, please ask your pharmacy to contact our office. Our fax number is [phone].  If you have an urgent issue when the clinic is closed that cannot wait until the next business day, you can page your doctor at the number below.    Please note that while we do our best to be available for urgent issues outside of office hours, we are not available 24/7.   If you have an urgent issue and are unable to reach [place], you may choose to seek medical care at your doctor's office, retail clinic, urgent care center, or emergency room.  If you have a medical emergency, please immediately call 911 or go to the emergency department.  Pager Numbers  - Dr. Nehemiah Massed: [phone]  - Dr. Laurence Ferrari: [phone]  - Dr. Nicole Kindred: [phone]  In the event of inclement weather, please call our main line at [phone] for an update on the status of any delays or closures.  Dermatology Medication Tips: Please keep the boxes that topical medications come in in order to help keep track of the instructions about where and how to use these. Pharmacies typically print the medication instructions only on the boxes and not directly on the medication tubes.   If your medication is too expensive, please contact our office at [phone] option 4 or send [place] a message through [place].   We are unable to tell what your co-pay for medications will be in advance as this is different depending on your insurance coverage. However, we may be able to find a substitute medication at lower cost or fill out paperwork to get insurance  to cover a needed medication.   If a prior authorization is required to get your medication covered by your insurance company, please allow [place] 1-2 business days to complete this process.  Drug prices often vary depending on where the prescription is filled and some pharmacies may offer cheaper prices.  The website www.goodrx.com contains coupons for medications through different pharmacies. The  prices here do not account for what the cost may be with help from insurance (it may be cheaper with your insurance), but the website can give you the price if you did not use any insurance.  - You can print the associated coupon and take it with your prescription to the pharmacy.  - You may also stop by our office during regular business hours and pick up a GoodRx coupon card.  - If you need your prescription sent electronically to a different pharmacy, notify our office through [facility] or by phone at [phone] option 4.     Si Usted Necesita Algo Despus de Su Visita  Tambin puede enviarnos un mensaje a travs de Pharmacist, community. Por lo general respondemos a los mensajes de MyChart en el transcurso de 1 a 2 das hbiles.  Para renovar recetas, por favor pida a su farmacia que se ponga en contacto con nuestra oficina. Harland Dingwall de fax es [place] [phone].  Si tiene un asunto urgente cuando la clnica est cerrada y que no puede esperar hasta el siguiente da hbil, puede llamar/localizar a su doctor(a) al nmero que aparece a continuacin.   Por favor, tenga en cuenta que aunque hacemos todo lo posible para estar disponibles para asuntos urgentes fuera del horario de [place], no estamos disponibles las 24 horas del da, los 7 das de la [place].   Si tiene un problema urgente y no puede comunicarse con nosotros, puede optar por buscar atencin mdica  en el consultorio de su doctor(a), en una clnica privada, en un centro de atencin urgente o en una sala de emergencias.  Si tiene Engineering geologist, por favor llame inmediatamente al 911 o vaya a la sala de emergencias.  Nmeros de bper  - Dr. Nehemiah Massed: [phone]  - Dra. Moye: [phone]  - Dra. Nicole Kindred: [phone]  En caso de inclemencias del [place], por favor llame a Johnsie Kindred principal al [phone] para una actualizacin sobre el [place] de cualquier retraso o cierre.  Consejos para la medicacin en  dermatologa: Por favor, guarde las cajas en las que vienen los medicamentos de uso tpico para ayudarle a seguir las instrucciones sobre dnde y cmo usarlos. Las farmacias generalmente imprimen las instrucciones del medicamento slo en las cajas y no directamente en los tubos del [place].   Si su medicamento es muy caro, por favor, pngase en contacto con Zigmund Daniel llamando al [phone] y presione la opcin 4 o envenos un mensaje a travs de Pharmacist, community.   No podemos decirle cul ser su copago por los medicamentos por adelantado ya que esto es diferente dependiendo de la cobertura de su seguro. Sin embargo, es posible que podamos encontrar un medicamento sustituto a Electrical engineer un formulario para que el seguro cubra el medicamento que se considera necesario.   Si se requiere una autorizacin previa para que su compaa de seguros [place] su medicamento, por favor permtanos de 1 a 2 das hbiles para completar este proceso.  Los precios de los medicamentos varan con frecuencia dependiendo del Environmental consultant de dnde se surte la receta y alguna farmacias pueden ofrecer precios  ms baratos.  El sitio web www.goodrx.com tiene cupones para medicamentos de Airline pilot. Los precios aqu no tienen en cuenta lo que podra costar con la ayuda del seguro (puede ser ms barato con su seguro), pero el sitio web puede darle el precio si no utiliz Research scientist (physical sciences).  - Puede imprimir el cupn correspondiente y llevarlo con su receta a la farmacia.  - Tambin puede pasar por nuestra oficina durante el horario de atencin regular y Charity fundraiser una tarjeta de cupones de GoodRx.  - Si necesita que su receta se enve electrnicamente a una farmacia diferente, informe a nuestra oficina a travs de MyChart de [place] o por telfono llamando al [phone] y presione la opcin 4.

## 2022-02-04 ENCOUNTER — Encounter: Payer: Self-pay | Admitting: Dermatology

## 2022-02-10 DIAGNOSIS — H35051 Retinal neovascularization, unspecified, right eye: Secondary | ICD-10-CM | POA: Diagnosis not present

## 2022-02-23 ENCOUNTER — Ambulatory Visit (INDEPENDENT_AMBULATORY_CARE_PROVIDER_SITE_OTHER): Payer: Medicare Other | Admitting: Family Medicine

## 2022-02-23 ENCOUNTER — Other Ambulatory Visit: Payer: Self-pay

## 2022-02-23 VITALS — BP 140/70 | HR 58 | Temp 98.3°F | Resp 14 | Ht 64.5 in | Wt 166.6 lb

## 2022-02-23 DIAGNOSIS — L723 Sebaceous cyst: Secondary | ICD-10-CM | POA: Diagnosis not present

## 2022-02-23 DIAGNOSIS — M65322 Trigger finger, left index finger: Secondary | ICD-10-CM

## 2022-02-23 NOTE — Progress Notes (Incomplete)
Patient ID: Meghan Kerr, female    DOB: [birth year], 71 y.o.   MRN: [id number]  This visit was conducted in person.  BP 140/70 (BP Location: Left Arm, Patient Position: Sitting, Cuff Size: Normal) Comment: Did not take BP meds this AM due to being at exercise classs   Pulse (!) 58    Temp 98.3 F (36.8 C) (Oral)    Resp 14    Ht 5' 4.5" (1.638 m)    Wt 166 lb 9.6 oz (75.6 kg)    SpO2 98%    BMI 28.16 kg/m    CC: Chief Complaint  Patient presents with   Acute Visit    Knot in R ankle, noticed it after starting exercise classes. Trigger finger in left hand radiates to palm area, denies any pain just soreness.     Subjective:   HPI: SHAWNETTE Kerr is a 71 y.o. female presenting on 02/23/2022 for Acute Visit (Knot in R ankle, noticed it after starting exercise classes. Trigger finger in left hand radiates to palm area, denies any pain just soreness. )   She has noted a knot in right ankle... increase size from marble to pea back  to pea size.  No tenderness, no redness  She has been [facility].    No SOB, no swelling in leg.   She has noted trigger finger in 2 nd digit... soreness at base of finger.  Present x 1 month. No falls. Hx of trigger finger in right hand.  Relevant past medical, surgical, family and social history reviewed and updated as indicated. Interim medical history since our last visit reviewed. Allergies and medications reviewed and updated. Outpatient Medications Prior to Visit  Medication Sig Dispense Refill   amLODipine (NORVASC) 5 MG tablet TAKE 1 TABLET BY MOUTH EVERY DAY 90 tablet 3   Apoaequorin (PREVAGEN PO) Take 1 tablet by mouth daily.     Blood Glucose Monitoring Suppl (ONE TOUCH ULTRA MINI) w/Device KIT USE TO CHECK BLOOD SUGAR DAILY 1 each 0   glucose blood (ONETOUCH ULTRA) test strip USE TO CHECK BLOOD SUGAR DAILY 100 each 3   Lancets (ONETOUCH ULTRASOFT) lancets USE TO CHECK BLOOD SUGAR DAILY 100 each 3    rosuvastatin (CRESTOR) 5 MG tablet TAKE 1 TABLET BY MOUTH EVERY OTHER DAY 45 tablet 3   venlafaxine XR (EFFEXOR-XR) 37.5 MG 24 hr capsule TAKE 1 CAPSULE BY MOUTH DAILY WITH BREAKFAST. 90 capsule 3   Vitamin D, Cholecalciferol, 10 MCG (400 UNIT) CHEW Chew 1 tablet by mouth daily.     tacrolimus (PROTOPIC) 0.1 % ointment Apply topically 2 (two) times daily. (Patient not taking: Reported on 02/23/2022) 100 g 0   triamcinolone cream (KENALOG) 0.5 % APPLY TO AFFECTED AREA TWICE A DAY (Patient not taking: Reported on 02/23/2022) 30 g 0   No facility-administered medications prior to visit.     Per HPI unless specifically indicated in ROS section below Review of Systems Objective:  BP 140/70 (BP Location: Left Arm, Patient Position: Sitting, Cuff Size: Normal) Comment: Did not take BP meds this AM due to being at exercise classs   Pulse (!) 58    Temp 98.3 F (36.8 C) (Oral)    Resp 14    Ht 5' 4.5" (1.638 m)    Wt 166 lb 9.6 oz (75.6 kg)    SpO2 98%    BMI 28.16 kg/m   Wt Readings from Last 3 Encounters:  02/23/22 166 lb 9.6 oz (75.6 kg)  09/18/21 167 lb 2 oz (75.8 kg)  04/17/21 169 lb 12 oz (77 kg)      Physical Exam    Results for orders placed or performed in visit on 11/30/21  HM DIABETES EYE EXAM  Result Value Ref Range   HM Diabetic Eye Exam No Retinopathy No Retinopathy    This visit occurred during the SARS-CoV-2 public health emergency.  Safety protocols were in place, including screening questions prior to the visit, additional usage of staff PPE, and extensive cleaning of exam room while observing appropriate contact time as indicated for disinfecting solutions.   COVID 19 screen:  No recent travel or known exposure to COVID19 The patient denies respiratory symptoms of COVID 19 at this time. The importance of social distancing was discussed today.   Assessment and Plan     Eliezer Lofts, MD

## 2022-03-19 ENCOUNTER — Ambulatory Visit: Payer: Medicare PPO | Admitting: Family Medicine

## 2022-03-25 DIAGNOSIS — L723 Sebaceous cyst: Secondary | ICD-10-CM | POA: Insufficient documentation

## 2022-03-25 NOTE — Assessment & Plan Note (Signed)
Acute, stable ? ?No worrisome signs and symptoms.  No need for removal.  Patient will notify me if redness pain or significant increasing in size occurs. ?

## 2022-03-25 NOTE — Assessment & Plan Note (Signed)
Acute, worsening ? ?I discussed treatment of trigger finger with her including buddy taping, injection at the base of her second phalanx versus trigger finger surgical release.  She will return to her hand specialist she has seen in the past to treat trigger finger on her right hand. ?

## 2022-04-06 ENCOUNTER — Ambulatory Visit (INDEPENDENT_AMBULATORY_CARE_PROVIDER_SITE_OTHER): Payer: Medicare Other | Admitting: Family Medicine

## 2022-04-06 VITALS — BP 130/62 | HR 60 | Temp 98.1°F | Resp 12 | Ht 64.5 in | Wt 163.5 lb

## 2022-04-06 DIAGNOSIS — F331 Major depressive disorder, recurrent, moderate: Secondary | ICD-10-CM | POA: Diagnosis not present

## 2022-04-06 DIAGNOSIS — E1159 Type 2 diabetes mellitus with other circulatory complications: Secondary | ICD-10-CM

## 2022-04-06 DIAGNOSIS — I152 Hypertension secondary to endocrine disorders: Secondary | ICD-10-CM | POA: Diagnosis not present

## 2022-04-06 LAB — POCT GLYCOSYLATED HEMOGLOBIN (HGB A1C): Hemoglobin A1C: 6.5 % — AB (ref 4.0–5.6)

## 2022-04-06 LAB — MICROALBUMIN / CREATININE URINE RATIO
Creatinine,U: 158.5 mg/dL
Microalb Creat Ratio: 1 mg/g (ref 0.0–30.0)
Microalb, Ur: 1.5 mg/dL (ref 0.0–1.9)

## 2022-04-06 NOTE — Progress Notes (Signed)
? ? Patient ID: Meghan Kerr, female    DOB: [birth year], 71 y.o.   MRN: [id number] ? ?This visit was conducted in person. ? ?BP (!) 150/76   Pulse 60   Temp 98.1 ?F (36.7 ?C)   Resp 12   Ht 5' 4.5" (1.638 m)   Wt 163 lb 8 oz (74.2 kg)   SpO2 96%   BMI 27.63 kg/m?   ? ?CC:  ?Chief Complaint  ?Patient presents with  ? Diabetes  ?  Follow up  ? ? ?Subjective:  ? ?HPI: ?Meghan Kerr is a 71 y.o. female presenting on 04/06/2022 for Diabetes (Follow up) ? ?Diabetes:   Diet controlled. ?Lab Results  ?Component Value Date  ? HGBA1C 6.5 (A) 04/06/2022  ?Using medications without difficulties: ?Hypoglycemic episodes: none ?Hyperglycemic episodes: none ?Feet problems: no ulcers ?Blood Sugars averaging: FBS 88-104 ?eye exam within last year: ? ? Exercising 4 times a week at [facility] ?Wt Readings from Last 3 Encounters:  ?04/06/22 163 lb 8 oz (74.2 kg)  ?02/23/22 166 lb 9.6 oz (75.6 kg)  ?09/18/21 167 lb 2 oz (75.8 kg)  ? ?MDD. Well controlled on current regimen. Venlafaxine  37.5 mg daily ?  ? ?  04/06/2022  ?  2:39 PM 02/23/2022  ? 10:25 AM 07/20/2021  ?  1:27 PM 07/17/2020  ?  9:50 AM 05/09/2020  ?  9:04 AM  ?Depression screen PHQ 2/9  ?Decreased Interest 0 0 0 0 1  ?Down, Depressed, Hopeless 1 0 0 1 1  ?PHQ - 2 Score 1 0 0 1 2  ?Altered sleeping 0  0 0 2  ?Tired, decreased energy 0  0 0 1  ?Change in appetite 1  0 0 0  ?Feeling bad or failure about yourself  0  0 0 0  ?Trouble concentrating 0  0 0 0  ?Moving slowly or fidgety/restless 0  0 0 0  ?Suicidal thoughts 0  0 0 0  ?PHQ-9 Score 2  0 1 5  ?Difficult doing work/chores Not difficult at all  Not difficult at all Not difficult at all Somewhat difficult  ? ? ?  Hypertension:  Borderline control on amlodipine  mg daily    ?BP Readings from Last 3 Encounters:  ?04/06/22 (!) 150/76  ?02/23/22 140/70  ?09/18/21 140/74  ?Using medication without problems or lightheadedness:  none ?Chest pain with exertion: none ?Edema: none ?Short of breath: none ?Average home BPs: not  checking.  ?Other issues: ? ? ?Relevant past medical, surgical, family and social history reviewed and updated as indicated. Interim medical history since our last visit reviewed. ?Allergies and medications reviewed and updated. ?Outpatient Medications Prior to Visit  ?Medication Sig Dispense Refill  ? amLODipine (NORVASC) 5 MG tablet TAKE 1 TABLET BY MOUTH EVERY DAY 90 tablet 3  ? Apoaequorin (PREVAGEN PO) Take 1 tablet by mouth daily.    ? Blood Glucose Monitoring Suppl (ONE TOUCH ULTRA MINI) w/Device KIT USE TO CHECK BLOOD SUGAR DAILY 1 each 0  ? glucose blood (ONETOUCH ULTRA) test strip USE TO CHECK BLOOD SUGAR DAILY 100 each 3  ? Lancets (ONETOUCH ULTRASOFT) lancets USE TO CHECK BLOOD SUGAR DAILY 100 each 3  ? rosuvastatin (CRESTOR) 5 MG tablet TAKE 1 TABLET BY MOUTH EVERY OTHER DAY 45 tablet 3  ? venlafaxine XR (EFFEXOR-XR) 37.5 MG 24 hr capsule TAKE 1 CAPSULE BY MOUTH DAILY WITH BREAKFAST. 90 capsule 3  ? Vitamin D, Cholecalciferol, 10 MCG (400 UNIT) CHEW Chew 1 tablet  by mouth daily.    ? tacrolimus (PROTOPIC) 0.1 % ointment Apply topically 2 (two) times daily. (Patient not taking: Reported on 02/23/2022) 100 g 0  ? triamcinolone cream (KENALOG) 0.5 % APPLY TO AFFECTED AREA TWICE A DAY (Patient not taking: Reported on 02/23/2022) 30 g 0  ? ?No facility-administered medications prior to visit.  ?  ? ?Per HPI unless specifically indicated in ROS section below ?Review of Systems  ?Constitutional:  Negative for fatigue and fever.  ?HENT:  Negative for congestion.   ?Eyes:  Negative for pain.  ?Respiratory:  Negative for cough and shortness of breath.   ?Cardiovascular:  Negative for chest pain, palpitations and leg swelling.  ?Gastrointestinal:  Negative for abdominal pain.  ?Genitourinary:  Negative for dysuria and vaginal bleeding.  ?Musculoskeletal:  Negative for back pain.  ?Neurological:  Negative for syncope, light-headedness and headaches.  ?Psychiatric/Behavioral:  Negative for dysphoric mood.    ?Objective:  ?BP (!) 150/76   Pulse 60   Temp 98.1 ?F (36.7 ?C)   Resp 12   Ht 5' 4.5" (1.638 m)   Wt 163 lb 8 oz (74.2 kg)   SpO2 96%   BMI 27.63 kg/m?   ?Wt Readings from Last 3 Encounters:  ?04/06/22 163 lb 8 oz (74.2 kg)  ?02/23/22 166 lb 9.6 oz (75.6 kg)  ?09/18/21 167 lb 2 oz (75.8 kg)  ?  ?  ?Physical Exam ?Vitals and nursing note reviewed.  ?Constitutional:   ?   General: She is not in acute distress. ?   Appearance: Normal appearance. She is well-developed. She is not ill-appearing or toxic-appearing.  ?HENT:  ?   Head: Normocephalic.  ?   Right Ear: Hearing, tympanic membrane, ear canal and external ear normal. Tympanic membrane is not erythematous, retracted or bulging.  ?   Left Ear: Hearing, tympanic membrane, ear canal and external ear normal. Tympanic membrane is not erythematous, retracted or bulging.  ?   Nose: Nose normal. No mucosal edema or rhinorrhea.  ?   Right Sinus: No maxillary sinus tenderness or frontal sinus tenderness.  ?   Left Sinus: No maxillary sinus tenderness or frontal sinus tenderness.  ?   Mouth/Throat:  ?   Pharynx: Uvula midline.  ?Eyes:  ?   General: Lids are normal. Lids are everted, no foreign bodies appreciated.  ?   Conjunctiva/sclera: Conjunctivae normal.  ?   Pupils: Pupils are equal, round, and reactive to light.  ?Neck:  ?   Thyroid: No thyroid mass or thyromegaly.  ?   Vascular: No carotid bruit.  ?   Trachea: Trachea normal.  ?Cardiovascular:  ?   Rate and Rhythm: Normal rate and regular rhythm.  ?   Pulses: Normal pulses.  ?   Heart sounds: Normal heart sounds, S1 normal and S2 normal. No murmur heard. ?  No friction rub. No gallop.  ?Pulmonary:  ?   Effort: Pulmonary effort is normal. No tachypnea or respiratory distress.  ?   Breath sounds: Normal breath sounds. No decreased breath sounds, wheezing, rhonchi or rales.  ?Abdominal:  ?   General: Bowel sounds are normal. There is no distension or abdominal bruit.  ?   Palpations: Abdomen is soft. There is no  fluid wave or mass.  ?   Tenderness: There is no abdominal tenderness. There is no guarding or rebound.  ?   Hernia: No hernia is present.  ?Musculoskeletal:  ?   Cervical back: Normal range of motion and neck supple.  ?  Lymphadenopathy:  ?   Cervical: No cervical adenopathy.  ?Skin: ?   General: Skin is warm and dry.  ?   Findings: No rash.  ?Neurological:  ?   Mental Status: She is alert.  ?   Cranial Nerves: No cranial nerve deficit.  ?   Sensory: No sensory deficit.  ?Psychiatric:     ?   Mood and Affect: Mood is not anxious or depressed.     ?   Speech: Speech normal.     ?   Behavior: Behavior normal. Behavior is cooperative.     ?   Thought Content: Thought content normal.     ?   Judgment: Judgment normal.  ? ?   ?Results for orders placed or performed in visit on 04/06/22  ?POCT glycosylated hemoglobin (Hb A1C)  ?Result Value Ref Range  ? Hemoglobin A1C 6.5 (A) 4.0 - 5.6 %  ? HbA1c POC (<> result, manual entry)    ? HbA1c, POC (prediabetic range)    ? HbA1c, POC (controlled diabetic range)    ? ? ?This visit occurred during the SARS-CoV-2 public health emergency.  Safety protocols were in place, including screening questions prior to the visit, additional usage of staff PPE, and extensive cleaning of exam room while observing appropriate contact time as indicated for disinfecting solutions.  ? ?COVID 19 screen:  No recent travel or known exposure to [place] ?The patient denies respiratory symptoms of COVID 19 at this time. ?The importance of social distancing was discussed today.  ? ?Assessment and Plan ? ?  ?Problem List Items Addressed This Visit   ? ? Hypertension associated with diabetes ([place])  ?  Above goal in office today... will have her check at home and call with results. ? ?Amlodipine 5 mg daily ?  ?  ? Major depressive disorder, recurrent episode, moderate ([place])  ?  Stable, chronic.  Continue current medication. ? ? ?Venlafaxine 37.5 XR daily ?  ?  ? Type 2 diabetes mellitus with other circulatory  complications (HTN) ([place]) - Primary  ?  Marland KitchenDiet controlled. Encouraged exercise, weight loss, healthy eating habits. ? ?  ?  ? Relevant Orders  ? POCT glycosylated hemoglobin (Hb A1C) (Completed)  ? Microalbumin/Creatinine Ratio,

## 2022-04-06 NOTE — Assessment & Plan Note (Signed)
Stable, chronic.  Continue current medication. ? ? ?Venlafaxine 37.5 XR daily ?

## 2022-04-06 NOTE — Assessment & Plan Note (Signed)
Diet controlled. Encouraged exercise, weight loss, healthy eating habits.  

## 2022-04-06 NOTE — Assessment & Plan Note (Signed)
Above goal in office today... will have her check at home and call with results. ? ?Amlodipine 5 mg daily ?

## 2022-04-06 NOTE — Patient Instructions (Addendum)
Keep up the great work on healthy eating and regular exercise. ?Goal BP at home < 140/90... call if  is elevated. ?

## 2022-05-26 DIAGNOSIS — H35051 Retinal neovascularization, unspecified, right eye: Secondary | ICD-10-CM | POA: Diagnosis not present

## 2022-06-01 ENCOUNTER — Other Ambulatory Visit: Payer: Self-pay | Admitting: Family Medicine

## 2022-06-01 DIAGNOSIS — Z1231 Encounter for screening mammogram for malignant neoplasm of breast: Secondary | ICD-10-CM

## 2022-07-14 DIAGNOSIS — H2513 Age-related nuclear cataract, bilateral: Secondary | ICD-10-CM | POA: Diagnosis not present

## 2022-07-14 DIAGNOSIS — H35051 Retinal neovascularization, unspecified, right eye: Secondary | ICD-10-CM | POA: Diagnosis not present

## 2022-07-14 DIAGNOSIS — H524 Presbyopia: Secondary | ICD-10-CM | POA: Diagnosis not present

## 2022-07-14 DIAGNOSIS — E119 Type 2 diabetes mellitus without complications: Secondary | ICD-10-CM | POA: Diagnosis not present

## 2022-07-15 ENCOUNTER — Other Ambulatory Visit: Payer: Self-pay | Admitting: Family Medicine

## 2022-07-15 NOTE — Telephone Encounter (Signed)
Last office visit 04/06/22 for DM.   Not on current medication list.  No future appointments with PCP.    Please schedule CPE with fasting labs prior with Dr. Lizabeth Kerr is already scheduled for her [place] with nurse on 07/21/22.  Please schedule her CPE sometime after 09/18/2022.

## 2022-07-16 ENCOUNTER — Ambulatory Visit
Admission: RE | Admit: 2022-07-16 | Discharge: 2022-07-16 | Disposition: A | Discharge status: Home / Self Care | Payer: Medicare Other | Source: Ambulatory Visit | Attending: Family Medicine | Admitting: Family Medicine

## 2022-07-16 DIAGNOSIS — Z1231 Encounter for screening mammogram for malignant neoplasm of breast: Secondary | ICD-10-CM | POA: Diagnosis not present

## 2022-07-16 NOTE — Telephone Encounter (Signed)
Called patient and VM box full.

## 2022-07-21 ENCOUNTER — Ambulatory Visit (INDEPENDENT_AMBULATORY_CARE_PROVIDER_SITE_OTHER): Payer: Medicare Other

## 2022-07-21 VITALS — Ht 64.5 in | Wt 158.0 lb

## 2022-07-21 DIAGNOSIS — Z0000 Encounter for general adult medical examination without abnormal findings: Secondary | ICD-10-CM | POA: Diagnosis not present

## 2022-07-21 NOTE — Patient Instructions (Addendum)
[name] , Thank you for taking time to come for your Medicare Wellness Visit. I appreciate your ongoing commitment to your health goals. Please review the following plan we discussed and let me know if I can assist you in the future.   These are the goals we discussed:  Goals       DIET - INCREASE WATER INTAKE      Starting 06/08/2018, I will attempt to drink at least 6-8 glasses of water daily.       Patient Stated      07/17/2020, I will maintain and continue medications as prescribed.       Patient Stated      07/20/2021, I will continue to go to a exercise class every other Wednesday for 1 hour.      Stay active (pt-stated)        This is a list of the screening recommended for you and due dates:  Health Maintenance  Topic Date Due   COVID-19 Vaccine (5 - Booster for Pfizer series) 08/06/2022*   Zoster (Shingles) Vaccine (1 of 2) 10/21/2022*   Tetanus Vaccine  07/23/2024*   Flu Shot  07/27/2022   Complete foot exam   08/31/2022   Hemoglobin A1C  10/06/2022   Eye exam for diabetics  11/11/2022   Cologuard (Stool DNA test)  08/14/2023   DEXA scan (bone density measurement)  09/07/2023   Mammogram  07/16/2024   Pneumonia Vaccine  Completed   Hepatitis C Screening: USPSTF Recommendation to screen - Ages 62-79 yo.  Completed   HPV Vaccine  Aged Out  *Topic was postponed. The date shown is not the original due date.  t Advanced directives: No   Conditions/risks identified: None  Next appointment: Follow up in one year for your annual wellness visit     Preventive Care 65 Years and Older, Female Preventive care refers to lifestyle choices and visits with your health care provider that can promote health and wellness. What does preventive care include? A yearly physical exam. This is also called an annual well check. Dental exams once or twice a year. Routine eye exams. Ask your health care provider how often you should have your eyes checked. Personal lifestyle  choices, including: Daily care of your teeth and gums. Regular physical activity. Eating a healthy diet. Avoiding tobacco and drug use. Limiting alcohol use. Practicing safe sex. Taking low-dose aspirin every day. Taking vitamin and mineral supplements as recommended by your health care provider. What happens during an annual well check? The services and screenings done by your health care provider during your annual well check will depend on your age, overall health, lifestyle risk factors, and family history of disease. Counseling  Your health care provider may ask you questions about your: Alcohol use. Tobacco use. Drug use. Emotional well-being. Home and relationship well-being. Sexual activity. Eating habits. History of falls. Memory and ability to understand (cognition). Work and work Statistician. Reproductive health. Screening  You may have the following tests or measurements: Height, weight, and BMI. Blood pressure. Lipid and cholesterol levels. These may be checked every 5 years, or more frequently if you are over 35 years old. Skin check. Lung cancer screening. You may have this screening every year starting at age 29 if you have a 30-pack-year history of smoking and currently smoke or have quit within the past 15 years. Fecal occult blood test (FOBT) of the stool. You may have this test every year starting at age 86. Flexible sigmoidoscopy or  colonoscopy. You may have a sigmoidoscopy every 5 years or a colonoscopy every 10 years starting at age 62. Hepatitis C blood test. Hepatitis B blood test. Sexually transmitted disease (STD) testing. Diabetes screening. This is done by checking your blood sugar (glucose) after you have not eaten for a while (fasting). You may have this done every 1-3 years. Bone density scan. This is done to screen for osteoporosis. You may have this done starting at age 38. Mammogram. This may be done every 1-2 years. Talk to your health care  provider about how often you should have regular mammograms. Talk with your health care provider about your test results, treatment options, and if necessary, the need for more tests. Vaccines  Your health care provider may recommend certain vaccines, such as: Influenza vaccine. This is recommended every year. Tetanus, diphtheria, and acellular pertussis (Tdap, Td) vaccine. You may need a Td booster every 10 years. Zoster vaccine. You may need this after age 75. Pneumococcal 13-valent conjugate (PCV13) vaccine. One dose is recommended after age 17. Pneumococcal polysaccharide (PPSV23) vaccine. One dose is recommended after age 45. Talk to your health care provider about which screenings and vaccines you need and how often you need them. This information is not intended to replace advice given to you by your health care provider. Make sure you discuss any questions you have with your health care provider. Document Released: 01/09/2016 Document Revised: 09/01/2016 Document Reviewed: 10/14/2015 Elsevier Interactive Patient Education  2017 [employer] Prevention in the Home Falls can cause injuries. They can happen to people of all ages. There are many things you can do to make your home safe and to help prevent falls. What can I do on the outside of my home? Regularly fix the edges of walkways and driveways and fix any cracks. Remove anything that might make you trip as you walk through a door, such as a raised step or threshold. Trim any bushes or trees on the path to your home. Use bright outdoor lighting. Clear any walking paths of anything that might make someone trip, such as rocks or tools. Regularly check to see if handrails are loose or broken. Make sure that both sides of any steps have handrails. Any raised decks and porches should have guardrails on the edges. Have any leaves, snow, or ice cleared regularly. Use sand or salt on walking paths during winter. Clean up any  spills in your garage right away. This includes oil or grease spills. What can I do in the bathroom? Use night lights. Install grab bars by the toilet and in the tub and shower. Do not use towel bars as grab bars. Use non-skid mats or decals in the tub or shower. If you need to sit down in the shower, use a plastic, non-slip stool. Keep the floor dry. Clean up any water that spills on the floor as soon as it happens. Remove soap buildup in the tub or shower regularly. Attach bath mats securely with double-sided non-slip rug tape. Do not have throw rugs and other things on the floor that can make you trip. What can I do in the bedroom? Use night lights. Make sure that you have a light by your bed that is easy to reach. Do not use any sheets or blankets that are too big for your bed. They should not hang down onto the floor. Have a firm chair that has side arms. You can use this for support while you get dressed. Do not  have throw rugs and other things on the floor that can make you trip. What can I do in the kitchen? Clean up any spills right away. Avoid walking on wet floors. Keep items that you use a lot in easy-to-reach places. If you need to reach something above you, use a strong step stool that has a grab bar. Keep electrical cords out of the way. Do not use floor polish or wax that makes floors slippery. If you must use wax, use non-skid floor wax. Do not have throw rugs and other things on the floor that can make you trip. What can I do with my stairs? Do not leave any items on the stairs. Make sure that there are handrails on both sides of the stairs and use them. Fix handrails that are broken or loose. Make sure that handrails are as long as the stairways. Check any carpeting to make sure that it is firmly attached to the stairs. Fix any carpet that is loose or worn. Avoid having throw rugs at the top or bottom of the stairs. If you do have throw rugs, attach them to the floor  with carpet tape. Make sure that you have a light switch at the top of the stairs and the bottom of the stairs. If you do not have them, ask someone to add them for you. What else can I do to help prevent falls? Wear shoes that: Do not have high heels. Have rubber bottoms. Are comfortable and fit you well. Are closed at the toe. Do not wear sandals. If you use a stepladder: Make sure that it is fully opened. Do not climb a closed stepladder. Make sure that both sides of the stepladder are locked into place. Ask someone to hold it for you, if possible. Clearly mark and make sure that you can see: Any grab bars or handrails. First and last steps. Where the edge of each step is. Use tools that help you move around (mobility aids) if they are needed. These include: Canes. Walkers. Scooters. Crutches. Turn on the lights when you go into a dark area. Replace any light bulbs as soon as they burn out. Set up your furniture so you have a clear path. Avoid moving your furniture around. If any of your floors are uneven, fix them. If there are any pets around you, be aware of where they are. Review your medicines with your doctor. Some medicines can make you feel dizzy. This can increase your chance of falling. Ask your doctor what other things that you can do to help prevent falls. This information is not intended to replace advice given to you by your health care provider. Make sure you discuss any questions you have with your health care provider. Document Released: 10/09/2009 Document Revised: 05/20/2016 Document Reviewed: 01/17/2015 Elsevier Interactive Patient Education  2017 [employer].

## 2022-07-21 NOTE — Progress Notes (Signed)
Subjective:   Meghan Kerr is a 71 y.o. female who presents for Medicare Annual (Subsequent) preventive examination.  Review of Systems    Virtual Visit via Telephone Note  I connected with  Meghan Kerr on 07/21/22 at  1:00 PM EDT by telephone and verified that I am speaking with the correct person using two identifiers.  Location: Patient: Home Provider: Office Persons participating in the virtual visit: patient/Nurse Health Advisor   I discussed the limitations, risks, security and privacy concerns of performing an evaluation and management service by telephone and the availability of in person appointments. The patient expressed understanding and agreed to proceed.  Interactive audio and video telecommunications were attempted between this nurse and patient, however failed, due to patient having technical difficulties OR patient did not have access to video capability.  We continued and completed visit with audio only.  Some vital signs may be absent or patient reported.   Criselda Peaches, LPN  Cardiac Risk Factors include: advanced age (>62mn, >>67women);diabetes mellitus;hypertension     Objective:    Today's Vitals   07/21/22 1318  Weight: 158 lb (71.7 kg)  Height: 5' 4.5" (1.638 m)   Body mass index is 26.7 kg/m.     07/21/2022    1:30 PM 07/20/2021    1:22 PM 07/17/2020    9:46 AM 06/20/2019   10:16 AM 06/08/2018    8:27 AM 12/03/2016   11:07 AM  Advanced Directives  Does Patient Have a Medical Advance Directive? No No No No No Yes  Type of ATeacher, early years/preLiving will  Does patient want to make changes to medical advance directive?    No - Patient declined    [place]?      No - copy requested  Would patient like information on creating a medical advance directive? No - Patient declined No - Patient declined Yes (MAU/Ambulatory/Procedural Areas - Information given) No - Patient  declined Yes (MAU/Ambulatory/Procedural Areas - Information given)     Current Medications (verified) Outpatient Encounter Medications as of 07/21/2022  Medication Sig   amLODipine (NORVASC) 5 MG tablet TAKE 1 TABLET BY MOUTH EVERY DAY   Apoaequorin (PREVAGEN PO) Take 1 tablet by mouth daily.   Blood Glucose Monitoring Suppl (ONE TOUCH ULTRA MINI) w/Device KIT USE TO CHECK BLOOD SUGAR DAILY   glucose blood (ONETOUCH ULTRA) test strip USE TO CHECK BLOOD SUGAR DAILY   Lancets (ONETOUCH ULTRASOFT) lancets USE TO CHECK BLOOD SUGAR DAILY   rosuvastatin (CRESTOR) 5 MG tablet TAKE 1 TABLET BY MOUTH EVERY OTHER DAY   triamcinolone cream (KENALOG) 0.5 % APPLY TO AFFECTED AREA TWICE A DAY   venlafaxine XR (EFFEXOR-XR) 37.5 MG 24 hr capsule TAKE 1 CAPSULE BY MOUTH DAILY WITH BREAKFAST.   Vitamin D, Cholecalciferol, 10 MCG (400 UNIT) CHEW Chew 1 tablet by mouth daily.   No facility-administered encounter medications on file as of 07/21/2022.    Allergies (verified) Amoxicillin, Sulfa antibiotics, and Sulfonamide derivatives   History: Past Medical History:  Diagnosis Date   Hyperlipidemia    Hypertension    Past Surgical History:  Procedure Laterality Date   APPENDECTOMY     ECTOPIC PREGNANCY SURGERY  1976   HAMMER TOE SURGERY Left 08/19/2017   hardware removed April 2019   TRIGGER FINGER RELEASE  2006   WRIST FRACTURE SURGERY     broken left wrist repair with plate  Family History  Problem Relation Age of Onset   Diabetes Mother    Heart disease Mother        cad and chf   Heart disease Father        heart attack (massive)   Cancer Maternal Grandmother        COLON   Stroke Maternal Grandfather    Diabetes Brother    Heart disease Brother 65       hert attack   Heart disease Brother 89       CABG    Stroke Sister    Heart disease Sister        VALVE REPLACEMENT   Breast cancer Sister    Breast cancer Cousin 44   Social History   Socioeconomic History   Marital  status: Married    Spouse name: Not on file   Number of children: Not on file   Years of education: Not on file   Highest education level: Not on file  Occupational History   Occupation: TEACHER    Comment: Kindergarten  Tobacco Use   Smoking status: Never   Smokeless tobacco: Never  Vaping Use   Vaping Use: Never used  Substance and Sexual Activity   Alcohol use: No   Drug use: No   Sexual activity: Not Currently  Other Topics Concern   Not on file  Social History Narrative   Not on file   Social Determinants of Health   Financial Resource Strain: Low Risk  (07/21/2022)   Overall Financial Resource Strain (CARDIA)    Difficulty of Paying Living Expenses: Not hard at all  Food Insecurity: No Food Insecurity (07/21/2022)   Hunger Vital Sign    Worried About Running Out of Food in the Last Year: Never true    [place] in the Last Year: Never true  Transportation Needs: No Transportation Needs (07/21/2022)   PRAPARE - Hydrologist (Medical): No    Lack of Transportation (Non-Medical): No  Physical Activity: Sufficiently Active (07/21/2022)   Exercise Vital Sign    Days of Exercise per Week: 4 days    Minutes of Exercise per Session: 50 min  Stress: No Stress Concern Present (07/21/2022)   [place]    Feeling of Stress : Not at all  Social Connections: [place] (07/21/2022)   Social Connection and Isolation Panel [NHANES]    Frequency of Communication with Friends and Family: More than three times a week    Frequency of Social Gatherings with Friends and Family: More than three times a week    Attends Religious Services: More than 4 times per year    Active Member of [employer] or Organizations: Yes    Attends Music therapist: More than 4 times per year    Marital Status: Married    Tobacco Counseling Counseling given: Not Answered   Clinical  Intake: Nutrition Risk Assessment:  Has the patient had any N/V/D within the last 2 months?  No  Does the patient have any non-healing wounds?  No  Has the patient had any unintentional weight loss or weight gain?  No   Diabetes:  Is the patient diabetic?  No  If diabetic, was a CBG obtained today?  No  Did the patient bring in their glucometer from home?  No  How often do you monitor your CBG's? Daily.   Financial Strains and Diabetes Management:  Are  you having any financial strains with the device, your supplies or your medication? No .  Does the patient want to be seen by Chronic Care Management for management of their diabetes?  No  Would the patient like to be referred to a Nutritionist or for Diabetic Management?  No   Diabetic Exams:  Diabetic Eye Exam: Completed No. Overdue for diabetic eye exam. Pt has been advised about the importance in completing this exam. A referral has been placed today. Message sent to referral coordinator for scheduling purposes. Advised pt to expect a call from office referred to regarding appt.  Diabetic Foot Exam: Completed No. Pt has been advised about the importance in completing this exam. Pt is scheduled for diabetic foot exam on Followed by PCP.   Pre-visit preparation completed: No  Diabetic? Pre Diabetic  Activities of Daily Living    07/21/2022    1:27 PM  In your present state of health, do you have any difficulty performing the following activities:  Hearing? 0  Vision? 0  Difficulty concentrating or making decisions? 0  Walking or climbing stairs? 0  Dressing or bathing? 0  Doing errands, shopping? 0  Preparing Food and eating ? N  Using the Toilet? N  In the past six months, have you accidently leaked urine? N  Do you have problems with loss of bowel control? N  Managing your Medications? N  Managing your Finances? N  Housekeeping or managing your Housekeeping? N    Patient Care Team: Jinny Sanders, MD as PCP -  [facility] Josie Saunders, MD as Consulting Physician (Ophthalmology)  Indicate any recent Medical Services you may have received from other than [facility] providers in the past year (date may be approximate).     Assessment:   This is a routine wellness examination for Meghan Kerr.  Hearing/Vision screen Hearing Screening - Comments:: No hearing difficulty Vision Screening - Comments:: Wears glasses. Followed by [place] issues and exercise activities discussed: Exercise limited by: None identified   Goals Addressed               This Visit's Progress     Stay active (pt-stated)         Depression Screen    07/21/2022    1:25 PM 04/06/2022    2:39 PM 02/23/2022   10:25 AM 07/20/2021    1:27 PM 07/17/2020    9:50 AM 05/09/2020    9:04 AM 04/11/2020    8:49 AM  PHQ 2/9 Scores  PHQ - 2 Score 0 1 0 0 '1 2 2  ' PHQ- 9 Score  2  0 '1 5 7    ' Fall Risk    07/21/2022    1:29 PM 02/23/2022   10:25 AM 07/20/2021    1:26 PM 07/17/2020    9:49 AM 06/20/2019   10:15 AM  Fall Risk   Falls in the past year? 0 0 1 0 0  Number falls in past yr: 0 0 1 0   Injury with Fall? 0 0 0 0   Risk for fall due to : No Fall Risks No Fall Risks Medication side effect Medication side effect   Follow up  Falls evaluation completed Falls evaluation completed;Falls prevention discussed Falls evaluation completed;Falls prevention discussed     FALL RISK PREVENTION PERTAINING TO THE HOME:  Any stairs in or around the home? Yes  If so, are there any without handrails? No  Home free of loose throw rugs in  walkways, pet beds, electrical cords, etc? Yes  Adequate lighting in your home to reduce risk of falls? Yes   ASSISTIVE DEVICES UTILIZED TO PREVENT FALLS:  Life alert? No  Use of a cane, walker or w/c? No  Grab bars in the bathroom? Yes  Shower chair or bench in shower? No  Elevated toilet seat or a handicapped toilet? No   TIMED UP AND GO:  Was the test performed? No . Audio  Visit   Cognitive Function:    07/20/2021    1:34 PM 07/17/2020    9:52 AM 06/20/2019   10:30 AM 06/08/2018    8:27 AM  MMSE - Mini Mental State Exam  Orientation to time '5 5 5 5  ' Orientation to Place '5 5 5 5  ' Registration '3 3 3 3  ' Attention/ Calculation 5 5 0 0  Recall '3 3 3 3  ' Language- name 2 objects   0 0  Language- repeat '1 1 1 1  ' Language- follow 3 step command   0 3  Language- read & follow direction   0 0  Write a sentence   0 0  Copy design   0 0  Total score   17 20        07/21/2022    1:30 PM  6CIT Screen  What Year? 0 points  What month? 0 points  What time? 0 points  Count back from 20 0 points  Months in reverse 0 points  Repeat phrase 0 points  Total Score 0 points    Immunizations Immunization History  Administered Date(s) Administered   Fluad Quad(high Dose 65+) 08/22/2019, 09/18/2021   Influenza Whole 10/24/2008, 10/23/2010   Influenza,inj,Quad PF,6+ Mos 09/13/2014, 01/15/2015, 12/01/2015, 12/03/2016, 10/26/2017, 10/03/2018   PFIZER(Purple Top)SARS-COV-2 Vaccination 02/01/2020, 02/22/2020, 09/29/2020, 07/10/2021   Pneumococcal Conjugate-13 09/13/2014   Pneumococcal Polysaccharide-23 10/24/2008, 01/15/2015   Td 12/27/2005      Flu Vaccine status: Up to date  Pneumococcal vaccine status: Up to date  Covid-19 vaccine status: Completed vaccines  Qualifies for Shingles Vaccine? Yes   Zostavax completed No   Shingrix Completed?: No.    Education has been provided regarding the importance of this vaccine. Patient has been advised to call insurance company to determine out of pocket expense if they have not yet received this vaccine. Advised may also receive vaccine at local pharmacy or Health Dept. Verbalized acceptance and understanding.  Screening Tests Health Maintenance  Topic Date Due   COVID-19 Vaccine (5 - Booster for Pfizer series) 08/06/2022 (Originally 09/04/2021)   Zoster Vaccines- Shingrix (1 of 2) 10/21/2022 (Originally 08/11/1970)    TETANUS/TDAP  07/23/2024 (Originally 12/28/2015)   INFLUENZA VACCINE  07/27/2022   FOOT EXAM  08/31/2022   HEMOGLOBIN A1C  10/06/2022   OPHTHALMOLOGY EXAM  11/11/2022   Fecal DNA (Cologuard)  08/14/2023   DEXA SCAN  09/07/2023   MAMMOGRAM  07/16/2024   Pneumonia Vaccine 62+ Years old  Completed   Hepatitis C Screening  Completed   HPV VACCINES  Aged Out    Health Maintenance  There are no preventive care reminders to display for this patient.   Colorectal cancer screening: Type of screening: Cologuard. Completed 08/13/20. Repeat every 3 years  Mammogram status: Completed 07/16/22. Repeat every year  Bone Density status: Completed 09/06/18. Results reflect: Bone density results: OSTEOPOROSIS. Repeat every 5 years.  Lung Cancer Screening: (Low Dose CT Chest recommended if Age 63-80 years, 30 pack-year currently smoking OR have quit w/in 15years.) does not qualify.  Additional Screening:  Hepatitis C Screening: does qualify; Completed 11/28/15  Vision Screening: Recommended annual ophthalmology exams for early detection of glaucoma and other disorders of the eye. Is the patient up to date with their annual eye exam?  Yes  Who is the provider or what is the name of the office in which the patient attends annual eye exams? [facility] If pt is not established with a provider, would they like to be referred to a provider to establish care? No .   Dental Screening: Recommended annual dental exams for proper oral hygiene  Community Resource Referral / Chronic Care Management:  CRR required this visit?  No   CCM required this visit?  No      Plan:     I have personally reviewed and noted the following in the patient's chart:   Medical and social history Use of alcohol, tobacco or illicit drugs  Current medications and supplements including opioid prescriptions.  Functional ability and status Nutritional status Physical activity Advanced directives List of other  physicians Hospitalizations, surgeries, and ER visits in previous 12 months Vitals Screenings to include cognitive, depression, and falls Referrals and appointments  In addition, I have reviewed and discussed with patient certain preventive protocols, quality metrics, and best practice recommendations. A written personalized care plan for preventive services as well as general preventive health recommendations were provided to patient.     Criselda Peaches, LPN   5/70/1779   Nurse Notes: None

## 2022-07-28 ENCOUNTER — Ambulatory Visit (INDEPENDENT_AMBULATORY_CARE_PROVIDER_SITE_OTHER): Payer: Medicare Other | Admitting: Dermatology

## 2022-07-28 DIAGNOSIS — L988 Other specified disorders of the skin and subcutaneous tissue: Secondary | ICD-10-CM

## 2022-07-28 DIAGNOSIS — I8393 Asymptomatic varicose veins of bilateral lower extremities: Secondary | ICD-10-CM | POA: Diagnosis not present

## 2022-07-28 NOTE — Progress Notes (Unsigned)
   Follow-Up Visit   Subjective  Meghan Kerr is a 71 y.o. female who presents for the following: Varicose Veins (Patient here today to discuss treatment for veins at legs. Patient started exercising about 3 months ago and has noticed they have gotten worse. ).  Patient has noticed some aching in legs.   The following portions of the chart were reviewed this encounter and updated as appropriate:   Tobacco  Allergies  Meds  Problems  Med Hx  Surg Hx  Fam Hx      Review of Systems:  No other skin or systemic complaints except as noted in HPI or Assessment and Plan.  Objective  Well appearing patient in no apparent distress; mood and affect are within normal limits.  A focused examination was performed including legs, face. Relevant physical exam findings are noted in the Assessment and Plan.  face Rhytides and volume loss.     Assessment & Plan  Elastosis of skin face  Start Will prescribe Skin Medicinals Anti-Aging Tretinoin 0.05%/Niacinamide/Vitamin C/Vitamin E/Turmeric/Resveratrol with Hyaluronic Acid. Apply pea sized amount nightly to the entire face.  The patient was advised this is not covered by insurance since it is made by a compounding pharmacy. They will receive an email to check out and the medication will be mailed to their home.   Topical retinoid medications like tretinoin can cause dryness and irritation when first started. Only apply a pea-sized amount to the entire affected area. Avoid applying it around the eyes, edges of mouth and creases at the nose. If you experience irritation, use a good moisturizer first and/or apply the medicine less often. If you are doing well with the medicine, you can increase how often you use it until you are applying every night. Be careful with sun protection while using this medication as it can make you sensitive to the sun. This medicine should not be used by pregnant women.   Varicose Veins/Spider Veins with larger  reticular veins - Dilated blue, purple or red veins at the lower extremities - Smaller vessels can be treated by sclerotherapy (a procedure to inject a medicine into the veins to make them disappear) if desired, but the treatment is not covered by insurance.  - Recommend we send referral to vein specialist to see if they have any concern for reflux.  Return for as scheduled.  Graciella Belton, RMA, am acting as scribe for Forest Gleason, MD .  Documentation: I have reviewed the above documentation for accuracy and completeness, and I agree with the above.  Forest Gleason, MD

## 2022-07-28 NOTE — Patient Instructions (Signed)
Due to recent changes in healthcare laws, you may see results of your pathology and/or laboratory studies on MyChart before the doctors have had a chance to review them. We understand that in some cases there may be results that are confusing or concerning to you. Please understand that not all results are received at the same time and often the doctors may need to interpret multiple results in order to provide you with the best plan of care or course of treatment. Therefore, we ask that you please give us 2 business days to thoroughly review all your results before contacting the office for clarification. Should we see a critical lab result, you will be contacted sooner.   If You Need Anything After Your Visit  If you have any questions or concerns for your doctor, please call our main line at [phone] and press option 4 to reach your doctor's medical assistant. If no one answers, please leave a voicemail as directed and we will return your call as soon as possible. Messages left after 4 pm will be answered the following business day.   You may also send us a message via MyChart. We typically respond to MyChart messages within 1-2 business days.  For prescription refills, please ask your pharmacy to contact our office. Our fax number is [phone].  If you have an urgent issue when the clinic is closed that cannot wait until the next business day, you can page your doctor at the number below.    Please note that while we do our best to be available for urgent issues outside of office hours, we are not available 24/7.   If you have an urgent issue and are unable to reach us, you may choose to seek medical care at your doctor's office, retail clinic, urgent care center, or emergency room.  If you have a medical emergency, please immediately call 911 or go to the emergency department.  Pager Numbers  - Dr. Kowalski: [phone]  - Dr. Moye: [phone]  - Dr. Stewart:  [phone]  In the event of inclement weather, please call our main line at [phone] for an update on the status of any delays or closures.  Dermatology Medication Tips: Please keep the boxes that topical medications come in in order to help keep track of the instructions about where and how to use these. Pharmacies typically print the medication instructions only on the boxes and not directly on the medication tubes.   If your medication is too expensive, please contact our office at [phone] option 4 or send us a message through MyChart.   We are unable to tell what your co-pay for medications will be in advance as this is different depending on your insurance coverage. However, we may be able to find a substitute medication at lower cost or fill out paperwork to get insurance to cover a needed medication.   If a prior authorization is required to get your medication covered by your insurance company, please allow us 1-2 business days to complete this process.  Drug prices often vary depending on where the prescription is filled and some pharmacies may offer cheaper prices.  The website www.goodrx.com contains coupons for medications through different pharmacies. The prices here do not account for what the cost may be with help from insurance (it may be cheaper with your insurance), but the website can give you the price if you did not use any insurance.  - You can print the associated coupon and take it with   your prescription to the pharmacy.  - You may also stop by our office during regular business hours and pick up a GoodRx coupon card.  - If you need your prescription sent electronically to a different pharmacy, notify our office through [facility] MyChart or by phone at [phone] option 4.     Si Usted Necesita Algo Despus de Su Visita  Tambin puede enviarnos un mensaje a travs de MyChart. Por lo general respondemos a los mensajes de MyChart en el transcurso de 1 a 2  das hbiles.  Para renovar recetas, por favor pida a su farmacia que se ponga en contacto con nuestra oficina. Nuestro nmero de fax es el [phone].  Si tiene un asunto urgente cuando la clnica est cerrada y que no puede esperar hasta el siguiente da hbil, puede llamar/localizar a su doctor(a) al nmero que aparece a continuacin.   Por favor, tenga en cuenta que aunque hacemos todo lo posible para estar disponibles para asuntos urgentes fuera del horario de oficina, no estamos disponibles las 24 horas del da, los 7 das de la semana.   Si tiene un problema urgente y no puede comunicarse con nosotros, puede optar por buscar atencin mdica  en el consultorio de su doctor(a), en una clnica privada, en un centro de atencin urgente o en una sala de emergencias.  Si tiene una emergencia mdica, por favor llame inmediatamente al 911 o vaya a la sala de emergencias.  Nmeros de bper  - Dr. Kowalski: [phone]  - Dra. Moye: [phone]  - Dra. Stewart: [phone]  En caso de inclemencias del tiempo, por favor llame a nuestra lnea principal al [phone] para una actualizacin sobre el estado de cualquier retraso o cierre.  Consejos para la medicacin en dermatologa: Por favor, guarde las cajas en las que vienen los medicamentos de uso tpico para ayudarle a seguir las instrucciones sobre dnde y cmo usarlos. Las farmacias generalmente imprimen las instrucciones del medicamento slo en las cajas y no directamente en los tubos del medicamento.   Si su medicamento es muy caro, por favor, pngase en contacto con nuestra oficina llamando al [phone] y presione la opcin 4 o envenos un mensaje a travs de MyChart.   No podemos decirle cul ser su copago por los medicamentos por adelantado ya que esto es diferente dependiendo de la cobertura de su seguro. Sin embargo, es posible que podamos encontrar un medicamento sustituto a menor costo o llenar un formulario para que el  seguro cubra el medicamento que se considera necesario.   Si se requiere una autorizacin previa para que su compaa de seguros cubra su medicamento, por favor permtanos de 1 a 2 das hbiles para completar este proceso.  Los precios de los medicamentos varan con frecuencia dependiendo del lugar de dnde se surte la receta y alguna farmacias pueden ofrecer precios ms baratos.  El sitio web www.goodrx.com tiene cupones para medicamentos de diferentes farmacias. Los precios aqu no tienen en cuenta lo que podra costar con la ayuda del seguro (puede ser ms barato con su seguro), pero el sitio web puede darle el precio si no utiliz ningn seguro.  - Puede imprimir el cupn correspondiente y llevarlo con su receta a la farmacia.  - Tambin puede pasar por nuestra oficina durante el horario de atencin regular y recoger una tarjeta de cupones de GoodRx.  - Si necesita que su receta se enve electrnicamente a una farmacia diferente, informe a nuestra oficina a travs de MyChart de [place]   o por telfono llamando al [phone] y presione la opcin 4.  

## 2022-07-29 ENCOUNTER — Encounter: Payer: Self-pay | Admitting: Dermatology

## 2022-08-05 ENCOUNTER — Other Ambulatory Visit: Payer: Self-pay

## 2022-08-05 DIAGNOSIS — I83893 Varicose veins of bilateral lower extremities with other complications: Secondary | ICD-10-CM

## 2022-08-18 DIAGNOSIS — H35051 Retinal neovascularization, unspecified, right eye: Secondary | ICD-10-CM | POA: Diagnosis not present

## 2022-09-06 LAB — HM DIABETES FOOT EXAM

## 2022-09-21 ENCOUNTER — Telehealth: Payer: Self-pay

## 2022-09-21 ENCOUNTER — Ambulatory Visit (INDEPENDENT_AMBULATORY_CARE_PROVIDER_SITE_OTHER): Payer: Medicare Other | Admitting: Family Medicine

## 2022-09-21 ENCOUNTER — Encounter: Payer: Self-pay | Admitting: Family Medicine

## 2022-09-21 VITALS — BP 120/60 | HR 58 | Temp 98.4°F | Ht 64.25 in | Wt 162.0 lb

## 2022-09-21 DIAGNOSIS — E1159 Type 2 diabetes mellitus with other circulatory complications: Secondary | ICD-10-CM

## 2022-09-21 DIAGNOSIS — Z23 Encounter for immunization: Secondary | ICD-10-CM

## 2022-09-21 DIAGNOSIS — F331 Major depressive disorder, recurrent, moderate: Secondary | ICD-10-CM | POA: Diagnosis not present

## 2022-09-21 DIAGNOSIS — E785 Hyperlipidemia, unspecified: Secondary | ICD-10-CM

## 2022-09-21 DIAGNOSIS — I152 Hypertension secondary to endocrine disorders: Secondary | ICD-10-CM

## 2022-09-21 DIAGNOSIS — E1169 Type 2 diabetes mellitus with other specified complication: Secondary | ICD-10-CM

## 2022-09-21 DIAGNOSIS — Z0000 Encounter for general adult medical examination without abnormal findings: Secondary | ICD-10-CM

## 2022-09-21 LAB — COMPREHENSIVE METABOLIC PANEL
ALT: 10 U/L (ref 0–35)
AST: 13 U/L (ref 0–37)
Albumin: 4 g/dL (ref 3.5–5.2)
Alkaline Phosphatase: 90 U/L (ref 39–117)
BUN: 16 mg/dL (ref 6–23)
CO2: 31 mEq/L (ref 19–32)
Calcium: 9.2 mg/dL (ref 8.4–10.5)
Chloride: 104 mEq/L (ref 96–112)
Creatinine, Ser: 0.76 mg/dL (ref 0.40–1.20)
GFR: 77.9 mL/min (ref >60.00)
Glucose, Bld: 118 mg/dL — ABNORMAL HIGH (ref 70–99)
Potassium: 3.9 mEq/L (ref 3.5–5.1)
Sodium: 139 mEq/L (ref 135–145)
Total Bilirubin: 0.5 mg/dL (ref 0.2–1.2)
Total Protein: 6.7 g/dL (ref 6.0–8.3)

## 2022-09-21 LAB — LIPID PANEL
Cholesterol: 169 mg/dL (ref 0–200)
HDL: 55.4 mg/dL (ref >39.00)
LDL Cholesterol: 97 mg/dL (ref 0–99)
NonHDL: 113.13
Total CHOL/HDL Ratio: 3
Triglycerides: 79 mg/dL (ref 0.0–149.0)
VLDL: 15.8 mg/dL (ref 0.0–40.0)

## 2022-09-21 LAB — HEMOGLOBIN A1C: Hgb A1c MFr Bld: 6.9 % — ABNORMAL HIGH (ref 4.6–6.5)

## 2022-09-21 NOTE — Patient Instructions (Addendum)
Wean off the venlafaxine over time as discussed.  Please stop at the lab to have labs drawn.

## 2022-09-21 NOTE — Assessment & Plan Note (Addendum)
Stable, chronic.  She is interested in coming off.   Wean off the venlafaxine over time as discussed. Venlafaxine XR 37.5 mg p.o. daily

## 2022-09-21 NOTE — Progress Notes (Signed)
Patient ID: Meghan Kerr, female    DOB: 1951/03/22, 71 y.o.   MRN: [id number]  This visit was conducted in person.  BP 120/60   Pulse (!) 58   Temp 98.4 F (36.9 C) (Oral)   Ht 5' 4.25" (1.632 m)   Wt 162 lb (73.5 kg)   SpO2 96%   BMI 27.59 kg/m    CC:  Chief Complaint  Patient presents with   Annual Exam    Subjective:   HPI: Meghan Kerr is a 71 y.o. female presenting on 09/21/2022 for Annual Exam  The patient presents for complete physical and review of chronic health problems. He/She also has the following acute concerns today:  The patient saw a LPN or RN for medicare wellness visit.  Prevention and wellness was reviewed in detail. Note reviewed and important notes copied below.  MDD/GAD: Well controlled on venlafaxine XR 37.5 mg daily Flowsheet Row Clinical Support from 07/21/2022 in [place] at [facility] Total Score 0      Hypertension:  Well-controlled on amlodipine 5 mg p.o. daily BP Readings from Last 3 Encounters:  09/21/22 120/60  04/06/22 130/62  02/23/22 140/70  Using medication without problems or lightheadedness:  occ Chest pain with exertion: none Edema: none Short of breath: none Average home BPs: Other issues: Wt Readings from Last 3 Encounters:  09/21/22 162 lb (73.5 kg)  07/21/22 158 lb (71.7 kg)  04/06/22 163 lb 8 oz (74.2 kg)     Diabetes: Diet controlled in past Using medications without difficulties: Hypoglycemic episodes: Hyperglycemic episodes: Feet problems: no ulcers Blood Sugars averaging: eye exam within last year:    Elevated Cholesterol: LDL at goal on rosuvastatin 5 mg p.o. every other day in past.. due for re-evaluation. sing medications without problems: Muscle aches:  Diet compliance: Exercise: Other complaints:   Relevant past medical, surgical, family and social history reviewed and updated as indicated. Interim medical history since our last visit reviewed. Allergies and  medications reviewed and updated. Outpatient Medications Prior to Visit  Medication Sig Dispense Refill   amLODipine (NORVASC) 5 MG tablet TAKE 1 TABLET BY MOUTH EVERY DAY 90 tablet 3   Apoaequorin (PREVAGEN PO) Take 1 tablet by mouth daily.     Blood Glucose Monitoring Suppl (ONE TOUCH ULTRA MINI) w/Device KIT USE TO CHECK BLOOD SUGAR DAILY 1 each 0   glucose blood (ONETOUCH ULTRA) test strip USE TO CHECK BLOOD SUGAR DAILY 100 each 3   Lancets (ONETOUCH ULTRASOFT) lancets USE TO CHECK BLOOD SUGAR DAILY 100 each 3   rosuvastatin (CRESTOR) 5 MG tablet TAKE 1 TABLET BY MOUTH EVERY OTHER DAY 45 tablet 3   triamcinolone cream (KENALOG) 0.5 % Apply 1 Application topically 2 (two) times daily as needed.     venlafaxine XR (EFFEXOR-XR) 37.5 MG 24 hr capsule TAKE 1 CAPSULE BY MOUTH DAILY WITH BREAKFAST. 90 capsule 3   Vitamin D, Cholecalciferol, 10 MCG (400 UNIT) CHEW Chew 1 tablet by mouth daily.     triamcinolone cream (KENALOG) 0.5 % APPLY TO AFFECTED AREA TWICE A DAY 30 g 0   No facility-administered medications prior to visit.     Per HPI unless specifically indicated in ROS section below Review of Systems  Constitutional:  Negative for fatigue and fever.  HENT:  Negative for congestion.   Eyes:  Negative for pain.  Respiratory:  Negative for cough and shortness of breath.   Cardiovascular:  Negative for chest pain, palpitations and leg  swelling.  Gastrointestinal:  Negative for abdominal pain.  Genitourinary:  Negative for dysuria and vaginal bleeding.  Musculoskeletal:  Negative for back pain.  Neurological:  Negative for syncope, light-headedness and headaches.  Psychiatric/Behavioral:  Negative for dysphoric mood.    Objective:  BP 120/60   Pulse (!) 58   Temp 98.4 F (36.9 C) (Oral)   Ht 5' 4.25" (1.632 m)   Wt 162 lb (73.5 kg)   SpO2 96%   BMI 27.59 kg/m   Wt Readings from Last 3 Encounters:  09/21/22 162 lb (73.5 kg)  07/21/22 158 lb (71.7 kg)  04/06/22 163 lb 8 oz  (74.2 kg)      Physical Exam Vitals and nursing note reviewed.  Constitutional:      General: She is not in acute distress.    Appearance: Normal appearance. She is well-developed. She is not ill-appearing or toxic-appearing.  HENT:     Head: Normocephalic.     Right Ear: Hearing, tympanic membrane, ear canal and external ear normal.     Left Ear: Hearing, tympanic membrane, ear canal and external ear normal.     Nose: Nose normal.  Eyes:     General: Lids are normal. Lids are everted, no foreign bodies appreciated.     Conjunctiva/sclera: Conjunctivae normal.     Pupils: Pupils are equal, round, and reactive to light.  Neck:     Thyroid: No thyroid mass or thyromegaly.     Vascular: No carotid bruit.     Trachea: Trachea normal.  Cardiovascular:     Rate and Rhythm: Normal rate and regular rhythm.     Heart sounds: Normal heart sounds, S1 normal and S2 normal. No murmur heard.    No gallop.  Pulmonary:     Effort: Pulmonary effort is normal. No respiratory distress.     Breath sounds: Normal breath sounds. No wheezing, rhonchi or rales.  Abdominal:     General: Bowel sounds are normal. There is no distension or abdominal bruit.     Palpations: Abdomen is soft. There is no fluid wave or mass.     Tenderness: There is no abdominal tenderness. There is no guarding or rebound.     Hernia: No hernia is present.  Musculoskeletal:     Cervical back: Normal range of motion and neck supple.  Lymphadenopathy:     Cervical: No cervical adenopathy.  Skin:    General: Skin is warm and dry.     Findings: No rash.  Neurological:     Mental Status: She is alert.     Cranial Nerves: No cranial nerve deficit.     Sensory: No sensory deficit.  Psychiatric:        Mood and Affect: Mood is not anxious or depressed.        Speech: Speech normal.        Behavior: Behavior normal. Behavior is cooperative.        Judgment: Judgment normal.   Bilateral varicose veins.  Diabetic foot  exam: Normal inspection No skin breakdown No calluses  Normal DP pulses Normal sensation to light touch and monofilament Nails normal     Results for orders placed or performed in visit on 04/06/22  Microalbumin/Creatinine Ratio, Urine  Result Value Ref Range   Microalb, Ur 1.5 0.0 - 1.9 mg/dL   Creatinine,U 158.5 mg/dL   Microalb Creat Ratio 1.0 0.0 - 30.0 mg/g  POCT glycosylated hemoglobin (Hb A1C)  Result Value Ref Range   Hemoglobin A1C 6.5 (  A) 4.0 - 5.6 %   HbA1c POC (<> result, manual entry)     HbA1c, POC (prediabetic range)     HbA1c, POC (controlled diabetic range)       COVID 19 screen:  No recent travel or known exposure to COVID19 The patient denies respiratory symptoms of COVID 19 at this time. The importance of social distancing was discussed today.   Assessment and Plan The patient's preventative maintenance and recommended screening tests for an annual wellness exam were reviewed in full today. Brought up to date unless services declined.  Counselled on the importance of diet, exercise, and its role in overall health and mortality. The patient's FH and SH was reviewed, including their home life, tobacco status, and drug and alcohol status.   Vaccines; Uptodate PNA, consider shingrix and Td at pharmacy,  S/P COVID19 vaccine x4, due for flu shot.. given high dose today. DVE/pap:pap not indicated, DVE  not indicated   Colon: Date of Last Colonoscopy: 05/28/2007.. In [place] Results: Hyperplastic Polyp, recommended repeat in 5 years. She request stool ifob instead. Had ifob neg 05/2015.  NOW  Cologuard.. nml 03/2017, repeat in 2021 negative, repeat Cologuard in 2024 Mammo:06/2022 normal  DEXA: osteopenia stable T-1.8 spine 09/06/2018, repeat in 5 years. Hep C: completed.   Microalbumin 03/2022  Problem List Items Addressed This Visit     Hyperlipidemia associated with type 2 diabetes mellitus ([place])    Stable, chronic.  Continue current medication.   Crestor 5  mg po ever other day      Relevant Orders   Lipid panel   Comprehensive metabolic panel   Hypertension associated with diabetes (HCC)    Stable, chronic.  Continue current medication.   Amlodipine 5 mg p.o. daily      Major depressive disorder, recurrent episode, moderate (HCC)    Stable, chronic.  She is interested in coming off.   Wean off the venlafaxine over time as discussed. Venlafaxine XR 37.5 mg p.o. daily      Type 2 diabetes mellitus with other circulatory complications (HTN) ([place])    Diet controlled Encouraged exercise, weight loss, healthy eating habits.       Relevant Orders   Hemoglobin A1c   Other Visit Diagnoses     Routine general medical examination at a health care facility    -  Primary   Need for influenza vaccination       Relevant Orders   Flu Vaccine QUAD High Dose(Fluad) (Completed)      Orders Placed This Encounter  Procedures   Flu Vaccine QUAD High Dose(Fluad)   Lipid panel   Hemoglobin A1c   Comprehensive metabolic panel   HM DIABETES FOOT EXAM    This external order was created through the Results Console.     Eliezer Lofts, MD

## 2022-09-21 NOTE — Assessment & Plan Note (Signed)
Stable, chronic.  Continue current medication.   Crestor 5 mg po ever other day

## 2022-09-21 NOTE — Assessment & Plan Note (Signed)
Stable, chronic.  Continue current medication.  Amlodipine 5 mg p.o. daily 

## 2022-09-21 NOTE — Telephone Encounter (Signed)
I spoke with pt; pt has appt with Dr Diona Browner for CPX 09/21/22 at 10:20; pt has not had fasting labs since 08/2021.advised pt fasting for labs is 4 hrs prior to appt. Advised pt she can drink water or black coffee. If pt feels light headed or symptoms where she has to eat she will but pt said she thinks she can wait until appt. Pt has been sipping on water. Nothing further needed at this time.

## 2022-09-21 NOTE — Assessment & Plan Note (Signed)
Diet controlled. Encouraged exercise, weight loss, healthy eating habits.  

## 2022-09-21 NOTE — Telephone Encounter (Signed)
[place] Night - Client Nonclinical Telephone Record  [facility] Client [facility] Night - Client Client Site [place] - Night Provider Eliezer Lofts - MD Contact Type Call Who Is Calling Patient / Member / Family / Caregiver Caller Name Jazzy Parmer Caller Phone Number [phone] Patient Name Nishtha Raider Patient DOB Aug 01, 1951 Call Type Message Only Information Provided Reason for Call Request for General Office Information Initial Comment Caller states that she has an appt this morning. She would like to know if she can eat anything. Additional Comment Office hours provided. Disp. Time Disposition Final User 09/21/2022 7:43:28 AM General Information Provided Yes [place], [place] Call Closed By: Mitchell Heir Transaction Date/Time: 09/21/2022 7:40:41 AM (ET

## 2022-09-24 ENCOUNTER — Ambulatory Visit (INDEPENDENT_AMBULATORY_CARE_PROVIDER_SITE_OTHER): Payer: Medicare Other | Admitting: Vascular Surgery

## 2022-09-24 ENCOUNTER — Encounter (INDEPENDENT_AMBULATORY_CARE_PROVIDER_SITE_OTHER): Payer: Self-pay | Admitting: Vascular Surgery

## 2022-09-24 VITALS — BP 150/69 | HR 60 | Resp 16 | Wt 163.8 lb

## 2022-09-24 DIAGNOSIS — E1159 Type 2 diabetes mellitus with other circulatory complications: Secondary | ICD-10-CM | POA: Diagnosis not present

## 2022-09-24 DIAGNOSIS — E1169 Type 2 diabetes mellitus with other specified complication: Secondary | ICD-10-CM | POA: Diagnosis not present

## 2022-09-24 DIAGNOSIS — I83813 Varicose veins of bilateral lower extremities with pain: Secondary | ICD-10-CM | POA: Diagnosis not present

## 2022-09-24 DIAGNOSIS — I152 Hypertension secondary to endocrine disorders: Secondary | ICD-10-CM

## 2022-09-24 DIAGNOSIS — E785 Hyperlipidemia, unspecified: Secondary | ICD-10-CM

## 2022-09-24 NOTE — Assessment & Plan Note (Signed)
lipid control important in reducing the progression of atherosclerotic disease. Continue statin therapy  

## 2022-09-24 NOTE — Assessment & Plan Note (Signed)
blood glucose control important in reducing the progression of atherosclerotic disease. Also, involved in wound healing. On appropriate medications.  

## 2022-09-24 NOTE — Progress Notes (Signed)
Patient ID: Meghan Kerr, female   DOB: 1951/11/05, 71 y.o.   MRN: [id number]  Chief Complaint  Patient presents with   New Patient (Initial Visit)    Ref [facility] consult varicose veins with other complications, bulging w/no pain,or swelling    HPI Meghan Kerr is a 71 y.o. female.  I am asked to see the patient by Dr. Laurence Ferrari for evaluation of varicose veins.  The patient presents with complaints of symptomatic varicosities of the lower extremities. The patient reports a long standing history of varicosities and they have become painful over time. There was no clear inciting event or causative factor that started the symptoms.  The left leg is more severly affected. The patient elevates the legs for relief. The pain is described as stinging and burning overlying some of the larger varicosities as well as some aching in her legs. The symptoms are generally most severe in the evening, particularly when they have been on their feet for long periods of time.  Elevation and anti-inflammatories has been used to try to improve the symptoms with some success. The patient complains of remittent swelling as an associated symptom. The patient has no previous history of deep venous thrombosis or superficial thrombophlebitis to their knowledge.     Past Medical History:  Diagnosis Date   Hyperlipidemia    Hypertension     Past Surgical History:  Procedure Laterality Date   APPENDECTOMY     ECTOPIC PREGNANCY SURGERY  1976   HAMMER TOE SURGERY Left 08/19/2017   hardware removed April 2019   TRIGGER FINGER RELEASE  2006   WRIST FRACTURE SURGERY     broken left wrist repair with plate    Family History  Problem Relation Age of Onset   Diabetes Mother    Heart disease Mother        cad and chf   Heart disease Father        heart attack (massive)   Cancer Maternal Grandmother        COLON   Stroke Maternal Grandfather    Diabetes Brother    Heart disease Brother 59       hert attack    Heart disease Brother 62       CABG    Stroke Sister    Heart disease Sister        VALVE REPLACEMENT   Breast cancer Sister    Breast cancer Cousin 62      Social History   Tobacco Use   Smoking status: Never   Smokeless tobacco: Never  Vaping Use   Vaping Use: Never used  Substance Use Topics   Alcohol use: No   Drug use: No     Allergies  Allergen Reactions   Amoxicillin     REACTION: rash   Sulfa Antibiotics Other (See Comments)   Sulfonamide Derivatives     REACTION: Blisters on leg    Current Outpatient Medications  Medication Sig Dispense Refill   amLODipine (NORVASC) 5 MG tablet TAKE 1 TABLET BY MOUTH EVERY DAY 90 tablet 3   Apoaequorin (PREVAGEN PO) Take 1 tablet by mouth daily.     Blood Glucose Monitoring Suppl (ONE TOUCH ULTRA MINI) w/Device KIT USE TO CHECK BLOOD SUGAR DAILY 1 each 0   glucose blood (ONETOUCH ULTRA) test strip USE TO CHECK BLOOD SUGAR DAILY 100 each 3   Lancets (ONETOUCH ULTRASOFT) lancets USE TO CHECK BLOOD SUGAR DAILY 100 each 3   rosuvastatin (CRESTOR)  5 MG tablet TAKE 1 TABLET BY MOUTH EVERY OTHER DAY 45 tablet 3   triamcinolone cream (KENALOG) 0.5 % Apply 1 Application topically 2 (two) times daily as needed.     venlafaxine XR (EFFEXOR-XR) 37.5 MG 24 hr capsule TAKE 1 CAPSULE BY MOUTH DAILY WITH BREAKFAST. 90 capsule 3   Vitamin D, Cholecalciferol, 10 MCG (400 UNIT) CHEW Chew 1 tablet by mouth daily.     No current facility-administered medications for this visit.      REVIEW OF SYSTEMS (Negative unless checked)  Constitutional: _0 Weight loss  _1 Fever  _2 Chills Cardiac: _3 Chest pain   _4 Chest pressure   _5 Palpitations   _6 Shortness of breath when laying flat   _7 Shortness of breath at rest   _8 Shortness of breath with exertion. Vascular:  _9 Pain in legs with walking   _10 Pain in legs at rest   _11 Pain in legs when laying flat   _12 Claudication   _13 Pain in feet when walking  _14 Pain in feet at rest  _15 Pain in feet when laying flat    _16 History of DVT   _17 Phlebitis   _18 Swelling in legs   _19 Varicose veins   _20 Non-healing ulcers Pulmonary:   _21 Uses home oxygen   _22 Productive cough   _23 Hemoptysis   _24 Wheeze  _25 COPD   _26 Asthma Neurologic:  _27 Dizziness  _28 Blackouts   _29 Seizures   _30 History of stroke   _31 History of TIA  _32 Aphasia   _33 Temporary blindness   _34 Dysphagia   _35 Weakness or numbness in arms   _36 Weakness or numbness in legs Musculoskeletal:  _37 Arthritis   _38 Joint swelling   _39 Joint pain   _40 Low back pain Hematologic:  _41 Easy bruising  _42 Easy bleeding   _43 Hypercoagulable state   _44 Anemic  _45 Hepatitis Gastrointestinal:  _46 Blood in stool   _47 Vomiting blood  _48 Gastroesophageal reflux/heartburn   _49 Abdominal pain Genitourinary:  _50 Chronic kidney disease   _51 Difficult urination  _52 Frequent urination  _53 Burning with urination   _54 Hematuria Skin:  _55 Rashes   _56 Ulcers   _57 Wounds Psychological:  _58 History of anxiety   _59  History of major depression.    Physical Exam BP (!) 150/69 (BP Location: Right Arm)   Pulse 60   Resp 16   Wt 163 lb 12.8 oz (74.3 kg)   BMI 27.90 kg/m  Gen:  WD/WN, NAD.  Appears younger than stated age Head: [place]/AT, No temporalis wasting.  Ear/Nose/Throat: Hearing grossly intact, dentition good Eyes: Sclera non-icteric. Conjunctiva clear Neck: Supple. Trachea midline Pulmonary:  Good air movement, no use of accessory muscles, respirations not labored.  Cardiac: RRR, No JVD Vascular: Varicosities diffuse and measuring up to 1-2 mm in the right lower extremity        Varicosities diffuse and measuring up to 2 mm in the left lower extremity Vessel Right Left  Radial Palpable Palpable                          PT Palpable Palpable  DP Palpable Palpable   Gastrointestinal: soft, non-tender/non-distended.  Musculoskeletal: M/S 5/5 throughout.   No RLE edema.  Trace LLE edema Neurologic: Sensation grossly intact in extremities.  Symmetrical.  Speech is fluent.  Psychiatric: Judgment intact, Mood &  affect appropriate for pt's clinical situation. Dermatologic: No rashes or ulcers noted.  No cellulitis or open wounds.    Radiology No results found.  Labs Recent Results (from the past 2160 hour(s))  HM DIABETES FOOT EXAM     Status: None   Collection Time: 09/06/22 12:00 AM  Result Value Ref  Range   HM Diabetic Foot Exam done   Lipid panel     Status: None   Collection Time: 09/21/22 10:51 AM  Result Value Ref Range   Cholesterol 169 0 - 200 mg/dL    Comment: ATP III Classification       Desirable:  < 200 mg/dL               Borderline High:  200 - 239 mg/dL          High:  > = 240 mg/dL   Triglycerides 79.0 0.0 - 149.0 mg/dL    Comment: Normal:  <150 mg/dLBorderline High:  150 - 199 mg/dL   HDL 55.40 >39.00 mg/dL   VLDL 15.8 0.0 - 40.0 mg/dL   LDL Cholesterol 97 0 - 99 mg/dL   Total CHOL/HDL Ratio 3     Comment:                Men          Women1/2 Average Risk     3.4          3.3Average Risk          5.0          4.42X Average Risk          9.6          7.13X Average Risk          15.0          11.0                       NonHDL 113.13     Comment: NOTE:  Non-HDL goal should be 30 mg/dL higher than patient's LDL goal (i.e. LDL goal of < 70 mg/dL, would have non-HDL goal of < 100 mg/dL)  Hemoglobin A1c     Status: Abnormal   Collection Time: 09/21/22 10:51 AM  Result Value Ref Range   Hgb A1c MFr Bld 6.9 (H) 4.6 - 6.5 %    Comment: Glycemic Control Guidelines for People with Diabetes:Non Diabetic:  <6%Goal of Therapy: <7%Additional Action Suggested:  >8%   Comprehensive metabolic panel     Status: Abnormal   Collection Time: 09/21/22 10:51 AM  Result Value Ref Range   Sodium 139 135 - 145 mEq/L   Potassium 3.9 3.5 - 5.1 mEq/L   Chloride 104 96 - 112 mEq/L   CO2 31 19 - 32 mEq/L   Glucose, Bld 118 (H) 70 - 99 mg/dL   BUN 16 6 - 23 mg/dL   Creatinine, Ser 0.76 0.40 - 1.20 mg/dL   Total Bilirubin 0.5 0.2 - 1.2 mg/dL   Alkaline Phosphatase 90 39 - 117 U/L   AST 13 0 - 37  U/L   ALT 10 0 - 35 U/L   Total Protein 6.7 6.0 - 8.3 g/dL   Albumin 4.0 3.5 - 5.2 g/dL   GFR 77.90 >60.00 mL/min    Comment: Calculated using the CKD-EPI Creatinine Equation (2021)   Calcium 9.2 8.4 - 10.5 mg/dL    Assessment/Plan:  Hypertension associated with diabetes (HCC) blood pressure control important in reducing the progression of atherosclerotic disease. On appropriate oral medications.   Type 2 diabetes mellitus with other circulatory complications (HTN) (HCC) blood glucose control important in reducing the progression of atherosclerotic disease. Also, involved in wound healing. On appropriate medications.   Hyperlipidemia associated with type 2 diabetes mellitus (HCC) lipid control important in reducing the progression of  atherosclerotic disease. Continue statin therapy   Varicose veins of leg with pain, bilateral   The patient has symptoms consistent with chronic venous insufficiency. We discussed the natural history and treatment options for venous disease. I recommended the regular use of 20 - 30 mm Hg compression stockings, and prescribed these today. I recommended leg elevation and anti-inflammatories as needed for pain. I have also recommended a complete venous duplex to assess the venous system for reflux or thrombotic issues. This can be done at the patient's convenience. I will see the patient back in 3 months to assess the response to conservative management, and determine further treatment options.     Leotis Pain 09/24/2022, 10:26 AM   This note was created with Dragon medical transcription system.  Any errors from dictation are unintentional.

## 2022-09-24 NOTE — Assessment & Plan Note (Signed)
blood pressure control important in reducing the progression of atherosclerotic disease. On appropriate oral medications.  

## 2022-10-20 ENCOUNTER — Encounter (INDEPENDENT_AMBULATORY_CARE_PROVIDER_SITE_OTHER): Payer: BC Managed Care – PPO

## 2022-10-20 ENCOUNTER — Ambulatory Visit (INDEPENDENT_AMBULATORY_CARE_PROVIDER_SITE_OTHER): Payer: BC Managed Care – PPO | Admitting: Nurse Practitioner

## 2022-10-20 DIAGNOSIS — H35051 Retinal neovascularization, unspecified, right eye: Secondary | ICD-10-CM | POA: Diagnosis not present

## 2022-10-25 ENCOUNTER — Encounter (INDEPENDENT_AMBULATORY_CARE_PROVIDER_SITE_OTHER): Payer: Self-pay

## 2022-11-06 ENCOUNTER — Other Ambulatory Visit: Payer: Self-pay | Admitting: Family Medicine

## 2022-11-13 ENCOUNTER — Other Ambulatory Visit: Payer: Self-pay | Admitting: Family Medicine

## 2022-11-16 ENCOUNTER — Ambulatory Visit (INDEPENDENT_AMBULATORY_CARE_PROVIDER_SITE_OTHER): Payer: Medicare Other

## 2022-11-16 ENCOUNTER — Encounter (INDEPENDENT_AMBULATORY_CARE_PROVIDER_SITE_OTHER): Payer: Self-pay | Admitting: Vascular Surgery

## 2022-11-16 ENCOUNTER — Ambulatory Visit (INDEPENDENT_AMBULATORY_CARE_PROVIDER_SITE_OTHER): Payer: Medicare Other | Admitting: Vascular Surgery

## 2022-11-16 VITALS — BP 153/79 | HR 60 | Resp 16 | Wt 163.8 lb

## 2022-11-16 DIAGNOSIS — E1169 Type 2 diabetes mellitus with other specified complication: Secondary | ICD-10-CM

## 2022-11-16 DIAGNOSIS — I152 Hypertension secondary to endocrine disorders: Secondary | ICD-10-CM | POA: Diagnosis not present

## 2022-11-16 DIAGNOSIS — E785 Hyperlipidemia, unspecified: Secondary | ICD-10-CM

## 2022-11-16 DIAGNOSIS — E1159 Type 2 diabetes mellitus with other circulatory complications: Secondary | ICD-10-CM

## 2022-11-16 DIAGNOSIS — I83813 Varicose veins of bilateral lower extremities with pain: Secondary | ICD-10-CM

## 2022-11-16 NOTE — Progress Notes (Signed)
Subjective:    Patient ID: Meghan Kerr, female    DOB: 06-21-1951, 71 y.o.   MRN: [id number] Chief Complaint  Patient presents with   Follow-up    Ultrasound follow up    Meghan Kerr is a 71 year old Caucasian woman who returns to clinic for 24-monthfollow-up.  Her initial visit was for painful varicose veins.  She also stated that she had bilateral lower extremity edema.  Since his visit she has done all of the conventional methods to help reduce leg swelling and vein varicosities.  She states that she elevates her legs every night for a couple of hours.  She is wearing compression stockings/socks as much as she can.  She has increased her exercise level as well as working as a tPharmacist, hospitalmore often as a substitute.  This leads to her doing more walking throughout the day.  Patient today states that she feels like her symptoms after conventional therapy have gotten better.  Today she denies any throbbing or swelling to her legs.  He denies any pain to her lower extremities.  Denies any change in color.  She denies any swollen painful veins in her legs.  She denies any difficulty in walking or pain with walking.  She denies any pain to her lower extremities at rest.    Review of Systems  All other systems reviewed and are negative.      Objective:   Physical Exam Constitutional:      Appearance: Normal appearance. She is normal weight.  Eyes:     Pupils: Pupils are equal, round, and reactive to light.  Cardiovascular:     Rate and Rhythm: Normal rate and regular rhythm.     Pulses: Normal pulses.  Pulmonary:     Effort: Pulmonary effort is normal.     Breath sounds: Normal breath sounds.  Abdominal:     General: Abdomen is flat. Bowel sounds are normal.     Palpations: Abdomen is soft.  Musculoskeletal:        General: Normal range of motion.  Skin:    General: Skin is warm and dry.     Capillary Refill: Capillary refill takes 2 to 3 seconds.  Neurological:      General: No focal deficit present.     Mental Status: She is alert and oriented to person, place, and time. Mental status is at baseline.  Psychiatric:        Mood and Affect: Mood normal.        Behavior: Behavior normal.        Thought Content: Thought content normal.        Judgment: Judgment normal.     BP (!) 153/79 (BP Location: Left Arm)   Pulse 60   Resp 16   Wt 163 lb 12.8 oz (74.3 kg)   BMI 27.90 kg/m   Past Medical History:  Diagnosis Date   Hyperlipidemia    Hypertension     Social History   Socioeconomic History   Marital status: Married    Spouse name: Not on file   Number of children: Not on file   Years of education: Not on file   Highest education level: Not on file  Occupational History   Occupation: TEACHER    Comment: Kindergarten  Tobacco Use   Smoking status: Never   Smokeless tobacco: Never  Vaping Use   Vaping Use: Never used  Substance and Sexual Activity   Alcohol use: No   Drug use:  No   Sexual activity: Not Currently  Other Topics Concern   Not on file  Social History Narrative   Not on file   Social Determinants of Health   Financial Resource Strain: Low Risk  (07/21/2022)   Overall Financial Resource Strain (CARDIA)    Difficulty of Paying Living Expenses: Not hard at all  Food Insecurity: No Food Insecurity (07/21/2022)   Hunger Vital Sign    Worried About Running Out of Food in the Last Year: Never true    Ran Out of Food in the Last Year: Never true  Transportation Needs: No Transportation Needs (07/21/2022)   PRAPARE - Hydrologist (Medical): No    Lack of Transportation (Non-Medical): No  Physical Activity: Sufficiently Active (07/21/2022)   Exercise Vital Sign    Days of Exercise per Week: 4 days    Minutes of Exercise per Session: 50 min  Stress: No Stress Concern Present (07/21/2022)   [place]    Feeling of Stress : Not at  all  Social Connections: [place] (07/21/2022)   Social Connection and Isolation Panel [NHANES]    Frequency of Communication with Friends and Family: More than three times a week    Frequency of Social Gatherings with Friends and Family: More than three times a week    Attends Religious Services: More than 4 times per year    Active Member of [employer] or Organizations: Yes    Attends Archivist Meetings: More than 4 times per year    Marital Status: Married  Human resources officer Violence: Not At Risk (07/21/2022)   Humiliation, Afraid, Rape, and Kick questionnaire    Fear of Current or Ex-Partner: No    Emotionally Abused: No    Physically Abused: No    Sexually Abused: No    Past Surgical History:  Procedure Laterality Date   APPENDECTOMY     ECTOPIC PREGNANCY SURGERY  1976   HAMMER TOE SURGERY Left 08/19/2017   hardware removed April 2019   TRIGGER FINGER RELEASE  2006   WRIST FRACTURE SURGERY     broken left wrist repair with plate    Family History  Problem Relation Age of Onset   Diabetes Mother    Heart disease Mother        cad and chf   Heart disease Father        heart attack (massive)   Cancer Maternal Grandmother        COLON   Stroke Maternal Grandfather    Diabetes Brother    Heart disease Brother 52       hert attack   Heart disease Brother 44       CABG    Stroke Sister    Heart disease Sister        VALVE REPLACEMENT   Breast cancer Sister    Breast cancer Cousin 50    Allergies  Allergen Reactions   Amoxicillin     REACTION: rash   Sulfa Antibiotics Other (See Comments)   Sulfonamide Derivatives     REACTION: Blisters on leg       Latest Ref Rng & Units 06/21/2017   12:12 PM 10/25/2014   10:32 AM 03/25/2011   12:00 AM  CBC  WBC 4.0 - 10.5 K/uL 7.5  8.1  4.7      Hemoglobin 12.0 - 15.0 g/dL 13.8  13.1  14.3      Hematocrit  36.0 - 46.0 % 40.5  40.3  41      Platelets 150.0 - 400.0 K/uL 312.0  305.0  311         This  result is from an external source.      CMP     Component Value Date/Time   NA 139 09/21/2022 1051   NA 139 03/25/2011 0000   K 3.9 09/21/2022 1051   CL 104 09/21/2022 1051   CO2 31 09/21/2022 1051   GLUCOSE 118 (H) 09/21/2022 1051   BUN 16 09/21/2022 1051   BUN 17 03/25/2011 0000   CREATININE 0.76 09/21/2022 1051   CALCIUM 9.2 09/21/2022 1051   PROT 6.7 09/21/2022 1051   ALBUMIN 4.0 09/21/2022 1051   AST 13 09/21/2022 1051   ALT 10 09/21/2022 1051   ALKPHOS 90 09/21/2022 1051   BILITOT 0.5 09/21/2022 1051   GFRNONAA 95.05 10/19/2010 0832   GFRAA 110 02/20/2009 0904     No results found.     Assessment & Plan:   1. Varicose veins of leg with pain, bilateral Recommend:  The patient is following up post a prior visit with painful varicose veins.    I have had a long discussion with the patient regarding  varicose veins and why they cause symptoms.  Patient has been  wearing graduated compression stockings on a daily basis, beginning first thing in the morning and removing them in the evening. The patient is instructed specifically not to sleep in the stockings.    The patient  will also begin using over-the-counter analgesics such as Motrin 600 mg po TID to help control the symptoms as needed.    In addition, behavioral modification including elevation during the day has been initiated, utilizing a recliner was recommended.  The patient is also instructed to continue exercising such as walking 4-5 times per week.  At this time the patient wishes to continue conservative therapy and is not interested in more invasive treatments such as laser ablation and sclerotherapy.  The Patient will follow up in 1 year or PRN if the symptoms worsen.  2. Hypertension associated with diabetes ([place]) Continue antihypertensive medications as already ordered, these medications have been reviewed and there are no changes at this time.  3. Hyperlipidemia associated with type 2 diabetes  mellitus ([place]) Continue statin as ordered and reviewed, no changes at this time   Current Outpatient Medications on File Prior to Visit  Medication Sig Dispense Refill   amLODipine (NORVASC) 5 MG tablet TAKE 1 TABLET BY MOUTH EVERY DAY 90 tablet 3   Apoaequorin (PREVAGEN PO) Take 1 tablet by mouth daily.     Blood Glucose Monitoring Suppl (ONE TOUCH ULTRA MINI) w/Device KIT USE TO CHECK BLOOD SUGAR DAILY 1 each 0   glucose blood (ONETOUCH ULTRA) test strip USE TO CHECK BLOOD SUGAR DAILY 100 each 3   Lancets (ONETOUCH ULTRASOFT) lancets USE TO CHECK BLOOD SUGAR DAILY 100 each 3   rosuvastatin (CRESTOR) 5 MG tablet TAKE 1 TABLET BY MOUTH EVERY OTHER DAY 45 tablet 3   triamcinolone cream (KENALOG) 0.5 % Apply 1 Application topically 2 (two) times daily as needed.     venlafaxine XR (EFFEXOR-XR) 37.5 MG 24 hr capsule TAKE 1 CAPSULE BY MOUTH DAILY WITH BREAKFAST. 90 capsule 3   Vitamin D, Cholecalciferol, 10 MCG (400 UNIT) CHEW Chew 1 tablet by mouth daily.     No current facility-administered medications on file prior to visit.    There are no Patient  Instructions on file for this visit. No follow-ups on file.   Drema Pry, NP

## 2023-01-01 ENCOUNTER — Ambulatory Visit
Admission: EM | Admit: 2023-01-01 | Discharge: 2023-01-01 | Disposition: A | Discharge status: Home / Self Care | Payer: Medicare Other | Attending: Urgent Care | Admitting: Urgent Care

## 2023-01-01 DIAGNOSIS — U071 COVID-19: Secondary | ICD-10-CM | POA: Diagnosis not present

## 2023-01-01 MED ORDER — PAXLOVID (300/100) 20 X 150 MG & 10 X 100MG PO TBPK: ORAL_TABLET | ORAL | 0 refills | Status: DC

## 2023-01-01 NOTE — Discharge Instructions (Signed)
Follow up here or with your primary care provider if your symptoms are worsening or not improving.     

## 2023-01-01 NOTE — ED Triage Notes (Signed)
Pt. Presents to UC sinus congestion.Pt. states she tested positive for Covid this morning and is inquiring about antiviral medication.

## 2023-01-01 NOTE — ED Provider Notes (Signed)
[facility]    CSN: [id number] Arrival date & time: 01/01/23  1015      History   Chief Complaint Chief Complaint  Patient presents with   covid (+)    HPI Meghan Kerr is a 71 y.o. female.   HPI  Patient presents following positive covid testing this morning at home. She is concerned about sinus congestion and requesting antiviral therapy.  Past Medical History:  Diagnosis Date   Hyperlipidemia    Hypertension     Patient Active Problem List   Diagnosis Date Noted   Varicose veins of leg with pain, bilateral 09/24/2022   Sebaceous cyst 03/25/2022   Abnormal MRI, cervical spine 04/17/2021   GAD (generalized anxiety disorder) 04/11/2020   Occipital lymphadenopathy 10/03/2018   Decreased peripheral vision of right eye 12/03/2016   Counseling regarding end of life decision making 12/01/2015   [place] arthritis 12/01/2015   Trigger index finger of left hand 12/01/2015   Osteopenia 06/08/2011   Major depressive disorder, recurrent episode, moderate ([place]) 07/19/2008   Type 2 diabetes mellitus with other circulatory complications (HTN) ([place]) 04/12/2008   Hyperlipidemia associated with type 2 diabetes mellitus ([place]) 03/15/2008   Hypertension associated with diabetes ([place]) 03/15/2008   GERD 03/15/2008   BELL'S PALSY, HX OF 02/24/2005    Past Surgical History:  Procedure Laterality Date   APPENDECTOMY     ECTOPIC PREGNANCY SURGERY  1976   HAMMER TOE SURGERY Left 08/19/2017   hardware removed April 2019   TRIGGER FINGER RELEASE  2006   WRIST FRACTURE SURGERY     broken left wrist repair with plate    OB History   No obstetric history on file.      Home Medications    Prior to Admission medications   Medication Sig Start Date End Date Taking? Authorizing Provider  amLODipine (NORVASC) 5 MG tablet TAKE 1 TABLET BY MOUTH EVERY DAY 11/07/22   Bedsole, Amy E, MD  Apoaequorin (PREVAGEN PO) Take 1 tablet by mouth daily.    [provider]  Blood  Glucose Monitoring Suppl (ONE TOUCH ULTRA MINI) w/Device KIT USE TO CHECK BLOOD SUGAR DAILY 06/13/19   Bedsole, Amy E, MD  glucose blood (ONETOUCH ULTRA) test strip USE TO CHECK BLOOD SUGAR DAILY 06/13/19   Bedsole, Amy E, MD  Lancets (ONETOUCH ULTRASOFT) lancets USE TO CHECK BLOOD SUGAR DAILY 06/13/19   Bedsole, Amy E, MD  rosuvastatin (CRESTOR) 5 MG tablet TAKE 1 TABLET BY MOUTH EVERY OTHER DAY 11/14/22   Bedsole, Amy E, MD  triamcinolone cream (KENALOG) 0.5 % Apply 1 Application topically 2 (two) times daily as needed.    [provider]  venlafaxine XR (EFFEXOR-XR) 37.5 MG 24 hr capsule TAKE 1 CAPSULE BY MOUTH DAILY WITH BREAKFAST. 10/12/21   Bedsole, Amy E, MD  Vitamin D, Cholecalciferol, 10 MCG (400 UNIT) CHEW Chew 1 tablet by mouth daily.    [provider]    Family History Family History  Problem Relation Age of Onset   Diabetes Mother    Heart disease Mother        cad and chf   Heart disease Father        heart attack (massive)   Cancer Maternal Grandmother        COLON   Stroke Maternal Grandfather    Diabetes Brother    Heart disease Brother 46       hert attack   Heart disease Brother 30  CABG    Stroke Sister    Heart disease Sister        VALVE REPLACEMENT   Breast cancer Sister    Breast cancer Cousin 64    Social History Social History   Tobacco Use   Smoking status: Never   Smokeless tobacco: Never  Vaping Use   Vaping Use: Never used  Substance Use Topics   Alcohol use: No   Drug use: No     Allergies   Amoxicillin, Penicillins, Sulfa antibiotics, and Sulfonamide derivatives   Review of Systems Review of Systems   Physical Exam Triage Vital Signs ED Triage Vitals  Enc Vitals Group     BP 01/01/23 1111 (!) 166/67     Pulse Rate 01/01/23 1111 (!) 56     Resp 01/01/23 1111 16     Temp 01/01/23 1111 97.6 F (36.4 C)     Temp Source 01/01/23 1111 Temporal     SpO2 01/01/23 1111 96 %     Weight --      Height --       Head Circumference --      Peak Flow --      Pain Score 01/01/23 1113 0     Pain Loc --      Pain Edu? --      Excl. in [place]? --    No data found.  Updated Vital Signs BP (!) 166/67 (BP Location: Left Arm)   Pulse (!) 56   Temp 97.6 F (36.4 C) (Temporal)   Resp 16   SpO2 96%   Visual Acuity Right Eye Distance:   Left Eye Distance:   Bilateral Distance:    Right Eye Near:   Left Eye Near:    Bilateral Near:     Physical Exam   UC Treatments / Results  Labs (all labs ordered are listed, but only abnormal results are displayed) Labs Reviewed - No data to display  EKG   Radiology No results found.  Procedures Procedures (including critical care time)  Medications Ordered in UC Medications - No data to display  Initial Impression / Assessment and Plan / UC Course  I have reviewed the triage vital signs and the nursing notes.  Pertinent labs & imaging results that were available during my care of the patient were reviewed by me and considered in my medical decision making (see chart for details).  Patient is afebrile here without recent antipyretics. Satting well on room air. Overall is well appearing, well hydrated, without respiratory distress. Pulmonary exam is unremarkable.  Lungs CTAB without wheezing, rhonchi, rales.  Positive COVID versus at home test and requesting antiviral treatment since she is well within the treatment window.  Will prescribe Paxlovid as she has no contraindications.  Patient is at risk for poor outcome due to COVID given her age and comorbidities including DM 2 and hypertension.   Normal GFR. Well controlled glucose control per recent lab report.  Final Clinical Impressions(s) / UC Diagnoses   Final diagnoses:  None   Discharge Instructions   None    ED Prescriptions   None    PDMP not reviewed this encounter.   Rose Phi, [place] 01/01/23 1130

## 2023-01-05 ENCOUNTER — Other Ambulatory Visit: Payer: Self-pay | Admitting: Family Medicine

## 2023-01-12 DIAGNOSIS — H35051 Retinal neovascularization, unspecified, right eye: Secondary | ICD-10-CM | POA: Diagnosis not present

## 2023-02-03 ENCOUNTER — Ambulatory Visit (INDEPENDENT_AMBULATORY_CARE_PROVIDER_SITE_OTHER): Payer: Medicare Other | Admitting: Dermatology

## 2023-02-03 ENCOUNTER — Encounter: Payer: Self-pay | Admitting: Dermatology

## 2023-02-03 VITALS — BP 168/81 | HR 58

## 2023-02-03 DIAGNOSIS — L570 Actinic keratosis: Secondary | ICD-10-CM | POA: Diagnosis not present

## 2023-02-03 DIAGNOSIS — L821 Other seborrheic keratosis: Secondary | ICD-10-CM | POA: Diagnosis not present

## 2023-02-03 DIAGNOSIS — L739 Follicular disorder, unspecified: Secondary | ICD-10-CM | POA: Diagnosis not present

## 2023-02-03 DIAGNOSIS — L814 Other melanin hyperpigmentation: Secondary | ICD-10-CM | POA: Diagnosis not present

## 2023-02-03 DIAGNOSIS — Z1283 Encounter for screening for malignant neoplasm of skin: Secondary | ICD-10-CM

## 2023-02-03 DIAGNOSIS — L578 Other skin changes due to chronic exposure to nonionizing radiation: Secondary | ICD-10-CM

## 2023-02-03 DIAGNOSIS — D229 Melanocytic nevi, unspecified: Secondary | ICD-10-CM

## 2023-02-03 MED ORDER — CLINDAMYCIN PHOSPHATE 1 % EX GEL: CUTANEOUS | 2 refills | Status: DC

## 2023-02-03 NOTE — Patient Instructions (Addendum)
Cryotherapy Aftercare  Wash gently with soap and water everyday.   Apply Vaseline and Band-Aid daily until healed.     Scalp: Start Clindamycin gel once or twice daily as needed  Continue Triamcinolone cream as needed for itching.   Topical steroids (such as triamcinolone, fluocinolone, fluocinonide, mometasone, clobetasol, halobetasol, betamethasone, hydrocortisone) can cause thinning and lightening of the skin if they are used for too long in the same area. Your physician has selected the right strength medicine for your problem and area affected on the body. Please use your medication only as directed by your physician to prevent side effects.     Recommend taking Heliocare sun protection supplement daily in sunny weather for additional sun protection. For maximum protection on the sunniest days, you can take up to 2 capsules of regular Heliocare OR take 1 capsule of Heliocare Ultra. For prolonged exposure (such as a full day in the sun), you can repeat your dose of the supplement 4 hours after your first dose. Heliocare can be purchased at [employer], at some [facility] or at [email or web address].     Recommend daily broad spectrum sunscreen SPF 30+ to sun-exposed areas, reapply every 2 hours as needed. Call for new or changing lesions.  Staying in the shade or wearing long sleeves, sun glasses (UVA+UVB protection) and wide brim hats (4-inch brim around the entire circumference of the hat) are also recommended for sun protection.    Melanoma ABCDEs  Melanoma is the most dangerous type of skin cancer, and is the leading cause of death from skin disease.  You are more likely to develop melanoma if you: Have light-colored skin, light-colored eyes, or red or blond hair Spend a lot of time in the sun Tan regularly, either outdoors or in a tanning bed Have had blistering sunburns, especially during childhood Have a close family member who has had a melanoma Have atypical moles or  large birthmarks  Early detection of melanoma is key since treatment is typically straightforward and cure rates are extremely high if we catch it early.   The first sign of melanoma is often a change in a mole or a new dark spot.  The ABCDE system is a way of remembering the signs of melanoma.  A for asymmetry:  The two halves do not match. B for border:  The edges of the growth are irregular. C for color:  A mixture of colors are present instead of an even brown color. D for diameter:  Melanomas are usually (but not always) greater than 76m - the size of a pencil eraser. E for evolution:  The spot keeps changing in size, shape, and color.  Please check your skin once per month between visits. You can use a small mirror in front and a large mirror behind you to keep an eye on the back side or your body.   If you see any new or changing lesions before your next follow-up, please call to schedule a visit.  Please continue daily skin protection including broad spectrum sunscreen SPF 30+ to sun-exposed areas, reapplying every 2 hours as needed when you're outdoors.   Staying in the shade or wearing long sleeves, sun glasses (UVA+UVB protection) and wide brim hats (4-inch brim around the entire circumference of the hat) are also recommended for sun protection.    Due to recent changes in healthcare laws, you may see results of your pathology and/or laboratory studies on MyChart before the doctors have had a chance to review them.  We understand that in some cases there may be results that are confusing or concerning to you. Please understand that not all results are received at the same time and often the doctors may need to interpret multiple results in order to provide you with the best plan of care or course of treatment. Therefore, we ask that you please give [place] 2 business days to thoroughly review all your results before contacting the office for clarification. Should we see a critical lab  result, you will be contacted sooner.   If You Need Anything After Your Visit  If you have any questions or concerns for your doctor, please call our main line at [phone] and press option 4 to reach your doctor's medical assistant. If no one answers, please leave a voicemail as directed and we will return your call as soon as possible. Messages left after 4 pm will be answered the following business day.   You may also send [place] a message via [place]. We typically respond to MyChart messages within 1-2 business days.  For prescription refills, please ask your pharmacy to contact our office. Our fax number is [phone].  If you have an urgent issue when the clinic is closed that cannot wait until the next business day, you can page your doctor at the number below.    Please note that while we do our best to be available for urgent issues outside of office hours, we are not available 24/7.   If you have an urgent issue and are unable to reach [place], you may choose to seek medical care at your doctor's office, retail clinic, urgent care center, or emergency room.  If you have a medical emergency, please immediately call 911 or go to the emergency department.  Pager Numbers  - Dr. Nehemiah Massed: [phone]  - Dr. Laurence Ferrari: [phone]  - Dr. Nicole Kindred: [phone]  In the event of inclement weather, please call our main line at [phone] for an update on the status of any delays or closures.  Dermatology Medication Tips: Please keep the boxes that topical medications come in in order to help keep track of the instructions about where and how to use these. Pharmacies typically print the medication instructions only on the boxes and not directly on the medication tubes.   If your medication is too expensive, please contact our office at [phone] option 4 or send [place] a message through [place].   We are unable to tell what your co-pay for medications will be in advance as this is  different depending on your insurance coverage. However, we may be able to find a substitute medication at lower cost or fill out paperwork to get insurance to cover a needed medication.   If a prior authorization is required to get your medication covered by your insurance company, please allow [place] 1-2 business days to complete this process.  Drug prices often vary depending on where the prescription is filled and some pharmacies may offer cheaper prices.  The website www.goodrx.com contains coupons for medications through different pharmacies. The prices here do not account for what the cost may be with help from insurance (it may be cheaper with your insurance), but the website can give you the price if you did not use any insurance.  - You can print the associated coupon and take it with your prescription to the pharmacy.  - You may also stop by our office during regular business hours and pick up a GoodRx coupon card.  - If  you need your prescription sent electronically to a different pharmacy, notify our office through [facility] or by phone at [phone] option 4.     Si Usted Necesita Algo Despus de Su Visita  Tambin puede enviarnos un mensaje a travs de Pharmacist, community. Por lo general respondemos a los mensajes de MyChart en el transcurso de 1 a 2 das hbiles.  Para renovar recetas, por favor pida a su farmacia que se ponga en contacto con nuestra oficina. Harland Dingwall de fax es [place] [phone].  Si tiene un asunto urgente cuando la clnica est cerrada y que no puede esperar hasta el siguiente da hbil, puede llamar/localizar a su doctor(a) al nmero que aparece a continuacin.   Por favor, tenga en cuenta que aunque hacemos todo lo posible para estar disponibles para asuntos urgentes fuera del horario de [place], no estamos disponibles las 24 horas del da, los 7 das de la [place].   Si tiene un problema urgente y no puede comunicarse con nosotros, puede optar por buscar  atencin mdica  en el consultorio de su doctor(a), en una clnica privada, en un centro de atencin urgente o en una sala de emergencias.  Si tiene Engineering geologist, por favor llame inmediatamente al 911 o vaya a la sala de emergencias.  Nmeros de bper  - Dr. Nehemiah Massed: [phone]  - Dra. Moye: [phone]  - Dra. Nicole Kindred: [phone]  En caso de inclemencias del [place], por favor llame a Johnsie Kindred principal al [phone] para una actualizacin sobre el [place] de cualquier retraso o cierre.  Consejos para la medicacin en dermatologa: Por favor, guarde las cajas en las que vienen los medicamentos de uso tpico para ayudarle a seguir las instrucciones sobre dnde y cmo usarlos. Las farmacias generalmente imprimen las instrucciones del medicamento slo en las cajas y no directamente en los tubos del [place].   Si su medicamento es muy caro, por favor, pngase en contacto con Zigmund Daniel llamando al [phone] y presione la opcin 4 o envenos un mensaje a travs de Pharmacist, community.   No podemos decirle cul ser su copago por los medicamentos por adelantado ya que esto es diferente dependiendo de la cobertura de su seguro. Sin embargo, es posible que podamos encontrar un medicamento sustituto a Electrical engineer un formulario para que el seguro cubra el medicamento que se considera necesario.   Si se requiere una autorizacin previa para que su compaa de seguros [place] su medicamento, por favor permtanos de 1 a 2 das hbiles para completar este proceso.  Los precios de los medicamentos varan con frecuencia dependiendo del Environmental consultant de dnde se surte la receta y alguna farmacias pueden ofrecer precios ms baratos.  El sitio web www.goodrx.com tiene cupones para medicamentos de Airline pilot. Los precios aqu no tienen en cuenta lo que podra costar con la ayuda del seguro (puede ser ms barato con su seguro), pero el sitio web puede darle el precio si no utiliz  Research scientist (physical sciences).  - Puede imprimir el cupn correspondiente y llevarlo con su receta a la farmacia.  - Tambin puede pasar por nuestra oficina durante el horario de atencin regular y Charity fundraiser una tarjeta de cupones de GoodRx.  - Si necesita que su receta se enve electrnicamente a una farmacia diferente, informe a nuestra oficina a travs de MyChart de [place] o por telfono llamando al [phone] y presione la opcin 4.

## 2023-02-03 NOTE — Progress Notes (Signed)
Follow-Up Visit   Subjective  Meghan Kerr is a 72 y.o. female who presents for the following: Annual Exam (No personal hx of skin cancer or dysplastic nevi. C/O itchy area on scalp).  The patient presents for Total-Body Skin Exam (TBSE) for skin cancer screening and mole check.  The patient has spots, moles and lesions to be evaluated, some may be new or changing and the patient has concerns that these could be cancer.   The following portions of the chart were reviewed this encounter and updated as appropriate:  Tobacco  Allergies  Meds  Problems  Med Hx  Surg Hx  Fam Hx      Review of Systems: No other skin or systemic complaints except as noted in HPI or Assessment and Plan.   Objective  Well appearing patient in no apparent distress; mood and affect are within normal limits.  A full examination was performed including scalp, head, eyes, ears, nose, lips, neck, chest, axillae, abdomen, back, buttocks, bilateral upper extremities, bilateral lower extremities, hands, feet, fingers, toes, fingernails, and toenails. All findings within normal limits unless otherwise noted below.  Scalp Follicular-based erythematous papules   Right Shoulder - Posterior x1 Erythematous thin papules/macules with gritty scale.    Assessment & Plan   Lentigines - Scattered tan macules - Due to sun exposure - Benign-appearing, observe - Recommend daily broad spectrum sunscreen SPF 30+ to sun-exposed areas, reapply every 2 hours as needed. - Call for any changes  Seborrheic Keratoses - Stuck-on, waxy, tan-brown papules and/or plaques  - Benign-appearing - Discussed benign etiology and prognosis. - Observe - Call for any changes  Melanocytic Nevi - Tan-brown and/or pink-flesh-colored symmetric macules and papules - Benign appearing on exam today - Observation - Call clinic for new or changing moles - Recommend daily use of broad spectrum spf 30+ sunscreen to sun-exposed areas.   - Check nails when remove polish.  Hemangiomas - Red papules - Discussed benign nature - Observe - Call for any changes  Actinic Damage - Chronic condition, secondary to cumulative UV/sun exposure - diffuse scaly erythematous macules with underlying dyspigmentation - Recommend daily broad spectrum sunscreen SPF 30+ to sun-exposed areas, reapply every 2 hours as needed.  - Staying in the shade or wearing long sleeves, sun glasses (UVA+UVB protection) and wide brim hats (4-inch brim around the entire circumference of the hat) are also recommended for sun protection.  - Call for new or changing lesions.  Skin cancer screening performed today.  Folliculitis Scalp  Start Clindamycin gel once or twice daily as needed  Continue Triamcinolone cream twice a day as needed as needed for itching.   Topical steroids (such as triamcinolone, fluocinolone, fluocinonide, mometasone, clobetasol, halobetasol, betamethasone, hydrocortisone) can cause thinning and lightening of the skin if they are used for too long in the same area. Your physician has selected the right strength medicine for your problem and area affected on the body. Please use your medication only as directed by your physician to prevent side effects.    clindamycin (CLINDAGEL) 1 % gel - Scalp Apply once or twice daily to affected areas on scalp as needed  AK (actinic keratosis) Right Shoulder - Posterior x1  Actinic keratoses are precancerous spots that appear secondary to cumulative UV radiation exposure/sun exposure over time. They are chronic with expected duration over 1 year. A portion of actinic keratoses will progress to squamous cell carcinoma of the skin. It is not possible to reliably predict which spots will progress  to skin cancer and so treatment is recommended to prevent development of skin cancer.  Recommend daily broad spectrum sunscreen SPF 30+ to sun-exposed areas, reapply every 2 hours as needed.  Recommend  staying in the shade or wearing long sleeves, sun glasses (UVA+UVB protection) and wide brim hats (4-inch brim around the entire circumference of the hat). Call for new or changing lesions.  Destruction of lesion - Right Shoulder - Posterior x1  Destruction method: cryotherapy   Informed consent: discussed and consent obtained   Lesion destroyed using liquid nitrogen: Yes   Region frozen until ice ball extended beyond lesion: Yes   Outcome: patient tolerated procedure well with no complications   Post-procedure details: wound care instructions given   Additional details:  Prior to procedure, discussed risks of blister formation, small wound, skin dyspigmentation, or rare scar following cryotherapy. Recommend Vaseline ointment to treated areas while healing.    Return in about 1 year (around 02/04/2024) for TBSE.  I, Emelia Salisbury, CMA, am acting as scribe for Forest Gleason, MD.  Documentation: I have reviewed the above documentation for accuracy and completeness, and I agree with the above.  Forest Gleason, MD

## 2023-02-15 ENCOUNTER — Other Ambulatory Visit: Payer: Self-pay | Admitting: Dermatology

## 2023-03-01 ENCOUNTER — Ambulatory Visit: Payer: BC Managed Care – PPO | Admitting: Dermatology

## 2023-03-02 ENCOUNTER — Ambulatory Visit (INDEPENDENT_AMBULATORY_CARE_PROVIDER_SITE_OTHER): Payer: Medicare Other | Admitting: Dermatology

## 2023-03-02 VITALS — BP 158/84 | HR 74

## 2023-03-02 DIAGNOSIS — L570 Actinic keratosis: Secondary | ICD-10-CM

## 2023-03-02 DIAGNOSIS — L509 Urticaria, unspecified: Secondary | ICD-10-CM | POA: Diagnosis not present

## 2023-03-02 DIAGNOSIS — L219 Seborrheic dermatitis, unspecified: Secondary | ICD-10-CM | POA: Diagnosis not present

## 2023-03-02 NOTE — Progress Notes (Signed)
Follow-Up Visit   Subjective  Meghan Kerr is a 72 y.o. female who presents for the following: Rash (Started a couple of weeks ago - itchy and burns on the L back and above waist. Pt thinks rash is improving and then she'll wake up in the middle of the night scratching. She has tried TMC 0.1% cream but rash remains persistent. No new or changing detergents, soaps, medications, no recent illness or surgeries).   The following portions of the chart were reviewed this encounter and updated as appropriate:   Tobacco  Allergies  Meds  Problems  Med Hx  Surg Hx  Fam Hx      Review of Systems:  No other skin or systemic complaints except as noted in HPI or Assessment and Plan.  Objective  Well appearing patient in no apparent distress; mood and affect are within normal limits.  A focused examination was performed including the face and trunk. Relevant physical exam findings are noted in the Assessment and Plan.  Scalp Flaking of the post scalp.    R shoulder x 1 Erythematous thin papules/macules with gritty scale.   Back Edematous pink plaques at back with linear pink plaques secondary to pressure    Assessment & Plan  Seborrheic dermatitis Scalp  Chronic and persistent condition with duration or expected duration over one year. Condition is symptomatic/ bothersome to patient. Not currently at goal.  Seborrheic Dermatitis  -  is a chronic persistent rash characterized by pinkness and scaling most commonly of the mid face but also can occur on the scalp (dandruff), ears; mid chest, mid back and groin.  It tends to be exacerbated by stress and cooler weather.  People who have neurologic disease may experience new onset or exacerbation of existing seborrheic dermatitis.  The condition is not curable but treatable and can be controlled.  Recommend OTC color-safe anti-dandruff shampoo a 3d/wk or QW. May use TMC 0.1% cream to aa's post scalp BID PRN.   AK (actinic  keratosis) R shoulder x 1  Persistent   Actinic keratoses are precancerous spots that appear secondary to cumulative UV radiation exposure/sun exposure over time. They are chronic with expected duration over 1 year. A portion of actinic keratoses will progress to squamous cell carcinoma of the skin. It is not possible to reliably predict which spots will progress to skin cancer and so treatment is recommended to prevent development of skin cancer.  Recommend daily broad spectrum sunscreen SPF 30+ to sun-exposed areas, reapply every 2 hours as needed.  Recommend staying in the shade or wearing long sleeves, sun glasses (UVA+UVB protection) and wide brim hats (4-inch brim around the entire circumference of the hat). Call for new or changing lesions.  Prior to procedure, discussed risks of blister formation, small wound, skin dyspigmentation, or rare scar following cryotherapy. Recommend Vaseline ointment to treated areas while healing.   Destruction of lesion - R shoulder x 1 Complexity: simple   Destruction method: cryotherapy   Informed consent: discussed and consent obtained   Timeout:  patient name, date of birth, surgical site, and procedure verified Lesion destroyed using liquid nitrogen: Yes   Region frozen until ice ball extended beyond lesion: Yes   Outcome: patient tolerated procedure well with no complications   Post-procedure details: wound care instructions given    Urticaria Back  Urticaria and dermatographism  Unclear etiology. Pt has had a lot of stress planning up coming trip.   Start Zyrtec (cetirizine) 10 mg - take 1  tablet by mouth twice a day. If still getting rash and feeling ok may increase to 2 tablets at night and 1 in the morning. If still not clear, increase to 2 tablets twice a day.  If rash still present after using Zyrtec (cetirizine) 10 mg 2 tablets twice a day for a couple of days, please message on MyChart.   If rash is clear, continue medicine for a  month before tapering off dose (2 tablets at night and 1 in the morning for 2 days, then one tablet twice a day for 2 days, then 1 tablet daily for 2 days, then stop).  If rash clears but then comes back when you stop the medication, please call or message for a follow-up appointment.    Return for appointment as scheduled.  Luther Redo, CMA, am acting as scribe for Forest Gleason, MD .  Documentation: I have reviewed the above documentation for accuracy and completeness, and I agree with the above.  Forest Gleason, MD

## 2023-03-02 NOTE — Patient Instructions (Addendum)
Start Zyrtec (cetirizine) 10 mg - take 1 tablet by mouth twice a day. If still getting rash and feeling ok may increase to 2 tablets at night and 1 in the morning. If still not clear, increase to 2 tablets twice a day.  If rash still present after using Zyrtec (cetirizine) 10 mg 2 tablets twice a day for a couple of days, please message on MyChart.   If rash is clear, continue medicine for a month before tapering off dose (2 tablets at night and 1 in the morning for 2 days, then one tablet twice a day for 2 days, then 1 tablet daily for 2 days, then stop).  If rash clears but then comes back when you stop the medication, please call or message for a follow-up appointment.   Due to recent changes in healthcare laws, you may see results of your pathology and/or laboratory studies on MyChart before the doctors have had a chance to review them. We understand that in some cases there may be results that are confusing or concerning to you. Please understand that not all results are received at the same time and often the doctors may need to interpret multiple results in order to provide you with the best plan of care or course of treatment. Therefore, we ask that you please give [place] 2 business days to thoroughly review all your results before contacting the office for clarification. Should we see a critical lab result, you will be contacted sooner.   If You Need Anything After Your Visit  If you have any questions or concerns for your doctor, please call our main line at [phone] and press option 4 to reach your doctor's medical assistant. If no one answers, please leave a voicemail as directed and we will return your call as soon as possible. Messages left after 4 pm will be answered the following business day.   You may also send [place] a message via [place]. We typically respond to MyChart messages within 1-2 business days.  For prescription refills, please ask your pharmacy to contact our office. Our  fax number is [phone].  If you have an urgent issue when the clinic is closed that cannot wait until the next business day, you can page your doctor at the number below.    Please note that while we do our best to be available for urgent issues outside of office hours, we are not available 24/7.   If you have an urgent issue and are unable to reach [place], you may choose to seek medical care at your doctor's office, retail clinic, urgent care center, or emergency room.  If you have a medical emergency, please immediately call 911 or go to the emergency department.  Pager Numbers  - Dr. Nehemiah Massed: [phone]  - Dr. Laurence Ferrari: [phone]  - Dr. Nicole Kindred: [phone]  In the event of inclement weather, please call our main line at [phone] for an update on the status of any delays or closures.  Dermatology Medication Tips: Please keep the boxes that topical medications come in in order to help keep track of the instructions about where and how to use these. Pharmacies typically print the medication instructions only on the boxes and not directly on the medication tubes.   If your medication is too expensive, please contact our office at [phone] option 4 or send [place] a message through [place].   We are unable to tell what your co-pay for medications will be in advance as this is different  depending on your insurance coverage. However, we may be able to find a substitute medication at lower cost or fill out paperwork to get insurance to cover a needed medication.   If a prior authorization is required to get your medication covered by your insurance company, please allow [place] 1-2 business days to complete this process.  Drug prices often vary depending on where the prescription is filled and some pharmacies may offer cheaper prices.  The website www.goodrx.com contains coupons for medications through different pharmacies. The prices here do not account for what the cost may be with help  from insurance (it may be cheaper with your insurance), but the website can give you the price if you did not use any insurance.  - You can print the associated coupon and take it with your prescription to the pharmacy.  - You may also stop by our office during regular business hours and pick up a GoodRx coupon card.  - If you need your prescription sent electronically to a different pharmacy, notify our office through [facility] or by phone at [phone] option 4.     Si Usted Necesita Algo Despus de Su Visita  Tambin puede enviarnos un mensaje a travs de Pharmacist, community. Por lo general respondemos a los mensajes de MyChart en el transcurso de 1 a 2 das hbiles.  Para renovar recetas, por favor pida a su farmacia que se ponga en contacto con nuestra oficina. Harland Dingwall de fax es [place] [phone].  Si tiene un asunto urgente cuando la clnica est cerrada y que no puede esperar hasta el siguiente da hbil, puede llamar/localizar a su doctor(a) al nmero que aparece a continuacin.   Por favor, tenga en cuenta que aunque hacemos todo lo posible para estar disponibles para asuntos urgentes fuera del horario de [place], no estamos disponibles las 24 horas del da, los 7 das de la [place].   Si tiene un problema urgente y no puede comunicarse con nosotros, puede optar por buscar atencin mdica  en el consultorio de su doctor(a), en una clnica privada, en un centro de atencin urgente o en una sala de emergencias.  Si tiene Engineering geologist, por favor llame inmediatamente al 911 o vaya a la sala de emergencias.  Nmeros de bper  - Dr. Nehemiah Massed: [phone]  - Dra. Moye: [phone]  - Dra. Nicole Kindred: [phone]  En caso de inclemencias del [place], por favor llame a Johnsie Kindred principal al [phone] para una actualizacin sobre el [place] de cualquier retraso o cierre.  Consejos para la medicacin en dermatologa: Por favor, guarde las cajas en las que vienen los  medicamentos de uso tpico para ayudarle a seguir las instrucciones sobre dnde y cmo usarlos. Las farmacias generalmente imprimen las instrucciones del medicamento slo en las cajas y no directamente en los tubos del [place].   Si su medicamento es muy caro, por favor, pngase en contacto con Zigmund Daniel llamando al [phone] y presione la opcin 4 o envenos un mensaje a travs de Pharmacist, community.   No podemos decirle cul ser su copago por los medicamentos por adelantado ya que esto es diferente dependiendo de la cobertura de su seguro. Sin embargo, es posible que podamos encontrar un medicamento sustituto a Electrical engineer un formulario para que el seguro cubra el medicamento que se considera necesario.   Si se requiere una autorizacin previa para que su compaa de seguros [place] su medicamento, por favor permtanos de 1 a 2 das hbiles para [employer]  proceso.  Los precios de los medicamentos varan con frecuencia dependiendo del Environmental consultant de dnde se surte la receta y alguna farmacias pueden ofrecer precios ms baratos.  El sitio web www.goodrx.com tiene cupones para medicamentos de Airline pilot. Los precios aqu no tienen en cuenta lo que podra costar con la ayuda del seguro (puede ser ms barato con su seguro), pero el sitio web puede darle el precio si no utiliz Research scientist (physical sciences).  - Puede imprimir el cupn correspondiente y llevarlo con su receta a la farmacia.  - Tambin puede pasar por nuestra oficina durante el horario de atencin regular y Charity fundraiser una tarjeta de cupones de GoodRx.  - Si necesita que su receta se enve electrnicamente a una farmacia diferente, informe a nuestra oficina a travs de MyChart de [place] o por telfono llamando al [phone] y presione la opcin 4.

## 2023-03-04 ENCOUNTER — Other Ambulatory Visit: Payer: Self-pay | Admitting: Family Medicine

## 2023-03-04 NOTE — Telephone Encounter (Signed)
Last office visit 09/01/22 for CPE.  Last refilled ??.  Next appt: No future appointments with PCP.

## 2023-03-05 ENCOUNTER — Encounter: Payer: Self-pay | Admitting: Dermatology

## 2023-03-23 DIAGNOSIS — H35051 Retinal neovascularization, unspecified, right eye: Secondary | ICD-10-CM | POA: Diagnosis not present

## 2023-04-07 ENCOUNTER — Ambulatory Visit (INDEPENDENT_AMBULATORY_CARE_PROVIDER_SITE_OTHER): Payer: Medicare Other | Admitting: Dermatology

## 2023-04-07 ENCOUNTER — Encounter: Payer: Self-pay | Admitting: Dermatology

## 2023-04-07 VITALS — BP 128/73 | HR 54

## 2023-04-07 DIAGNOSIS — Z872 Personal history of diseases of the skin and subcutaneous tissue: Secondary | ICD-10-CM | POA: Diagnosis not present

## 2023-04-07 DIAGNOSIS — S0081XA Abrasion of other part of head, initial encounter: Secondary | ICD-10-CM

## 2023-04-07 DIAGNOSIS — L219 Seborrheic dermatitis, unspecified: Secondary | ICD-10-CM

## 2023-04-07 DIAGNOSIS — L509 Urticaria, unspecified: Secondary | ICD-10-CM

## 2023-04-07 DIAGNOSIS — L508 Other urticaria: Secondary | ICD-10-CM

## 2023-04-07 NOTE — Progress Notes (Signed)
   Follow-Up Visit   Subjective  Meghan Kerr is a 72 y.o. female who presents for the following: reports seb derm at scalp has improved, has noticed a spot at right chin area she would like checked, reports some itchy area at upper back and chest now. Does have a history urticaria and dermatographism Itchy areas at upper outer thighs.   Currently taking zyrtec to help treat.   The following portions of the chart were reviewed this encounter and updated as appropriate: medications, allergies, medical history  Review of Systems:  No other skin or systemic complaints except as noted in HPI or Assessment and Plan.  Objective  Well appearing patient in no apparent distress; mood and affect are within normal limits.  A focused examination was performed of the following areas: Scalp , chest, back, right shoulder    Assessment & Plan    Excoriation at right chin Exam: excoriation without features suspicious for malignancy on dermoscopy  Treatment Plan: Recommend vaseline and bandaid until healed. Recheck at f/u.  Seborrheic dermatitis Scalp Slight scale and erythema of the post scalp.   Chronic condition with duration or expected duration over one year. Currently well-controlled.   Seborrheic Dermatitis  -  is a chronic persistent rash characterized by pinkness and scaling most commonly of the mid face but also can occur on the scalp (dandruff), ears; mid chest, mid back and groin.  It tends to be exacerbated by stress and cooler weather.  People who have neurologic disease may experience new onset or exacerbation of existing seborrheic dermatitis.  The condition is not curable but treatable and can be controlled.   Treatment Continue OTC color-safe anti-dandruff shampoo a 3d/wk or QW. May use TMC 0.1% cream to aa's post scalp BID PRN for itch.   CHRONIC URTICARIA >6 weeks with dermatographism Exam  Erythematous edematous patches  Unclear etiology.   Improved but not  clear  Treatment Recommend increasing Zyrtec from 1 tab twice daily 1 tab in morning and then  2 tablets po qhs until next follow up  Recommend blood work CMP w/diff, TSH with Rfx and Sed Rate    HISTORY OF PRECANCEROUS ACTINIC KERATOSIS At right shoulder  - site(s) of PreCancerous Actinic Keratosis clear today. - these may recur and new lesions may form requiring treatment to prevent transformation into skin cancer - observe for new or changing spots and contact [facility] for appointment if occur - photoprotection with sun protective clothing; sunglasses and broad spectrum sunscreen with SPF of at least 30 + and frequent self skin exams recommended - yearly exams by a dermatologist recommended for persons with history of PreCancerous Actinic Keratoses  Return for 3 - 4 month follow up urticaria .  I, Asher Muir, CMA, am acting as scribe for Darden Dates, MD.   Documentation: I have reviewed the above documentation for accuracy and completeness, and I agree with the above.  Darden Dates, MD

## 2023-04-07 NOTE — Patient Instructions (Addendum)
Urticaria or hives is a pink to red patchy whelp- like rash of the skin that typically itches and it is the result of histamine release in the skin.   Hives may have multiple causes including stress, medications, infections, and systemic illness.  Sometimes there is a family history of chronic urticaria.   "Physical urticarias" may be caused by heat, sun, cold, vibration.   Insect bites can cause "papular urticaria". It is often difficult to find the cause of generalized hives.  Statistically, 70% of the time a cause of generalized hives is not found.  Sometimes hives can spontaneously resolve. Other times hives can persist and when it does, and no cause is found, and it has been at least 6 weeks since started, it is called "chronic idiopathic urticaria". Antihistamines are the mainstay for treatment.  In severe cases Xolair injections may be used.  Start Zyrtec 10 mg tab - Take 1 tablet by mouth in morning and take 2 tablets by mouth nightly.  Stay up until follow up    For bump at right chin  Apply Vaseline and bandaid to bump until healed.    Due to recent changes in healthcare laws, you may see results of your pathology and/or laboratory studies on MyChart before the doctors have had a chance to review them. We understand that in some cases there may be results that are confusing or concerning to you. Please understand that not all results are received at the same time and often the doctors may need to interpret multiple results in order to provide you with the best plan of care or course of treatment. Therefore, we ask that you please give [place] 2 business days to thoroughly review all your results before contacting the office for clarification. Should we see a critical lab result, you will be contacted sooner.   If You Need Anything After Your Visit  If you have any questions or concerns for your doctor, please call our main line at [phone] and press option 4 to reach your doctor's  medical assistant. If no one answers, please leave a voicemail as directed and we will return your call as soon as possible. Messages left after 4 pm will be answered the following business day.   You may also send [place] a message via MyChart. We typically respond to MyChart messages within 1-2 business days.  For prescription refills, please ask your pharmacy to contact our office. Our fax number is [phone].  If you have an urgent issue when the clinic is closed that cannot wait until the next business day, you can page your doctor at the number below.    Please note that while we do our best to be available for urgent issues outside of office hours, we are not available 24/7.   If you have an urgent issue and are unable to reach [place], you may choose to seek medical care at your doctor's office, retail clinic, urgent care center, or emergency room.  If you have a medical emergency, please immediately call 911 or go to the emergency department.  Pager Numbers  - Dr. Gwen Pounds: [phone]  - Dr. Neale Burly: [phone]  - Dr. Roseanne Reno: [phone]  In the event of inclement weather, please call our main line at [phone] for an update on the status of any delays or closures.  Dermatology Medication Tips: Please keep the boxes that topical medications come in in order to help keep track of the instructions about where and how to use these. Pharmacies  typically print the medication instructions only on the boxes and not directly on the medication tubes.   If your medication is too expensive, please contact our office at [phone] option 4 or send [place] a message through MyChart.   We are unable to tell what your co-pay for medications will be in advance as this is different depending on your insurance coverage. However, we may be able to find a substitute medication at lower cost or fill out paperwork to get insurance to cover a needed medication.   If a prior authorization is required to  get your medication covered by your insurance company, please allow [place] 1-2 business days to complete this process.  Drug prices often vary depending on where the prescription is filled and some pharmacies may offer cheaper prices.  The website www.goodrx.com contains coupons for medications through different pharmacies. The prices here do not account for what the cost may be with help from insurance (it may be cheaper with your insurance), but the website can give you the price if you did not use any insurance.  - You can print the associated coupon and take it with your prescription to the pharmacy.  - You may also stop by our office during regular business hours and pick up a GoodRx coupon card.  - If you need your prescription sent electronically to a different pharmacy, notify our office through [facility] or by phone at [phone] option 4.     Si Usted Necesita Algo Despus de Su Visita  Tambin puede enviarnos un mensaje a travs de Clinical cytogeneticist. Por lo general respondemos a los mensajes de MyChart en el transcurso de 1 a 2 das hbiles.  Para renovar recetas, por favor pida a su farmacia que se ponga en contacto con nuestra oficina. Annie Sable de fax es [place] [phone].  Si tiene un asunto urgente cuando la clnica est cerrada y que no puede esperar hasta el siguiente da hbil, puede llamar/localizar a su doctor(a) al nmero que aparece a continuacin.   Por favor, tenga en cuenta que aunque hacemos todo lo posible para estar disponibles para asuntos urgentes fuera del horario de [place], no estamos disponibles las 24 horas del da, los 7 [address]  [address] de la [place].   Si tiene un problema urgente y no puede comunicarse con nosotros, puede optar por buscar atencin mdica  en el consultorio de su doctor(a), en una clnica privada, en un centro de atencin urgente o en una sala de emergencias.  Si tiene Engineer, drilling, por favor llame inmediatamente al 911 o vaya a la sala de  emergencias.  Nmeros de bper  - Dr. Gwen Pounds: [phone]  - Dra. Moye: [phone]  - Dra. Roseanne Reno: [phone]  En caso de inclemencias del [place], por favor llame a Lacy Duverney principal al [phone] para una actualizacin sobre el [place] de cualquier retraso o cierre.  Consejos para la medicacin en dermatologa: Por favor, guarde las cajas en las que vienen los medicamentos de uso tpico para ayudarle a seguir las instrucciones sobre dnde y cmo usarlos. Las farmacias generalmente imprimen las instrucciones del medicamento slo en las cajas y no directamente en los tubos del [place].   Si su medicamento es muy caro, por favor, pngase en contacto con Rolm Gala llamando al [phone] y presione la opcin 4 o envenos un mensaje a travs de Clinical cytogeneticist.   No podemos decirle cul ser su copago por los medicamentos por adelantado ya que esto es diferente dependiendo de la [place] de  su seguro. Sin embargo, es posible que podamos encontrar un medicamento sustituto a Audiological scientistmenor costo o llenar un formulario para que el seguro cubra el medicamento que se considera necesario.   Si se requiere una autorizacin previa para que su compaa de seguros [place] su medicamento, por favor permtanos de 1 a 2 das hbiles para completar [address] proceso.  Los precios de los medicamentos varan con frecuencia dependiendo del Environmental consultantlugar de dnde se surte la receta y alguna farmacias pueden ofrecer precios ms baratos.  El sitio web www.goodrx.com tiene cupones para medicamentos de Health and safety inspectordiferentes farmacias. Los precios aqu no tienen en cuenta lo que podra costar con la ayuda del seguro (puede ser ms barato con su seguro), pero el sitio web puede darle el precio si no utiliz Tourist information centre managerningn seguro.  - Puede imprimir el cupn correspondiente y llevarlo con su receta a la farmacia.  - Tambin puede pasar por nuestra oficina durante el horario de atencin regular y Education officer, museumrecoger una tarjeta de cupones de GoodRx.  - Si  necesita que su receta se enve electrnicamente a una farmacia diferente, informe a nuestra oficina a travs de MyChart de [place] o por telfono llamando al [phone] y presione la opcin 4.

## 2023-04-08 LAB — CBC WITH DIFFERENTIAL/PLATELET
Basophils Absolute: 0 10*3/uL (ref 0.0–0.2)
Basos: 0 %
EOS (ABSOLUTE): 0.4 10*3/uL (ref 0.0–0.4)
Eos: 6 %
Hematocrit: 41.4 % (ref 34.0–46.6)
Hemoglobin: 13.9 g/dL (ref 11.1–15.9)
Immature Grans (Abs): 0 10*3/uL (ref 0.0–0.1)
Immature Granulocytes: 0 %
Lymphocytes Absolute: 1.3 10*3/uL (ref 0.7–3.1)
Lymphs: 17 %
MCH: 29.8 pg (ref 26.6–33.0)
MCHC: 33.6 g/dL (ref 31.5–35.7)
MCV: 89 fL (ref 79–97)
Monocytes Absolute: 0.6 10*3/uL (ref 0.1–0.9)
Monocytes: 8 %
Neutrophils Absolute: 5.1 10*3/uL (ref 1.4–7.0)
Neutrophils: 69 %
Platelets: 285 10*3/uL (ref 150–450)
RBC: 4.66 x10E6/uL (ref 3.77–5.28)
RDW: 11.9 % (ref 11.7–15.4)
WBC: 7.5 10*3/uL (ref 3.4–10.8)

## 2023-04-08 LAB — TSH RFX ON ABNORMAL TO FREE T4: TSH: 1.54 u[IU]/mL (ref 0.450–4.500)

## 2023-04-08 LAB — SEDIMENTATION RATE: Sed Rate: 4 mm/hr (ref 0–40)

## 2023-04-12 ENCOUNTER — Telehealth: Payer: Self-pay

## 2023-04-12 NOTE — Telephone Encounter (Signed)
-----   Message from Sandi Mealy, MD sent at 04/12/2023  2:44 PM EDT ----- Labs ok, no treatment needed other than the zyrtec for hives at this time  MAs please call. Thank you!

## 2023-04-12 NOTE — Telephone Encounter (Signed)
Advised patient: Labs ok, no treatment needed other than the zyrtec for hives at this time. Patient voiced understanding. C/B if continuing to flare with hives.

## 2023-04-20 DIAGNOSIS — H52223 Regular astigmatism, bilateral: Secondary | ICD-10-CM | POA: Diagnosis not present

## 2023-05-25 DIAGNOSIS — K080 Exfoliation of teeth due to systemic causes: Secondary | ICD-10-CM | POA: Diagnosis not present

## 2023-06-09 ENCOUNTER — Other Ambulatory Visit
Admission: RE | Admit: 2023-06-09 | Discharge: 2023-06-09 | Disposition: A | Discharge status: Home / Self Care | Payer: Medicare Other | Source: Ambulatory Visit | Attending: Ophthalmology | Admitting: Ophthalmology

## 2023-06-09 DIAGNOSIS — G4452 New daily persistent headache (NDPH): Secondary | ICD-10-CM | POA: Insufficient documentation

## 2023-06-09 DIAGNOSIS — H50111 Monocular exotropia, right eye: Secondary | ICD-10-CM | POA: Diagnosis not present

## 2023-06-09 DIAGNOSIS — H3581 Retinal edema: Secondary | ICD-10-CM | POA: Diagnosis not present

## 2023-06-09 DIAGNOSIS — H5316 Psychophysical visual disturbances: Secondary | ICD-10-CM | POA: Diagnosis not present

## 2023-06-09 LAB — CBC WITH DIFFERENTIAL/PLATELET
Abs Immature Granulocytes: 0.02 10*3/uL (ref 0.00–0.07)
Basophils Absolute: 0 10*3/uL (ref 0.0–0.1)
Basophils Relative: 0 %
Eosinophils Absolute: 0.2 10*3/uL (ref 0.0–0.5)
Eosinophils Relative: 3 %
HCT: 41.1 % (ref 36.0–46.0)
Hemoglobin: 13.3 g/dL (ref 12.0–15.0)
Immature Granulocytes: 0 %
Lymphocytes Relative: 22 %
Lymphs Abs: 1.3 10*3/uL (ref 0.7–4.0)
MCH: 29.2 pg (ref 26.0–34.0)
MCHC: 32.4 g/dL (ref 30.0–36.0)
MCV: 90.3 fL (ref 80.0–100.0)
Monocytes Absolute: 0.5 10*3/uL (ref 0.1–1.0)
Monocytes Relative: 9 %
Neutro Abs: 3.6 10*3/uL (ref 1.7–7.7)
Neutrophils Relative %: 66 %
Platelets: 259 10*3/uL (ref 150–400)
RBC: 4.55 MIL/uL (ref 3.87–5.11)
RDW: 12.2 % (ref 11.5–15.5)
WBC: 5.7 10*3/uL (ref 4.0–10.5)
nRBC: 0 % (ref 0.0–0.2)

## 2023-06-09 LAB — C-REACTIVE PROTEIN: CRP: 0.6 mg/dL (ref <1.0)

## 2023-06-09 LAB — SEDIMENTATION RATE: Sed Rate: 8 mm/hr (ref 0–30)

## 2023-06-22 DIAGNOSIS — H3581 Retinal edema: Secondary | ICD-10-CM | POA: Diagnosis not present

## 2023-06-22 DIAGNOSIS — H35051 Retinal neovascularization, unspecified, right eye: Secondary | ICD-10-CM | POA: Diagnosis not present

## 2023-07-27 ENCOUNTER — Ambulatory Visit (INDEPENDENT_AMBULATORY_CARE_PROVIDER_SITE_OTHER): Payer: Medicare Other

## 2023-07-27 VITALS — Ht 64.5 in | Wt 156.4 lb

## 2023-07-27 DIAGNOSIS — Z780 Asymptomatic menopausal state: Secondary | ICD-10-CM

## 2023-07-27 DIAGNOSIS — Z1231 Encounter for screening mammogram for malignant neoplasm of breast: Secondary | ICD-10-CM | POA: Diagnosis not present

## 2023-07-27 DIAGNOSIS — Z0000 Encounter for general adult medical examination without abnormal findings: Secondary | ICD-10-CM | POA: Diagnosis not present

## 2023-07-27 DIAGNOSIS — Z1211 Encounter for screening for malignant neoplasm of colon: Secondary | ICD-10-CM | POA: Diagnosis not present

## 2023-07-27 NOTE — Progress Notes (Signed)
Subjective:   Meghan Kerr is a 72 y.o. female who presents for Medicare Annual (Subsequent) preventive examination.  Visit Complete: Virtual  I connected with  Meghan Kerr on 07/27/23 by a audio enabled telemedicine application and verified that I am speaking with the correct person using two identifiers.  Patient Location: Home  Provider Location: Home Office  I discussed the limitations of evaluation and management by telemedicine. The patient expressed understanding and agreed to proceed.  Patient Medicare AWV questionnaire was completed by the patient on 07/26/23; I have confirmed that all information answered by patient is correct and no changes since this date.  Vital Signs: Unable to obtain new vitals due to this being a telehealth visit.   Review of Systems      Cardiac Risk Factors include: hypertension;dyslipidemia;advanced age (>31men, >3 women)     Objective:    Today's Vitals   07/27/23 1358  Weight: 156 lb 6.4 oz (70.9 kg)  Height: 5' 4.5" (1.638 m)   Body mass index is 26.43 kg/m.     07/27/2023    2:05 PM 07/21/2022    1:30 PM 07/20/2021    1:22 PM 07/17/2020    9:46 AM 06/20/2019   10:16 AM 06/08/2018    8:27 AM 12/03/2016   11:07 AM  Advanced Directives  Does Patient Have a Medical Advance Directive? Yes No No No No No Yes  Type of Estate agent of [place];Living will    --  Healthcare Power of [place];Living will  Does patient want to make changes to medical advance directive?     No - Patient declined    Copy of Healthcare Power of Attorney in Chart? No - copy requested      No - copy requested  Would patient like information on creating a medical advance directive?  No - Patient declined No - Patient declined Yes (MAU/Ambulatory/Procedural Areas - Information given) No - Patient declined Yes (MAU/Ambulatory/Procedural Areas - Information given)     Current Medications (verified) Outpatient Encounter Medications as of  07/27/2023  Medication Sig   amLODipine (NORVASC) 5 MG tablet TAKE 1 TABLET BY MOUTH EVERY DAY   Apoaequorin (PREVAGEN PO) Take 1 tablet by mouth daily.   Blood Glucose Monitoring Suppl (ONE TOUCH ULTRA MINI) w/Device KIT USE TO CHECK BLOOD SUGAR DAILY   glucose blood (ONETOUCH ULTRA) test strip USE TO CHECK BLOOD SUGAR DAILY   Lancets (ONETOUCH ULTRASOFT) lancets USE TO CHECK BLOOD SUGAR DAILY   rosuvastatin (CRESTOR) 5 MG tablet TAKE 1 TABLET BY MOUTH EVERY OTHER DAY   Vitamin D, Cholecalciferol, 10 MCG (400 UNIT) CHEW Chew 1 tablet by mouth daily.   clindamycin (CLINDAGEL) 1 % gel Apply once or twice daily to affected areas on scalp as needed   clobetasol (TEMOVATE) 0.05 % external solution APPLY TWICE DAILY AS NEEDED TO AFFECTED AREAS FOR TWO WEEKS, THEN APPLY TWICE DAILY ON WEEKENDS ONLY, AVOID APPLYING TO FACE, GROIN, AND AXILLA. USE AS DIRECTED. RISK OF SKIN ATROPHY WITH LONG-TERM USE REVIEWED.   nirmatrelvir & ritonavir (PAXLOVID, 300/100,) 20 x 150 MG & 10 x 100MG  TBPK Follow packaging directions for morning and evening doses.   triamcinolone cream (KENALOG) 0.5 % APPLY TO AFFECTED AREA TWICE A DAY   venlafaxine XR (EFFEXOR-XR) 37.5 MG 24 hr capsule TAKE 1 CAPSULE BY MOUTH DAILY WITH BREAKFAST.   No facility-administered encounter medications on file as of 07/27/2023.    Allergies (verified) Amoxicillin, Penicillins, Sulfa antibiotics, and Sulfonamide derivatives  History: Past Medical History:  Diagnosis Date   Hyperlipidemia    Hypertension    Past Surgical History:  Procedure Laterality Date   APPENDECTOMY     ECTOPIC PREGNANCY SURGERY  1976   HAMMER TOE SURGERY Left 08/19/2017   hardware removed April 2019   TRIGGER FINGER RELEASE  2006   WRIST FRACTURE SURGERY     broken left wrist repair with plate   Family History  Problem Relation Age of Onset   Diabetes Mother    Heart disease Mother        cad and chf   Heart disease Father        heart attack (massive)    Cancer Maternal Grandmother        COLON   Stroke Maternal Grandfather    Diabetes Brother    Heart disease Brother 60       hert attack   Heart disease Brother 78       CABG    Stroke Sister    Heart disease Sister        VALVE REPLACEMENT   Breast cancer Sister    Breast cancer Cousin 8   Social History   Socioeconomic History   Marital status: Married    Spouse name: Not on file   Number of children: Not on file   Years of education: Not on file   Highest education level: Not on file  Occupational History   Occupation: TEACHER    Comment: Kindergarten  Tobacco Use   Smoking status: Never   Smokeless tobacco: Never  Vaping Use   Vaping status: Never Used  Substance and Sexual Activity   Alcohol use: No   Drug use: No   Sexual activity: Not Currently  Other Topics Concern   Not on file  Social History Narrative   Not on file   Social Determinants of Health   Financial Resource Strain: Low Risk  (07/27/2023)   Overall Financial Resource Strain (CARDIA)    Difficulty of Paying Living Expenses: Not hard at all  Food Insecurity: No Food Insecurity (07/27/2023)   Hunger Vital Sign    Worried About Running Out of Food in the Last Year: Never true    Ran Out of Food in the Last Year: Never true  Transportation Needs: No Transportation Needs (07/27/2023)   PRAPARE - Administrator, Civil Service (Medical): No    Lack of Transportation (Non-Medical): No  Physical Activity: Insufficiently Active (07/27/2023)   Exercise Vital Sign    Days of Exercise per Week: 4 days    Minutes of Exercise per Session: 30 min  Stress: No Stress Concern Present (07/27/2023)   [employer] of Occupational Health - Occupational Stress Questionnaire    Feeling of Stress : Not at all  Social Connections: Socially Integrated (07/27/2023)   Social Connection and Isolation Panel [NHANES]    Frequency of Communication with Friends and Family: More than three times a week     Frequency of Social Gatherings with Friends and Family: More than three times a week    Attends Religious Services: More than 4 times per year    Active Member of [employer] or Organizations: Yes    Attends Engineer, structural: More than 4 times per year    Marital Status: Married    Tobacco Counseling Counseling given: Not Answered   Clinical Intake:  Pre-visit preparation completed: Yes  Pain : No/denies pain     BMI - recorded: 26.43  Nutritional Status: BMI 25 -29 Overweight Nutritional Risks: None Diabetes: No  How often do you need to have someone help you when you read instructions, pamphlets, or other written materials from your doctor or pharmacy?: 2 - Rarely  Interpreter Needed?: No  Information entered by :: C.Chane Magner LPN   Activities of Daily Living    07/26/2023    7:45 PM  In your present state of health, do you have any difficulty performing the following activities:  Hearing? 0  Vision? 1  Comment Due to scar tissue on retina  Difficulty concentrating or making decisions? 0  Walking or climbing stairs? 0  Dressing or bathing? 0  Doing errands, shopping? 0  Preparing Food and eating ? N  Using the Toilet? N  In the past six months, have you accidently leaked urine? N  Do you have problems with loss of bowel control? N  Managing your Medications? N  Managing your Finances? N  Housekeeping or managing your Housekeeping? N    Patient Care Team: Excell Seltzer, MD as PCP - General Brooke Dare Earl Gala, MD as Consulting Physician (Ophthalmology)  Indicate any recent Medical Services you may have received from other than [facility] providers in the past year (date may be approximate).     Assessment:   This is a routine wellness examination for Meghan Kerr.  Hearing/Vision screen Hearing Screening - Comments:: Denies hearing difficulties   Vision Screening - Comments:: Glasses - [facility] exams - UTD on exams  Dietary issues and exercise activities  discussed:     Goals Addressed             This Visit's Progress    Patient Stated       Live to age 51.       Depression Screen    07/27/2023    2:05 PM 07/27/2023    2:04 PM 09/21/2022   10:46 AM 07/21/2022    1:25 PM 04/06/2022    2:39 PM 02/23/2022   10:25 AM 07/20/2021    1:27 PM  PHQ 2/9 Scores  PHQ - 2 Score 0 0 1 0 1 0 0  PHQ- 9 Score   3  2  0    Fall Risk    07/26/2023    7:45 PM 07/21/2022    1:29 PM 02/23/2022   10:25 AM 07/20/2021    1:26 PM 07/17/2020    9:49 AM  Fall Risk   Falls in the past year? 0 0 0 1 0  Number falls in past yr: 0 0 0 1 0  Injury with Fall? 0 0 0 0 0  Risk for fall due to : No Fall Risks No Fall Risks No Fall Risks Medication side effect Medication side effect  Follow up Falls prevention discussed;Falls evaluation completed  Falls evaluation completed Falls evaluation completed;Falls prevention discussed Falls evaluation completed;Falls prevention discussed    MEDICARE RISK AT HOME:   TIMED UP AND GO:  Was the test performed?  No    Cognitive Function:    07/20/2021    1:34 PM 07/17/2020    9:52 AM 06/20/2019   10:30 AM 06/08/2018    8:27 AM  MMSE - Mini Mental State Exam  Orientation to time 5 5 5 5   Orientation to Place 5 5 5 5   Registration 3 3 3 3   Attention/ Calculation 5 5 0 0  Recall 3 3 3 3   Language- name 2 objects   0 0  Language- repeat 1 1  1 1  Language- follow 3 step command   0 3  Language- read & follow direction   0 0  Write a sentence   0 0  Copy design   0 0  Total score   17 20        07/27/2023    2:08 PM 07/21/2022    1:30 PM  6CIT Screen  What Year? 0 points 0 points  What month? 0 points 0 points  What time? 0 points 0 points  Count back from 20 0 points 0 points  Months in reverse 0 points 0 points  Repeat phrase 0 points 0 points  Total Score 0 points 0 points    Immunizations Immunization History  Administered Date(s) Administered   Fluad Quad(high Dose 65+) 08/22/2019, 09/18/2021,  09/21/2022   Influenza Whole 10/24/2008, 10/23/2010   Influenza,inj,Quad PF,6+ Mos 09/13/2014, 01/15/2015, 12/01/2015, 12/03/2016, 10/26/2017, 10/03/2018   PFIZER(Purple Top)SARS-COV-2 Vaccination 02/01/2020, 02/22/2020, 09/29/2020, 07/10/2021   Pneumococcal Conjugate-13 09/13/2014   Pneumococcal Polysaccharide-23 10/24/2008, 01/15/2015   Td 12/27/2005    TDAP status: Due, Education has been provided regarding the importance of this vaccine. Advised may receive this vaccine at local pharmacy or Health Dept. Aware to provide a copy of the vaccination record if obtained from local pharmacy or Health Dept. Verbalized acceptance and understanding.  Flu Vaccine status: Up to date  Pneumococcal vaccine status: Up to date  Covid-19 vaccine status: Information provided on how to obtain vaccines.   Qualifies for Shingles Vaccine? Yes   Zostavax completed No   Shingrix Completed?: No.    Education has been provided regarding the importance of this vaccine. Patient has been advised to call insurance company to determine out of pocket expense if they have not yet received this vaccine. Advised may also receive vaccine at local pharmacy or Health Dept. Verbalized acceptance and understanding.  Screening Tests Health Maintenance  Topic Date Due   DTaP/Tdap/Td (2 - Tdap) 12/28/2015   HEMOGLOBIN A1C  03/22/2023   Diabetic kidney evaluation - Urine ACR  04/07/2023   OPHTHALMOLOGY EXAM  08/17/2023 (Originally 11/11/2022)   COVID-19 Vaccine (5 - 2023-24 season) 08/17/2023 (Originally 08/27/2022)   Zoster Vaccines- Shingrix (1 of 2) 10/27/2023 (Originally 08/11/1970)   INFLUENZA VACCINE  07/28/2023   Fecal DNA (Cologuard)  08/14/2023   FOOT EXAM  09/07/2023   DEXA SCAN  09/07/2023   Diabetic kidney evaluation - eGFR measurement  09/22/2023   MAMMOGRAM  07/16/2024   Medicare Annual Wellness (AWV)  07/26/2024   Pneumonia Vaccine 86+ Years old  Completed   Hepatitis C Screening  Completed   HPV VACCINES   Aged Out    Health Maintenance  Health Maintenance Due  Topic Date Due   DTaP/Tdap/Td (2 - Tdap) 12/28/2015   HEMOGLOBIN A1C  03/22/2023   Diabetic kidney evaluation - Urine ACR  04/07/2023    Colorectal cancer screening: Type of screening: Cologuard. Completed 08/13/20. Repeat every 3 years, order placed 07/27/23  Mammogram status: Ordered 07/27/23. Pt provided with contact info and advised to call to schedule appt.   Bone Density status: Ordered 07/27/23. Pt provided with contact info and advised to call to schedule appt.  Lung Cancer Screening: (Low Dose CT Chest recommended if Age 29-80 years, 20 pack-year currently smoking OR have quit w/in 15years.) does not qualify.   Lung Cancer Screening Referral: no  Additional Screening:  Hepatitis C Screening: does qualify; Completed 11/28/15  Vision Screening: Recommended annual ophthalmology exams for early detection of glaucoma and  other disorders of the eye. Is the patient up to date with their annual eye exam?  Yes  Who is the provider or what is the name of the office in which the patient attends annual eye exams? [facility] If pt is not established with a provider, would they like to be referred to a provider to establish care? Yes .   Dental Screening: Recommended annual dental exams for proper oral hygiene   Community Resource Referral / Chronic Care Management: CRR required this visit?  No   CCM required this visit?  No     Plan:     I have personally reviewed and noted the following in the patient's chart:   Medical and social history Use of alcohol, tobacco or illicit drugs  Current medications and supplements including opioid prescriptions. Patient is not currently taking opioid prescriptions. Functional ability and status Nutritional status Physical activity Advanced directives List of other physicians Hospitalizations, surgeries, and ER visits in previous 12 months Vitals Screenings to include  cognitive, depression, and falls Referrals and appointments  In addition, I have reviewed and discussed with patient certain preventive protocols, quality metrics, and best practice recommendations. A written personalized care plan for preventive services as well as general preventive health recommendations were provided to patient.     Maryan Puls, LPN   08/06/9146   After Visit Summary: (MyChart) Due to this being a telephonic visit, the after visit summary with patients personalized plan was offered to patient via MyChart   Nurse Notes: Eye exam results requested from [facility]. Orders have been placed for Cologuard, mammogram and dexa scan.

## 2023-07-27 NOTE — Patient Instructions (Addendum)
Meghan Kerr , Thank you for taking time to come for your Medicare Wellness Visit. I appreciate your ongoing commitment to your health goals. Please review the following plan we discussed and let me know if I can assist you in the future.   Referrals/Orders/Follow-Ups/Clinician Recommendations: Aim for 30 minutes of exercise or brisk walking, 6-8 glasses of water, and 5 servings of fruits and vegetables each day.    You have an order for:  []   2D Mammogram  [x]   3D Mammogram  [x]   Bone Density     Please call for appointment:  [facility]  [address] [place] [address] [phone]  [facility] [address] [place], [place] [address] [phone]  [facility] [address] Geanie Logan [place], [place] [address] [phone]    Make sure to wear two-piece clothing.  No lotions, powders, or deodorants the day of the appointment. Make sure to bring picture ID and insurance card.  Bring list of medications you are currently taking including any supplements.   Schedule your  screening mammogram through MyChart!   Log into your MyChart account.  Go to 'Visit' (or 'Appointments' if on mobile App) --> Schedule an Appointment  Under 'Select a Reason for Visit' choose the Mammogram Screening option.  Complete the pre-visit questions and select the time and place that best fits your schedule.    This is a list of the screening recommended for you and due dates:  Health Maintenance  Topic Date Due   Zoster (Shingles) Vaccine (1 of 2) Never done   DTaP/Tdap/Td vaccine (2 - Tdap) 12/28/2015   COVID-19 Vaccine (5 - 2023-24 season) 08/27/2022   Eye exam for diabetics  11/11/2022   Hemoglobin A1C  03/22/2023   Yearly kidney health urinalysis for diabetes  04/07/2023   Medicare Annual Wellness Visit  07/22/2023   Flu Shot  07/28/2023    Cologuard (Stool DNA test)  08/14/2023   Complete foot exam   09/07/2023   DEXA scan (bone density measurement)  09/07/2023   Yearly kidney function blood test for diabetes  09/22/2023   Mammogram  07/16/2024   Pneumonia Vaccine  Completed   Hepatitis C Screening  Completed   HPV Vaccine  Aged Out    Advanced directives: (Copy Requested) Please bring a copy of your health care power of attorney and living will to the office to be added to your chart at your convenience.  Next Medicare Annual Wellness Visit scheduled for next year: Yes  Preventive Care 28 Years and Older, Female Preventive care refers to lifestyle choices and visits with your health care provider that can promote health and wellness. What does preventive care include? A yearly physical exam. This is also called an annual well check. Dental exams once or twice a year. Routine eye exams. Ask your health care provider how often you should have your eyes checked. Personal lifestyle choices, including: Daily care of your teeth and gums. Regular physical activity. Eating a healthy diet. Avoiding tobacco and drug use. Limiting alcohol use. Practicing safe sex. Taking low-dose aspirin every day. Taking vitamin and mineral supplements as recommended by your health care provider. What happens during an annual well check? The services and screenings done by your health care provider during your annual well check will depend on your age, overall health, lifestyle risk factors, and family history of disease. Counseling  Your  health care provider may ask you questions about your: Alcohol use. Tobacco use. Drug use. Emotional well-being. Home and relationship well-being. Sexual activity. Eating habits. History of falls. Memory and ability to understand (cognition). Work and work Astronomer. Reproductive health. Screening  You may have the following tests or measurements: Height, weight, and BMI. Blood pressure. Lipid  and cholesterol levels. These may be checked every 5 years, or more frequently if you are over 74 years old. Skin check. Lung cancer screening. You may have this screening every year starting at age 47 if you have a 30-pack-year history of smoking and currently smoke or have quit within the past 15 years. Fecal occult blood test (FOBT) of the stool. You may have this test every year starting at age 8. Flexible sigmoidoscopy or colonoscopy. You may have a sigmoidoscopy every 5 years or a colonoscopy every 10 years starting at age 49. Hepatitis C blood test. Hepatitis B blood test. Sexually transmitted disease (STD) testing. Diabetes screening. This is done by checking your blood sugar (glucose) after you have not eaten for a while (fasting). You may have this done every 1-3 years. Bone density scan. This is done to screen for osteoporosis. You may have this done starting at age 40. Mammogram. This may be done every 1-2 years. Talk to your health care provider about how often you should have regular mammograms. Talk with your health care provider about your test results, treatment options, and if necessary, the need for more tests. Vaccines  Your health care provider may recommend certain vaccines, such as: Influenza vaccine. This is recommended every year. Tetanus, diphtheria, and acellular pertussis (Tdap, Td) vaccine. You may need a Td booster every 10 years. Zoster vaccine. You may need this after age 69. Pneumococcal 13-valent conjugate (PCV13) vaccine. One dose is recommended after age 64. Pneumococcal polysaccharide (PPSV23) vaccine. One dose is recommended after age 10. Talk to your health care provider about which screenings and vaccines you need and how often you need them. This information is not intended to replace advice given to you by your health care provider. Make sure you discuss any questions you have with your health care provider. Document Released: 01/09/2016 Document  Revised: 09/01/2016 Document Reviewed: 10/14/2015 Elsevier Interactive Patient Education  2017 [employer].  Fall Prevention in the Home Falls can cause injuries. They can happen to people of all ages. There are many things you can do to make your home safe and to help prevent falls. What can I do on the outside of my home? Regularly fix the edges of walkways and driveways and fix any cracks. Remove anything that might make you trip as you walk through a door, such as a raised step or threshold. Trim any bushes or trees on the path to your home. Use bright outdoor lighting. Clear any walking paths of anything that might make someone trip, such as rocks or tools. Regularly check to see if handrails are loose or broken. Make sure that both sides of any steps have handrails. Any raised decks and porches should have guardrails on the edges. Have any leaves, snow, or ice cleared regularly. Use sand or salt on walking paths during winter. Clean up any spills in your garage right away. This includes oil or grease spills. What can I do in the bathroom? Use night lights. Install grab bars by the toilet and in the tub and shower. Do not use towel bars as grab bars. Use non-skid mats or decals in the tub or  shower. If you need to sit down in the shower, use a plastic, non-slip stool. Keep the floor dry. Clean up any water that spills on the floor as soon as it happens. Remove soap buildup in the tub or shower regularly. Attach bath mats securely with double-sided non-slip rug tape. Do not have throw rugs and other things on the floor that can make you trip. What can I do in the bedroom? Use night lights. Make sure that you have a light by your bed that is easy to reach. Do not use any sheets or blankets that are too big for your bed. They should not hang down onto the floor. Have a firm chair that has side arms. You can use this for support while you get dressed. Do not have throw rugs and other  things on the floor that can make you trip. What can I do in the kitchen? Clean up any spills right away. Avoid walking on wet floors. Keep items that you use a lot in easy-to-reach places. If you need to reach something above you, use a strong step stool that has a grab bar. Keep electrical cords out of the way. Do not use floor polish or wax that makes floors slippery. If you must use wax, use non-skid floor wax. Do not have throw rugs and other things on the floor that can make you trip. What can I do with my stairs? Do not leave any items on the stairs. Make sure that there are handrails on both sides of the stairs and use them. Fix handrails that are broken or loose. Make sure that handrails are as long as the stairways. Check any carpeting to make sure that it is firmly attached to the stairs. Fix any carpet that is loose or worn. Avoid having throw rugs at the top or bottom of the stairs. If you do have throw rugs, attach them to the floor with carpet tape. Make sure that you have a light switch at the top of the stairs and the bottom of the stairs. If you do not have them, ask someone to add them for you. What else can I do to help prevent falls? Wear shoes that: Do not have high heels. Have rubber bottoms. Are comfortable and fit you well. Are closed at the toe. Do not wear sandals. If you use a stepladder: Make sure that it is fully opened. Do not climb a closed stepladder. Make sure that both sides of the stepladder are locked into place. Ask someone to hold it for you, if possible. Clearly mark and make sure that you can see: Any grab bars or handrails. First and last steps. Where the edge of each step is. Use tools that help you move around (mobility aids) if they are needed. These include: Canes. Walkers. Scooters. Crutches. Turn on the lights when you go into a dark area. Replace any light bulbs as soon as they burn out. Set up your furniture so you have a clear  path. Avoid moving your furniture around. If any of your floors are uneven, fix them. If there are any pets around you, be aware of where they are. Review your medicines with your doctor. Some medicines can make you feel dizzy. This can increase your chance of falling. Ask your doctor what other things that you can do to help prevent falls. This information is not intended to replace advice given to you by your health care provider. Make sure you discuss any questions you  have with your health care provider. Document Released: 10/09/2009 Document Revised: 05/20/2016 Document Reviewed: 01/17/2015 Elsevier Interactive Patient Education  2017 [employer].

## 2023-08-09 ENCOUNTER — Ambulatory Visit: Payer: BC Managed Care – PPO | Admitting: Dermatology

## 2023-08-20 ENCOUNTER — Other Ambulatory Visit: Payer: Self-pay | Admitting: Family Medicine

## 2023-08-22 DIAGNOSIS — Z1211 Encounter for screening for malignant neoplasm of colon: Secondary | ICD-10-CM | POA: Diagnosis not present

## 2023-08-22 NOTE — Telephone Encounter (Signed)
Last office visit 09/21/22 for CPE.  Last refilled 03/04/2023 for 30 g with no refills.  Next Appt: No future appointments with PCP.  Please schedule CPE with fasting labs prior with Dr. Ermalene Searing after 09/22/23.

## 2023-08-22 NOTE — Telephone Encounter (Signed)
Spoke to pt, scheduled cpe for 09/29/23

## 2023-08-24 ENCOUNTER — Telehealth: Payer: Self-pay | Admitting: Family Medicine

## 2023-08-24 DIAGNOSIS — E119 Type 2 diabetes mellitus without complications: Secondary | ICD-10-CM

## 2023-08-24 MED ORDER — ONETOUCH ULTRA VI STRP
ORAL_STRIP | 3 refills | Status: DC
Start: 2023-08-24 — End: 2024-06-19

## 2023-08-24 MED ORDER — ONETOUCH ULTRA MINI W/DEVICE KIT
PACK | 0 refills | Status: AC
Start: 2023-08-24 — End: ?

## 2023-08-24 MED ORDER — ONETOUCH ULTRASOFT LANCETS MISC
3 refills | Status: DC
Start: 2023-08-24 — End: 2024-06-19

## 2023-08-24 NOTE — Telephone Encounter (Signed)
Prescription Request  08/24/2023  LOV: 09/21/2022  What is the name of the medication or equipment? Blood Glucose Monitoring Suppl (ONE TOUCH ULTRA MINI) w/Device KIT  glucose blood (ONETOUCH ULTRA) test strip  Lancets (ONETOUCH ULTRASOFT) lancets    Have you contacted your pharmacy to request a refill? Yes   Which pharmacy would you like this sent to?  [id number] [place]ann Sheen, [place] - [address] [place] [address] Phone: [phone] Fax: [phone]     Patient notified that their request is being sent to the clinical staff for review and that they should receive a response within 2 business days.   Please advise at [facility] [phone]

## 2023-08-24 NOTE — Telephone Encounter (Signed)
Refills sent as requested

## 2023-08-31 DIAGNOSIS — H35051 Retinal neovascularization, unspecified, right eye: Secondary | ICD-10-CM | POA: Diagnosis not present

## 2023-08-31 DIAGNOSIS — H3581 Retinal edema: Secondary | ICD-10-CM | POA: Diagnosis not present

## 2023-09-09 ENCOUNTER — Telehealth: Payer: Self-pay | Admitting: *Deleted

## 2023-09-09 DIAGNOSIS — E119 Type 2 diabetes mellitus without complications: Secondary | ICD-10-CM

## 2023-09-09 DIAGNOSIS — E1169 Type 2 diabetes mellitus with other specified complication: Secondary | ICD-10-CM

## 2023-09-09 NOTE — Telephone Encounter (Signed)
-----   Message from Lovena Neighbours sent at 09/09/2023  3:25 PM EDT ----- Regarding: Labs for Thursday 9.26.24 Please put physical lab orders in future. Thank you, Denny Peon

## 2023-09-14 ENCOUNTER — Encounter: Payer: Self-pay | Admitting: Dermatology

## 2023-09-14 ENCOUNTER — Ambulatory Visit (INDEPENDENT_AMBULATORY_CARE_PROVIDER_SITE_OTHER): Payer: Medicare Other | Admitting: Dermatology

## 2023-09-14 VITALS — BP 129/69 | HR 57

## 2023-09-14 DIAGNOSIS — L219 Seborrheic dermatitis, unspecified: Secondary | ICD-10-CM | POA: Diagnosis not present

## 2023-09-14 DIAGNOSIS — L281 Prurigo nodularis: Secondary | ICD-10-CM

## 2023-09-14 DIAGNOSIS — L280 Lichen simplex chronicus: Secondary | ICD-10-CM

## 2023-09-14 DIAGNOSIS — T148XXA Other injury of unspecified body region, initial encounter: Secondary | ICD-10-CM

## 2023-09-14 DIAGNOSIS — S0031XA Abrasion of nose, initial encounter: Secondary | ICD-10-CM

## 2023-09-14 DIAGNOSIS — L309 Dermatitis, unspecified: Secondary | ICD-10-CM

## 2023-09-14 MED ORDER — FLUOCINOLONE ACETONIDE BODY 0.01 % EX OIL
TOPICAL_OIL | CUTANEOUS | 5 refills | Status: DC
Start: 2023-09-14 — End: 2024-01-28

## 2023-09-14 NOTE — Progress Notes (Signed)
   Follow-Up Visit   Subjective  MONIE STROZEWSKI is a 72 y.o. female who presents for the following: patient reports itchy areas at scalp and some itchy areas right upper arm with scarring. Also reports a bump at nasal tip.     The patient has spots, moles and lesions to be evaluated, some may be new or changing and the patient may have concern these could be cancer.   The following portions of the chart were reviewed this encounter and updated as appropriate: medications, allergies, medical history  Review of Systems:  No other skin or systemic complaints except as noted in HPI or Assessment and Plan.  Objective  Well appearing patient in no apparent distress; mood and affect are within normal limits.   A focused examination was performed of the following areas: Nose, face, right upper arm  Relevant exam findings are noted in the Assessment and Plan.    Assessment & Plan   PRURIGO NODULARIS/LICHEN SIMPLEX CHRONICUS Exam: Excoriated indurated scaly papule at right upper arm   Chronic and persistent condition with duration or expected duration over one year. Condition is symptomatic/ bothersome to patient. Not currently at goal.   Lichen simplex chronicus [facility]) is a persistent itchy area of thickened skin that is induced by chronic rubbing and/or scratching (chronic dermatitis).  These areas may be pink, hyperpigmented and may have excoriations and bumps (prurigo nodules-PN).  PN/LSC is commonly observed in uncontrolled atopic dermatitis and other forms of eczema, and in other itchy skin conditions (eg, insect bites, scabies).  Sometimes it is not possible to know initial cause of PN/LSC if it has been present for a long time.  It generally responds well to treatment with high potency topical steroids.  It is important to stop rubbing/scratching the area in order to break the itch-scratch-rash-itch cycle, in order for the rash to resolve.   Treatment Plan:  Avoid  picking/rubbing/scratching  Patient deferred treatment at this time.    SEBORRHEIC DERMATITIS Exam: Scaly pink papules coalescing to plaques at posterior scalp  Chronic and flaring  Seborrheic Dermatitis is a chronic persistent rash characterized by pinkness and scaling most commonly of the mid face but also can occur on the scalp (dandruff), ears; mid chest, mid back and groin.  It tends to be exacerbated by stress and cooler weather.  People who have neurologic disease may experience new onset or exacerbation of existing seborrheic dermatitis.  The condition is not curable but treatable and can be controlled.  Treatment Plan: Start Dermasmooth oil - apply topically to scalp twice daily as needed for itch   Open wound at nasal tip  Exam: excoriated papule on left medial alar rim  Treatment Plan: Recommend vaseline daily until resolved.    Dermatitis  Related Medications Fluocinolone Acetonide Body 0.01 % OIL Apply topically to itchy areas on scalp twice daily as needed.    Return if symptoms worsen or fail to improve.  I, Asher Muir, CMA, am acting as scribe for Elie Goody, MD.   Documentation: I have reviewed the above documentation for accuracy and completeness, and I agree with the above.  Elie Goody, MD

## 2023-09-14 NOTE — Patient Instructions (Signed)

## 2023-09-22 ENCOUNTER — Other Ambulatory Visit (INDEPENDENT_AMBULATORY_CARE_PROVIDER_SITE_OTHER): Payer: Medicare Other

## 2023-09-22 DIAGNOSIS — E785 Hyperlipidemia, unspecified: Secondary | ICD-10-CM | POA: Diagnosis not present

## 2023-09-22 DIAGNOSIS — E119 Type 2 diabetes mellitus without complications: Secondary | ICD-10-CM | POA: Diagnosis not present

## 2023-09-22 DIAGNOSIS — E1169 Type 2 diabetes mellitus with other specified complication: Secondary | ICD-10-CM | POA: Diagnosis not present

## 2023-09-22 LAB — COMPREHENSIVE METABOLIC PANEL
ALT: 10 U/L (ref 0–35)
AST: 15 U/L (ref 0–37)
Albumin: 4.1 g/dL (ref 3.5–5.2)
Alkaline Phosphatase: 85 U/L (ref 39–117)
BUN: 15 mg/dL (ref 6–23)
CO2: 27 mEq/L (ref 19–32)
Calcium: 9.4 mg/dL (ref 8.4–10.5)
Chloride: 106 mEq/L (ref 96–112)
Creatinine, Ser: 0.88 mg/dL (ref 0.40–1.20)
GFR: 64.88 mL/min (ref >60.00)
Glucose, Bld: 115 mg/dL — ABNORMAL HIGH (ref 70–99)
Potassium: 4.2 mEq/L (ref 3.5–5.1)
Sodium: 142 mEq/L (ref 135–145)
Total Bilirubin: 0.6 mg/dL (ref 0.2–1.2)
Total Protein: 6.7 g/dL (ref 6.0–8.3)

## 2023-09-22 LAB — LIPID PANEL
Cholesterol: 166 mg/dL (ref 0–200)
HDL: 61.8 mg/dL (ref >39.00)
LDL Cholesterol: 85 mg/dL (ref 0–99)
NonHDL: 103.99
Total CHOL/HDL Ratio: 3
Triglycerides: 95 mg/dL (ref 0.0–149.0)
VLDL: 19 mg/dL (ref 0.0–40.0)

## 2023-09-22 LAB — MICROALBUMIN / CREATININE URINE RATIO
Creatinine,U: 255.7 mg/dL
Microalb Creat Ratio: 0.9 mg/g (ref 0.0–30.0)
Microalb, Ur: 2.3 mg/dL — ABNORMAL HIGH (ref 0.0–1.9)

## 2023-09-22 LAB — HEMOGLOBIN A1C: Hgb A1c MFr Bld: 6.8 % — ABNORMAL HIGH (ref 4.6–6.5)

## 2023-09-22 NOTE — Progress Notes (Signed)
No critical labs need to be addressed urgently. We will discuss labs in detail at upcoming office visit.   

## 2023-09-29 ENCOUNTER — Ambulatory Visit: Payer: Medicare Other | Admitting: Family Medicine

## 2023-09-29 VITALS — BP 128/68 | HR 60 | Temp 98.5°F | Ht 64.5 in | Wt 153.6 lb

## 2023-09-29 DIAGNOSIS — I152 Hypertension secondary to endocrine disorders: Secondary | ICD-10-CM

## 2023-09-29 DIAGNOSIS — F3342 Major depressive disorder, recurrent, in full remission: Secondary | ICD-10-CM

## 2023-09-29 DIAGNOSIS — E1169 Type 2 diabetes mellitus with other specified complication: Secondary | ICD-10-CM | POA: Diagnosis not present

## 2023-09-29 DIAGNOSIS — E1159 Type 2 diabetes mellitus with other circulatory complications: Secondary | ICD-10-CM

## 2023-09-29 DIAGNOSIS — Z0000 Encounter for general adult medical examination without abnormal findings: Secondary | ICD-10-CM | POA: Diagnosis not present

## 2023-09-29 DIAGNOSIS — E785 Hyperlipidemia, unspecified: Secondary | ICD-10-CM

## 2023-09-29 DIAGNOSIS — Z23 Encounter for immunization: Secondary | ICD-10-CM

## 2023-09-29 LAB — HM DIABETES FOOT EXAM

## 2023-09-29 NOTE — Progress Notes (Signed)
Patient ID: Meghan Kerr, female    DOB: Sep 07, 1951, 72 y.o.   MRN: [id number]  This visit was conducted in person.  BP 128/68 (BP Location: Left Arm, Patient Position: Sitting, Cuff Size: Normal)   Pulse 60   Temp 98.5 F (36.9 C) (Oral)   Ht 5' 4.5" (1.638 m)   Wt 153 lb 9.6 oz (69.7 kg)   SpO2 96%   BMI 25.96 kg/m    CC:  Chief Complaint  Patient presents with   Annual Exam    Patient states no other concerns at the moment     Subjective:   HPI: Meghan Kerr is a 72 y.o. female presenting on 09/29/2023 for Annual Exam (Patient states no other concerns at the moment )  The patient presents for complete physical and review of chronic health problems. He/She also has the following acute concerns today: none  The patient saw a LPN or RN for medicare wellness visit. 06/2023  Prevention and wellness was reviewed in detail. Note reviewed and important notes copied below.  MDD/GAD:  She has successfully been able to wean off venlafaxine XR 37.5 mg daily Flowsheet Row Office Visit from 09/29/2023 in [facility]  PHQ-2 Total Score 1      Hypertension:  Well-controlled on amlodipine 5 mg p.o. daily BP Readings from Last 3 Encounters:  09/29/23 128/68  09/14/23 129/69  04/07/23 128/73  Using medication without problems or lightheadedness:  occ Chest pain with exertion: none Edema: none Short of breath: none Average home BPs: Other issues: Wt Readings from Last 3 Encounters:  09/29/23 153 lb 9.6 oz (69.7 kg)  07/27/23 156 lb 6.4 oz (70.9 kg)  11/16/22 163 lb 12.8 oz (74.3 kg)     Diabetes: Diet controlled in past  Lab Results  Component Value Date   HGBA1C 6.8 (H) 09/22/2023  Using medications without difficulties: Hypoglycemic episodes: Hyperglycemic episodes:none Feet problems: no ulcers Blood Sugars averaging: occ chekcing FBS 88-131 eye exam within last year: yes per Dr. Elige Kerr    Elevated Cholesterol: LDL at  goal on rosuvastatin 5 mg p.o. every other day  Lab Results  Component Value Date   CHOL 166 09/22/2023   HDL 61.80 09/22/2023   LDLCALC 85 09/22/2023   LDLDIRECT 190.1 07/15/2011   TRIG 95.0 09/22/2023   CHOLHDL 3 09/22/2023  Using medications without problems: no SE. Muscle aches:  Diet compliance: fruits and veggies Exercise: [employer] 1-2x weekly Other complaints:   Relevant past medical, surgical, family and social history reviewed and updated as indicated. Interim medical history since our last visit reviewed. Allergies and medications reviewed and updated. Outpatient Medications Prior to Visit  Medication Sig Dispense Refill   amLODipine (NORVASC) 5 MG tablet TAKE 1 TABLET BY MOUTH EVERY DAY 90 tablet 3   Apoaequorin (PREVAGEN PO) Take 1 tablet by mouth daily.     Blood Glucose Monitoring Suppl (ONE TOUCH ULTRA MINI) w/Device KIT USE TO CHECK BLOOD SUGAR DAILY 1 kit 0   Fluocinolone Acetonide Body 0.01 % OIL Apply topically to itchy areas on scalp twice daily as needed. 120 mL 5   glucose blood (ONETOUCH ULTRA) test strip USE TO CHECK BLOOD SUGAR DAILY 100 each 3   Lancets (ONETOUCH ULTRASOFT) lancets USE TO CHECK BLOOD SUGAR DAILY 100 each 3   rosuvastatin (CRESTOR) 5 MG tablet TAKE 1 TABLET BY MOUTH EVERY OTHER DAY 45 tablet 3   triamcinolone cream (KENALOG) 0.5 % APPLY TO AFFECTED  AREA TWICE A DAY 30 g 0   Vitamin D, Cholecalciferol, 10 MCG (400 UNIT) CHEW Chew 1 tablet by mouth daily.     No facility-administered medications prior to visit.     Per HPI unless specifically indicated in ROS section below Review of Systems  Constitutional:  Negative for fatigue and fever.  HENT:  Negative for congestion.   Eyes:  Negative for pain.  Respiratory:  Negative for cough and shortness of breath.   Cardiovascular:  Negative for chest pain, palpitations and leg swelling.  Gastrointestinal:  Negative for abdominal pain.  Genitourinary:  Negative for dysuria and vaginal bleeding.   Musculoskeletal:  Negative for back pain.  Neurological:  Negative for syncope, light-headedness and headaches.  Psychiatric/Behavioral:  Negative for dysphoric mood.    Objective:  BP 128/68 (BP Location: Left Arm, Patient Position: Sitting, Cuff Size: Normal)   Pulse 60   Temp 98.5 F (36.9 C) (Oral)   Ht 5' 4.5" (1.638 m)   Wt 153 lb 9.6 oz (69.7 kg)   SpO2 96%   BMI 25.96 kg/m   Wt Readings from Last 3 Encounters:  09/29/23 153 lb 9.6 oz (69.7 kg)  07/27/23 156 lb 6.4 oz (70.9 kg)  11/16/22 163 lb 12.8 oz (74.3 kg)      Physical Exam Vitals and nursing note reviewed.  Constitutional:      General: She is not in acute distress.    Appearance: Normal appearance. She is well-developed. She is not ill-appearing or toxic-appearing.  HENT:     Head: Normocephalic.     Right Ear: Hearing, tympanic membrane, ear canal and external ear normal.     Left Ear: Hearing, tympanic membrane, ear canal and external ear normal.     Nose: Nose normal.  Eyes:     General: Lids are normal. Lids are everted, no foreign bodies appreciated.     Conjunctiva/sclera: Conjunctivae normal.     Pupils: Pupils are equal, round, and reactive to light.  Neck:     Thyroid: No thyroid mass or thyromegaly.     Vascular: No carotid bruit.     Trachea: Trachea normal.  Cardiovascular:     Rate and Rhythm: Normal rate and regular rhythm.     Heart sounds: Normal heart sounds, S1 normal and S2 normal. No murmur heard.    No gallop.  Pulmonary:     Effort: Pulmonary effort is normal. No respiratory distress.     Breath sounds: Normal breath sounds. No wheezing, rhonchi or rales.  Abdominal:     General: Bowel sounds are normal. There is no distension or abdominal bruit.     Palpations: Abdomen is soft. There is no fluid wave or mass.     Tenderness: There is no abdominal tenderness. There is no guarding or rebound.     Hernia: No hernia is present.  Musculoskeletal:     Cervical back: Normal range of  motion and neck supple.  Lymphadenopathy:     Cervical: No cervical adenopathy.  Skin:    General: Skin is warm and dry.     Findings: No rash.  Neurological:     Mental Status: She is alert.     Cranial Nerves: No cranial nerve deficit.     Sensory: No sensory deficit.  Psychiatric:        Mood and Affect: Mood is not anxious or depressed.        Speech: Speech normal.        Behavior: Behavior  normal. Behavior is cooperative.        Judgment: Judgment normal.   Bilateral varicose veins.  Diabetic foot exam: Normal inspection No skin breakdown No calluses  Normal DP pulses Normal sensation to light touch and monofilament Nails normal     Results for orders placed or performed in visit on 09/22/23  Microalbumin / creatinine urine ratio  Result Value Ref Range   Microalb, Ur 2.3 (H) 0.0 - 1.9 mg/dL   Creatinine,U 098.1 mg/dL   Microalb Creat Ratio 0.9 0.0 - 30.0 mg/g  Comprehensive metabolic panel  Result Value Ref Range   Sodium 142 135 - 145 mEq/L   Potassium 4.2 3.5 - 5.1 mEq/L   Chloride 106 96 - 112 mEq/L   CO2 27 19 - 32 mEq/L   Glucose, Bld 115 (H) 70 - 99 mg/dL   BUN 15 6 - 23 mg/dL   Creatinine, Ser 1.91 0.40 - 1.20 mg/dL   Total Bilirubin 0.6 0.2 - 1.2 mg/dL   Alkaline Phosphatase 85 39 - 117 U/L   AST 15 0 - 37 U/L   ALT 10 0 - 35 U/L   Total Protein 6.7 6.0 - 8.3 g/dL   Albumin 4.1 3.5 - 5.2 g/dL   GFR 47.82 >95.62 mL/min   Calcium 9.4 8.4 - 10.5 mg/dL  Hemoglobin Z3Y  Result Value Ref Range   Hgb A1c MFr Bld 6.8 (H) 4.6 - 6.5 %  Lipid panel  Result Value Ref Range   Cholesterol 166 0 - 200 mg/dL   Triglycerides 86.5 0.0 - 149.0 mg/dL   HDL 78.46 >96.29 mg/dL   VLDL 52.8 0.0 - 41.3 mg/dL   LDL Cholesterol 85 0 - 99 mg/dL   Total CHOL/HDL Ratio 3    NonHDL 103.99      COVID 19 screen:  No recent travel or known exposure to COVID19 The patient denies respiratory symptoms of COVID 19 at this time. The importance of social distancing was  discussed today.   Assessment and Plan The patient's preventative maintenance and recommended screening tests for an annual wellness exam were reviewed in full today. Brought up to date unless services declined.  Counselled on the importance of diet, exercise, and its role in overall health and mortality. The patient's FH and SH was reviewed, including their home life, tobacco status, and drug and alcohol status.   Vaccines; Uptodate PNA, consider shingrix and Td at pharmacy,  S/P COVID19 vaccine x4, due for flu shot.  Since DVE/pap:pap not indicated, DVE  not indicated   Colon: Date of Last Colonoscopy: 05/28/2007.. In [place] Results: Hyperplastic Polyp, recommended repeat in 5 years. She request stool ifob instead. Had ifob neg 05/2015.  NOW  Cologuard.. nml 03/2017, repeat in 2021 negative, repeat Cologuard in 2024 Mammo:06/2022 normal  scheduled DEXA: osteopenia stable T-1.8 spine 09/06/2018, repeat in 5 years,scheduled Hep C: completed  Microalbumin 08/2023  Problem List Items Addressed This Visit   None   No orders of the defined types were placed in this  encounter.    Kerby Nora, MD

## 2023-09-29 NOTE — Assessment & Plan Note (Signed)
Stable, chronic.  Continue current medication.   Crestor 5 mg po ever other day

## 2023-09-29 NOTE — Assessment & Plan Note (Signed)
Stable, chronic.  Continue current medication.  Amlodipine 5 mg p.o. daily

## 2023-09-29 NOTE — Addendum Note (Signed)
Addended by: Aundra Millet on: 09/29/2023 11:41 AM   Modules accepted: Orders

## 2023-09-29 NOTE — Assessment & Plan Note (Signed)
Diet controlled. Encouraged exercise, weight loss, healthy eating habits.  

## 2023-09-29 NOTE — Assessment & Plan Note (Addendum)
History of major depressive disorder, in remission off of venlafaxine.

## 2023-10-19 ENCOUNTER — Ambulatory Visit
Admission: RE | Admit: 2023-10-19 | Discharge: 2023-10-19 | Disposition: A | Discharge status: Home / Self Care | Payer: Medicare Other | Source: Ambulatory Visit | Attending: Family Medicine | Admitting: Family Medicine

## 2023-10-19 DIAGNOSIS — Z1231 Encounter for screening mammogram for malignant neoplasm of breast: Secondary | ICD-10-CM | POA: Diagnosis not present

## 2023-10-19 DIAGNOSIS — Z780 Asymptomatic menopausal state: Secondary | ICD-10-CM | POA: Diagnosis not present

## 2023-10-19 DIAGNOSIS — M810 Age-related osteoporosis without current pathological fracture: Secondary | ICD-10-CM | POA: Diagnosis not present

## 2023-11-02 DIAGNOSIS — H35051 Retinal neovascularization, unspecified, right eye: Secondary | ICD-10-CM | POA: Diagnosis not present

## 2023-11-02 DIAGNOSIS — H3581 Retinal edema: Secondary | ICD-10-CM | POA: Diagnosis not present

## 2023-11-11 ENCOUNTER — Other Ambulatory Visit (INDEPENDENT_AMBULATORY_CARE_PROVIDER_SITE_OTHER): Payer: Self-pay | Admitting: Nurse Practitioner

## 2023-11-11 DIAGNOSIS — I83893 Varicose veins of bilateral lower extremities with other complications: Secondary | ICD-10-CM

## 2023-11-15 ENCOUNTER — Encounter: Payer: Self-pay | Admitting: Family Medicine

## 2023-11-15 ENCOUNTER — Other Ambulatory Visit: Payer: Self-pay | Admitting: Family Medicine

## 2023-11-15 ENCOUNTER — Ambulatory Visit (INDEPENDENT_AMBULATORY_CARE_PROVIDER_SITE_OTHER): Payer: Medicare Other | Admitting: Family Medicine

## 2023-11-15 VITALS — BP 160/80 | HR 51 | Temp 98.3°F | Ht 64.5 in | Wt 155.0 lb

## 2023-11-15 DIAGNOSIS — R21 Rash and other nonspecific skin eruption: Secondary | ICD-10-CM

## 2023-11-15 DIAGNOSIS — T63421A Toxic effect of venom of ants, accidental (unintentional), initial encounter: Secondary | ICD-10-CM | POA: Diagnosis not present

## 2023-11-15 MED ORDER — TRIAMCINOLONE ACETONIDE 0.5 % EX CREA: 1.0000 | TOPICAL_CREAM | Freq: Two times a day (BID) | CUTANEOUS | 0 refills | Status: DC

## 2023-11-15 NOTE — Assessment & Plan Note (Addendum)
Acute, at ankle: no sign of associated cellulitis.  Treat inflammatory response with topical steroid triamcinolone 0.5% twice daily x 1 to 2 weeks.  Continue Zyrtec generic at bedtime.

## 2023-11-15 NOTE — Assessment & Plan Note (Addendum)
Rash at torso/abdomen : acute flare of likely chronic issue: No new triggers.  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  Encouraged her to avoid dry skin and winter with using moisturizing cream such as Cetaphil or Aquaphor.  Avoid dyes and fragrances and moisturizers.  Avoid over drying skin. Treat with topical triamcinolone 0.5% twice daily x 1 to 2 weeks.  She will let me know if the rash is not improving as expected for consideration of a course of oral prednisone.

## 2023-11-15 NOTE — Progress Notes (Signed)
Patient ID: Meghan Kerr, female    DOB: 09-05-1951, 72 y.o.   MRN: [id number]  This visit was conducted in person.  BP (!) 160/80 (BP Location: Right Arm, Patient Position: Sitting, Cuff Size: Normal)   Pulse (!) 51   Temp 98.3 F (36.8 C) (Temporal)   Ht 5' 4.5" (1.638 m)   Wt 155 lb (70.3 kg)   SpO2 98%   BMI 26.19 kg/m    CC:  Chief Complaint  Patient presents with   Rash    Abdomen/Breast   Insect Bite    Feet-Stepped in fire ants    Subjective:   HPI: Meghan Kerr is a 72 y.o. female presenting on 11/15/2023 for Rash (Abdomen/Breast) and Insect Bite (Feet-Stepped in fire ants)    New onset rash: noted at waist and on sides of breasts. Very itchy at times.  Had similar rash at back... seen derm.  Notes rash in last 2 weeks.  Very itchy.  No new meds, no lotions, no detergents.  Using  zyrtec generic [facility].   Increase stress lately.  Reviewed last Dermatolgy OV: 08/2023 Dx  prurigo nodularis/ lichen simplex chronicus    Also stepped in fir ants and was btiten multiple times on ankle 1 week ago.  Using calamine.                                                                   Relevant past medical, surgical, family and social history reviewed and updated as indicated. Interim medical history since our last visit reviewed. Allergies and medications reviewed and updated. Outpatient Medications Prior to Visit  Medication Sig Dispense Refill   amLODipine (NORVASC) 5 MG tablet TAKE 1 TABLET BY MOUTH EVERY DAY 90 tablet 3   Apoaequorin (PREVAGEN PO) Take 1 tablet by mouth daily.     Blood Glucose Monitoring Suppl (ONE TOUCH ULTRA MINI) w/Device KIT USE TO CHECK BLOOD SUGAR DAILY 1 kit 0   Fluocinolone Acetonide Body 0.01 % OIL Apply topically to itchy areas on scalp twice daily as needed. 120 mL 5   glucose blood (ONETOUCH ULTRA) test strip USE TO CHECK BLOOD SUGAR DAILY 100 each 3   Lancets (ONETOUCH ULTRASOFT) lancets USE TO CHECK BLOOD SUGAR DAILY 100  each 3   rosuvastatin (CRESTOR) 5 MG tablet TAKE 1 TABLET BY MOUTH EVERY OTHER DAY 45 tablet 3   Vitamin D, Cholecalciferol, 10 MCG (400 UNIT) CHEW Chew 1 tablet by mouth daily.     triamcinolone cream (KENALOG) 0.5 % APPLY TO AFFECTED AREA TWICE A DAY 30 g 0   No facility-administered medications prior to visit.     Per HPI unless specifically indicated in ROS section below Review of Systems  Constitutional:  Negative for fatigue and fever.  HENT:  Negative for congestion.   Eyes:  Negative for pain.  Respiratory:  Negative for cough and shortness of breath.   Cardiovascular:  Negative for chest pain, palpitations and leg swelling.  Gastrointestinal:  Negative for abdominal pain.  Genitourinary:  Negative for dysuria and vaginal bleeding.  Musculoskeletal:  Negative for back pain.  Skin:  Positive for rash.  Neurological:  Negative for syncope, light-headedness and headaches.  Psychiatric/Behavioral:  Negative for dysphoric mood.    Objective:  BP (!) 160/80 (BP Location: Right Arm, Patient Position: Sitting, Cuff Size: Normal)   Pulse (!) 51   Temp 98.3 F (36.8 C) (Temporal)   Ht 5' 4.5" (1.638 m)   Wt 155 lb (70.3 kg)   SpO2 98%   BMI 26.19 kg/m   Wt Readings from Last 3 Encounters:  11/15/23 155 lb (70.3 kg)  09/29/23 153 lb 9.6 oz (69.7 kg)  07/27/23 156 lb 6.4 oz (70.9 kg)      Physical Exam Constitutional:      General: She is not in acute distress.    Appearance: Normal appearance. She is well-developed. She is not ill-appearing or toxic-appearing.  HENT:     Head: Normocephalic.     Right Ear: Hearing, tympanic membrane, ear canal and external ear normal. Tympanic membrane is not erythematous, retracted or bulging.     Left Ear: Hearing, tympanic membrane, ear canal and external ear normal. Tympanic membrane is not erythematous, retracted or bulging.     Nose: No mucosal edema or rhinorrhea.     Right Sinus: No maxillary sinus tenderness or frontal sinus  tenderness.     Left Sinus: No maxillary sinus tenderness or frontal sinus tenderness.     Mouth/Throat:     Mouth: Oropharynx is clear and moist and mucous membranes are normal.     Pharynx: Uvula midline.  Eyes:     General: Lids are normal. Lids are everted, no foreign bodies appreciated.     Extraocular Movements: EOM normal.     Conjunctiva/sclera: Conjunctivae normal.     Pupils: Pupils are equal, round, and reactive to light.  Neck:     Thyroid: No thyroid mass or thyromegaly.     Vascular: No carotid bruit.     Trachea: Trachea normal.  Cardiovascular:     Rate and Rhythm: Normal rate and regular rhythm.     Pulses: Normal pulses.     Heart sounds: Normal heart sounds, S1 normal and S2 normal. No murmur heard.    No friction rub. No gallop.  Pulmonary:     Effort: Pulmonary effort is normal. No tachypnea or respiratory distress.     Breath sounds: Normal breath sounds. No decreased breath sounds, wheezing, rhonchi or rales.  Abdominal:     General: Bowel sounds are normal.     Palpations: Abdomen is soft.     Tenderness: There is no abdominal tenderness.  Musculoskeletal:     Cervical back: Normal range of motion and neck supple.  Skin:    General: Skin is warm, dry and intact.     Findings: No rash.     Comments: See photos.... red papules on abdomena nd breast laterally    Fire ant bites, with scabs and slight surrounding erythema.. see photo  Neurological:     Mental Status: She is alert.  Psychiatric:        Mood and Affect: Mood is not anxious or depressed.        Speech: Speech normal.        Behavior: Behavior normal. Behavior is cooperative.        Thought Content: Thought content normal.        Cognition and Memory: Cognition and memory normal.        Judgment: Judgment normal.          Results for orders placed or performed in visit on 09/29/23  HM DIABETES FOOT EXAM  Result Value Ref Range   HM Diabetic Foot Exam done  Assessment and  Plan  Rash Assessment & Plan: Rash at torso/abdomen : acute flare of likely chronic issue: No new triggers.  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  Encouraged her to avoid dry skin and winter with using moisturizing cream such as Cetaphil or Aquaphor.  Avoid dyes and fragrances and moisturizers.  Avoid over drying skin. Treat with topical triamcinolone 0.5% twice daily x 1 to 2 weeks.  She will let me know if the rash is not improving as expected for consideration of a course of oral prednisone.   Fire ant bite, accidental or unintentional, initial encounter Assessment & Plan: Acute, at ankle: no sign of associated cellulitis.  Treat inflammatory response with topical steroid triamcinolone 0.5% twice daily x 1 to 2 weeks.  Continue Zyrtec generic at bedtime.   Other orders -     Triamcinolone Acetonide; Apply 1 Application topically 2 (two) times daily.  Dispense: 30 g; Refill: 0    No follow-ups on file.   Kerby Nora, MD

## 2023-11-18 ENCOUNTER — Other Ambulatory Visit: Payer: Self-pay | Admitting: Family Medicine

## 2023-11-22 ENCOUNTER — Encounter (INDEPENDENT_AMBULATORY_CARE_PROVIDER_SITE_OTHER): Payer: BC Managed Care – PPO

## 2023-11-22 ENCOUNTER — Ambulatory Visit (INDEPENDENT_AMBULATORY_CARE_PROVIDER_SITE_OTHER): Payer: BC Managed Care – PPO | Admitting: Vascular Surgery

## 2023-12-05 ENCOUNTER — Encounter: Payer: Self-pay | Admitting: Internal Medicine

## 2023-12-05 ENCOUNTER — Ambulatory Visit (INDEPENDENT_AMBULATORY_CARE_PROVIDER_SITE_OTHER): Payer: Medicare Other | Admitting: Internal Medicine

## 2023-12-05 VITALS — BP 116/70 | HR 57 | Temp 98.1°F | Ht 64.5 in | Wt 154.0 lb

## 2023-12-05 DIAGNOSIS — S01311A Laceration without foreign body of right ear, initial encounter: Secondary | ICD-10-CM | POA: Diagnosis not present

## 2023-12-05 NOTE — Progress Notes (Signed)
Subjective:    Patient ID: Meghan Kerr, female    DOB: [birth year], 72 y.o.   MRN: [id number]  HPI Here due to a split in her ear lobe  This morning---was sitting and rubbed her earlobe and it split Has had big hole from earrings since college Sometimes falls asleep in them  Didn't bleed No pain  Current Outpatient Medications on File Prior to Visit  Medication Sig Dispense Refill   amLODipine (NORVASC) 5 MG tablet TAKE 1 TABLET BY MOUTH EVERY DAY 90 tablet 3   Apoaequorin (PREVAGEN PO) Take 1 tablet by mouth daily.     Blood Glucose Monitoring Suppl (ONE TOUCH ULTRA MINI) w/Device KIT USE TO CHECK BLOOD SUGAR DAILY 1 kit 0   Fluocinolone Acetonide Body 0.01 % OIL Apply topically to itchy areas on scalp twice daily as needed. 120 mL 5   glucose blood (ONETOUCH ULTRA) test strip USE TO CHECK BLOOD SUGAR DAILY 100 each 3   Lancets (ONETOUCH ULTRASOFT) lancets USE TO CHECK BLOOD SUGAR DAILY 100 each 3   rosuvastatin (CRESTOR) 5 MG tablet TAKE 1 TABLET BY MOUTH EVERY OTHER DAY 45 tablet 3   triamcinolone cream (KENALOG) 0.5 % Apply 1 Application topically 2 (two) times daily. 30 g 0   Vitamin D, Cholecalciferol, 10 MCG (400 UNIT) CHEW Chew 1 tablet by mouth daily.     No current facility-administered medications on file prior to visit.    Allergies  Allergen Reactions   Amoxicillin     REACTION: rash   Penicillins    Sulfa Antibiotics Other (See Comments)   Sulfonamide Derivatives     REACTION: Blisters on leg    Past Medical History:  Diagnosis Date   Hyperlipidemia    Hypertension     Past Surgical History:  Procedure Laterality Date   APPENDECTOMY     ECTOPIC PREGNANCY SURGERY  1976   HAMMER TOE SURGERY Left 08/19/2017   hardware removed April 2019   TRIGGER FINGER RELEASE  2006   WRIST FRACTURE SURGERY     broken left wrist repair with plate    Family History  Problem Relation Age of Onset   Diabetes Mother    Heart disease Mother        cad and chf    Heart disease Father        heart attack (massive)   Cancer Maternal Grandmother        COLON   Stroke Maternal Grandfather    Diabetes Brother    Heart disease Brother 79       hert attack   Heart disease Brother 64       CABG    Stroke Sister    Heart disease Sister        VALVE REPLACEMENT   Breast cancer Sister    Breast cancer Cousin 59    Social History   Socioeconomic History   Marital status: Married    Spouse name: Not on file   Number of children: Not on file   Years of education: Not on file   Highest education level: Not on file  Occupational History   Occupation: TEACHER    Comment: Kindergarten  Tobacco Use   Smoking status: Never   Smokeless tobacco: Never  Vaping Use   Vaping status: Never Used  Substance and Sexual Activity   Alcohol use: No   Drug use: No   Sexual activity: Not Currently  Other Topics Concern   Not on file  Social History Narrative   Not on file   Social Determinants of Health   Financial Resource Strain: Low Risk  (07/27/2023)   Overall Financial Resource Strain (CARDIA)    Difficulty of Paying Living Expenses: Not hard at all  Food Insecurity: No Food Insecurity (07/27/2023)   Hunger Vital Sign    Worried About Running Out of Food in the Last Year: Never true    Ran Out of Food in the Last Year: Never true  Transportation Needs: No Transportation Needs (07/27/2023)   PRAPARE - Administrator, Civil Service (Medical): No    Lack of Transportation (Non-Medical): No  Physical Activity: Insufficiently Active (07/27/2023)   Exercise Vital Sign    Days of Exercise per Week: 4 days    Minutes of Exercise per Session: 30 min  Stress: No Stress Concern Present (07/27/2023)   [employer] of Occupational Health - Occupational Stress Questionnaire    Feeling of Stress : Not at all  Social Connections: Socially Integrated (07/27/2023)   Social Connection and Isolation Panel [NHANES]    Frequency of Communication  with Friends and Family: More than three times a week    Frequency of Social Gatherings with Friends and Family: More than three times a week    Attends Religious Services: More than 4 times per year    Active Member of [employer] or Organizations: Yes    Attends Engineer, structural: More than 4 times per year    Marital Status: Married  Catering manager Violence: Not At Risk (07/27/2023)   Humiliation, Afraid, Rape, and Kick questionnaire    Fear of Current or Ex-Partner: No    Emotionally Abused: No    Physically Abused: No    Sexually Abused: No   Review of Systems     Objective:   Physical Exam Constitutional:      Appearance: Normal appearance.  HENT:     Ears:     Comments: Right earlobe is split up over 1 cm Only a very small spot at the end has open tissue--the rest is already fully epithelialized Neurological:     Mental Status: She is alert.            Assessment & Plan:

## 2023-12-05 NOTE — Assessment & Plan Note (Signed)
Will set up with plastic surgery since it will need more than just simple repair

## 2024-01-03 ENCOUNTER — Ambulatory Visit: Payer: Medicare Other | Admitting: Dermatology

## 2024-01-03 DIAGNOSIS — S31000A Unspecified open wound of lower back and pelvis without penetration into retroperitoneum, initial encounter: Secondary | ICD-10-CM

## 2024-01-03 DIAGNOSIS — H61111 Acquired deformity of pinna, right ear: Secondary | ICD-10-CM

## 2024-01-03 DIAGNOSIS — T1490XD Injury, unspecified, subsequent encounter: Secondary | ICD-10-CM

## 2024-01-03 MED ORDER — MUPIROCIN 2 % EX OINT: 1.0000 | TOPICAL_OINTMENT | CUTANEOUS | 2 refills | Status: DC

## 2024-01-03 NOTE — Progress Notes (Signed)
   Follow-Up Visit   Subjective  Meghan Kerr is a 73 y.o. female who presents for the following: check spots R lower abdomen ~2wks, no symptoms, tried TMC 0.5% cr and otc campho phenique with no improvement The patient has spots, moles and lesions to be evaluated, some may be new or changing and the patient may have concern these could be cancer.   The following portions of the chart were reviewed this encounter and updated as appropriate: medications, allergies, medical history  Review of Systems:  No other skin or systemic complaints except as noted in HPI or Assessment and Plan.  Objective  Well appearing patient in no apparent distress; mood and affect are within normal limits.   A focused examination was performed of the following areas: Abdomen, right ear  Relevant exam findings are noted in the Assessment and Plan.    Assessment & Plan   SPLIT EARLOBE, from previous injury R earlobe Exam: earlobe split at piercing  Treatment Plan: Recommend patient see a plastic surgeon if she would like repaired    Healing wound-  HEALING SPIDER BITE REACTION vs SHINGLES vs HSV Exam: Crusted punched out ulcerations R lat inguinal crease.  Pt says not itchy, feels numb.  Treatment Plan: Start Mupirocin  oint bid to aa R inguinal crease and cover with bandage until healed      Return if symptoms worsen or fail to improve.  I, Grayce Saunas, RMA, am acting as scribe for Rexene Rattler, MD .   Documentation: I have reviewed the above documentation for accuracy and completeness, and I agree with the above.  Rexene Rattler, MD

## 2024-01-03 NOTE — Patient Instructions (Signed)

## 2024-01-05 ENCOUNTER — Ambulatory Visit (INDEPENDENT_AMBULATORY_CARE_PROVIDER_SITE_OTHER)
Admission: RE | Admit: 2024-01-05 | Discharge: 2024-01-05 | Disposition: A | Discharge status: Home / Self Care | Payer: Medicare Other | Source: Ambulatory Visit | Attending: Family Medicine | Admitting: Family Medicine

## 2024-01-05 ENCOUNTER — Encounter: Payer: Self-pay | Admitting: Family Medicine

## 2024-01-05 ENCOUNTER — Ambulatory Visit: Payer: Self-pay | Admitting: Family Medicine

## 2024-01-05 ENCOUNTER — Ambulatory Visit (INDEPENDENT_AMBULATORY_CARE_PROVIDER_SITE_OTHER): Payer: Medicare Other | Admitting: Family Medicine

## 2024-01-05 VITALS — BP 136/84 | HR 69 | Temp 97.5°F | Ht 64.5 in | Wt 152.2 lb

## 2024-01-05 DIAGNOSIS — W19XXXA Unspecified fall, initial encounter: Secondary | ICD-10-CM | POA: Diagnosis not present

## 2024-01-05 DIAGNOSIS — R0789 Other chest pain: Secondary | ICD-10-CM | POA: Diagnosis not present

## 2024-01-05 DIAGNOSIS — M8589 Other specified disorders of bone density and structure, multiple sites: Secondary | ICD-10-CM | POA: Diagnosis not present

## 2024-01-05 DIAGNOSIS — R0781 Pleurodynia: Secondary | ICD-10-CM | POA: Diagnosis not present

## 2024-01-05 MED ORDER — TRAMADOL HCL 50 MG PO TABS: 50.0000 mg | ORAL_TABLET | Freq: Four times a day (QID) | ORAL | 0 refills | Status: DC | PRN

## 2024-01-05 NOTE — Progress Notes (Signed)
 Patient ID: Meghan Kerr, female    DOB: [birth year], 73 y.o.   MRN: [id number]  This visit was conducted in person.  BP 136/84   Pulse 69   Temp (!) 97.5 F (36.4 C) (Oral)   Ht 5' 4.5 (1.638 m)   Wt 152 lb 4 oz (69.1 kg)   SpO2 96%   BMI 25.73 kg/m    CC:  Chief Complaint  Patient presents with   Fall    Pt tripped and fell at home hitting chest 01/02/24. C/o injury to R-side ribs and center chest. Has pain when taking deep breath and making certain movements.     Subjective:   HPI: Meghan Kerr is a 73 y.o. female presenting on 01/05/2024 for Fall (Pt tripped and fell at home hitting chest 01/02/24. C/o injury to R-side ribs and center chest. Has pain when taking deep breath and making certain movements. )  Accidental fall with no preceding symptoms on January 02, 2024 tripped.  No dizziness, no CP,  no palpitations. Landed on the  anterior chest wall... hit candelabra on hearth. Now with significant pain in right rib cage and center anterior chest increased with deep breaths and movements of arm.  She  has constant 5/6 out of 10, sharp with pain with deep breaths.  No shortness of breath, wheeze.  No fever.  No fever,  mild cough.  Husband with viral URI.   Acetaminophen  650 mg  x 2  through the day..helps some.     Relevant past medical, surgical, family and social history reviewed and updated as indicated. Interim medical history since our last visit reviewed. Allergies and medications reviewed and updated. Outpatient Medications Prior to Visit  Medication Sig Dispense Refill   amLODipine  (NORVASC ) 5 MG tablet TAKE 1 TABLET BY MOUTH EVERY DAY 90 tablet 3   Apoaequorin (PREVAGEN PO) Take 1 tablet by mouth daily.     Blood Glucose Monitoring Suppl (ONE TOUCH ULTRA MINI) w/Device KIT USE TO CHECK BLOOD SUGAR DAILY 1 kit 0   Fluocinolone  Acetonide Body 0.01 % OIL Apply topically to itchy areas on scalp twice daily as needed. 120 mL 5   glucose blood (ONETOUCH  ULTRA) test strip USE TO CHECK BLOOD SUGAR DAILY 100 each 3   Lancets (ONETOUCH ULTRASOFT) lancets USE TO CHECK BLOOD SUGAR DAILY 100 each 3   mupirocin  ointment (BACTROBAN ) 2 % Apply 1 Application topically as directed. Bid to ulcers on lower abdomen and cover with bandage, do this for 1 week then decrease to qd until healed 22 g 2   rosuvastatin  (CRESTOR ) 5 MG tablet TAKE 1 TABLET BY MOUTH EVERY OTHER DAY 45 tablet 3   triamcinolone  cream (KENALOG ) 0.5 % Apply 1 Application topically 2 (two) times daily. 30 g 0   Vitamin D , Cholecalciferol, 10 MCG (400 UNIT) CHEW Chew 1 tablet by mouth daily.     No facility-administered medications prior to visit.     Per HPI unless specifically indicated in ROS section below Review of Systems  Constitutional:  Negative for fatigue and fever.  HENT:  Negative for congestion.   Eyes:  Negative for pain.  Respiratory:  Positive for cough. Negative for shortness of breath.   Cardiovascular:  Negative for chest pain, palpitations and leg swelling.  Gastrointestinal:  Negative for abdominal pain.  Genitourinary:  Negative for dysuria and vaginal bleeding.  Musculoskeletal:  Negative for back pain.  Neurological:  Negative for syncope, light-headedness and headaches.  Psychiatric/Behavioral:  Negative for dysphoric mood.    Objective:  BP 136/84   Pulse 69   Temp (!) 97.5 F (36.4 C) (Oral)   Ht 5' 4.5 (1.638 m)   Wt 152 lb 4 oz (69.1 kg)   SpO2 96%   BMI 25.73 kg/m   Wt Readings from Last 3 Encounters:  01/05/24 152 lb 4 oz (69.1 kg)  12/05/23 154 lb (69.9 kg)  11/15/23 155 lb (70.3 kg)      Physical Exam Constitutional:      General: She is not in acute distress.    Appearance: Normal appearance. She is well-developed. She is not ill-appearing or toxic-appearing.  HENT:     Head: Normocephalic.     Right Ear: Hearing, tympanic membrane, ear canal and external ear normal. Tympanic membrane is not erythematous, retracted or bulging.      Left Ear: Hearing, tympanic membrane, ear canal and external ear normal. Tympanic membrane is not erythematous, retracted or bulging.     Nose: No mucosal edema or rhinorrhea.     Right Sinus: No maxillary sinus tenderness or frontal sinus tenderness.     Left Sinus: No maxillary sinus tenderness or frontal sinus tenderness.     Mouth/Throat:     Pharynx: Uvula midline.  Eyes:     General: Lids are normal. Lids are everted, no foreign bodies appreciated.     Conjunctiva/sclera: Conjunctivae normal.     Pupils: Pupils are equal, round, and reactive to light.  Neck:     Thyroid : No thyroid  mass or thyromegaly.     Vascular: No carotid bruit.     Trachea: Trachea normal.  Cardiovascular:     Rate and Rhythm: Normal rate and regular rhythm.     Pulses: Normal pulses.     Heart sounds: Normal heart sounds, S1 normal and S2 normal. No murmur heard.    No friction rub. No gallop.  Pulmonary:     Effort: Pulmonary effort is normal. No tachypnea or respiratory distress.     Breath sounds: Normal breath sounds. No decreased breath sounds, wheezing, rhonchi or rales.  Chest:     Chest wall: Tenderness present. There is no dullness to percussion.  Breasts:    Breasts are symmetrical.     Right: Normal. No tenderness.     Left: Normal. No tenderness.    Abdominal:     General: Bowel sounds are normal.     Palpations: Abdomen is soft.     Tenderness: There is no abdominal tenderness.  Musculoskeletal:     Cervical back: Normal range of motion and neck supple.  Skin:    General: Skin is warm and dry.     Findings: No rash.  Neurological:     Mental Status: She is alert.  Psychiatric:        Mood and Affect: Mood is not anxious or depressed.        Speech: Speech normal.        Behavior: Behavior normal. Behavior is cooperative.        Thought Content: Thought content normal.        Judgment: Judgment normal.       Results for orders placed or performed in visit on 09/29/23  HM  DIABETES FOOT EXAM   Collection Time: 09/29/23 12:00 AM  Result Value Ref Range   HM Diabetic Foot Exam done     Assessment and Plan  Accidental fall, initial encounter Assessment & Plan: No preceding symptoms.  Orders: -  DG Ribs Unilateral Right; Future  Right-sided chest wall pain Assessment & Plan: Acute, likely musculoskeletal chest wall injury.  Patient with osteopenia age 82 so will evaluate for rib fracture.  Encouraged patient to take deep breaths to avoid pneumonia.  No current abnormality on lung exam. Will treat pain with tramadol  50 mg every 6-8 hours as needed for pain breakthrough if acetaminophen  not controlling pain.  Orders: -     DG Ribs Unilateral Right; Future  Osteopenia of multiple sites  Other orders -     traMADol  HCl; Take 1 tablet (50 mg total) by mouth every 6 (six) hours as needed for moderate pain (pain score 4-6).  Dispense: 20 tablet; Refill: 0    No follow-ups on file.   Greig Ring, MD

## 2024-01-05 NOTE — Assessment & Plan Note (Signed)
 Acute, likely musculoskeletal chest wall injury.  Patient with osteopenia age 73 so will evaluate for rib fracture.  Encouraged patient to take deep breaths to avoid pneumonia.  No current abnormality on lung exam. Will treat pain with tramadol  50 mg every 6-8 hours as needed for pain breakthrough if acetaminophen  not controlling pain.

## 2024-01-05 NOTE — Assessment & Plan Note (Signed)
No preceding symptoms.

## 2024-01-05 NOTE — Telephone Encounter (Signed)
 Noted.

## 2024-01-05 NOTE — Telephone Encounter (Signed)
 Chief Complaint: trip and fall Symptoms: right lower rib pain, cough, headache Frequency: since Kerr, fall occurred once on [place]. Pertinent Negatives: Patient denies head injury, fever, bruising, redness, swelling, SOB, runny nose, wheezing Disposition: [] ED /[] Urgent Care (no appt availability in office) / [x] Appointment(In office/virtual)/ []  [facility]/ [] Home Care/ [] Refused Recommended Disposition /[]  Mobile Bus/ []  Follow-up with PCP Additional Notes: Patient states she had a trip and fall on [place] and landed on her right side with impact to her right ribs. Patient c/o moderate pain to right ribs worse with twisting or movement. Patient states she can take a full deep breath. Patient states she also has had a cough since [place] and states her husband has had it too. She has been treating her symptoms at home with acetaminophen .  Copied from CRM [id number]. Topic: Clinical - Red Word Triage >> Jan 05, 2024  8:01 AM Meghan Kerr wrote: Red Word that prompted transfer to Nurse Triage: Meghan Kerr / rib cage sore Reason for Disposition  [1] MODERATE pain (e.g., interferes with normal activities) AND [2] high-risk adult (e.g., age > 60 years, osteoporosis, chronic steroid use)  Cough  Answer Assessment - Initial Assessment Questions 1. MECHANISM: How did the injury happen?     Trip and fall on rug, fell and landed on a candle holder near fireplace at home. Right side of chest/sternum landed/made impact.  2. ONSET: When did the injury happen? (Minutes or hours ago)     K[place] afternoon.  3. LOCATION: Where on the chest is the injury located?     Right lower front ribs, patient states near her sternum to the right.  4. APPEARANCE: What does the injury look like?     Patient denies any swelling, bruising or redness.  5. BLEEDING: Is there any bleeding now? If Yes, ask: How long has it been bleeding?     Denies bleeding.  6. SEVERITY: Any difficulty with  breathing?     Denies SOB, states with sudden turns or coughing it is painful.  7. SIZE: For cuts, bruises, or swelling, ask: How large is it? (e.g., inches or centimeters)     Denies any cuts, bruises or swelling.  8. PAIN: Is there pain? If Yes, ask: How bad is the pain?   (e.g., Scale 1-10; or mild, moderate, severe)    6-7 /10; sore. Comes and goes, worse with taking a deep breath.  Answer Assessment - Initial Assessment Questions 1. ONSET: When did the cough begin?      [place]  2. SEVERITY: How bad is the cough today?      3-4/10.  3. SPUTUM: Describe the color of your sputum (none, dry cough; clear, white, yellow, green)     Cloudy white phlegm.  4. HEMOPTYSIS: Are you coughing up any blood? If so ask: How much? (flecks, streaks, tablespoons, etc.)     Denies.  5. DIFFICULTY BREATHING: Are you having difficulty breathing? If Yes, ask: How bad is it? (e.g., mild, moderate, severe)    - MILD: No SOB at rest, mild SOB with walking, speaks normally in sentences, can lie down, no retractions, pulse < 100.    - MODERATE: SOB at rest, SOB with minimal exertion and prefers to sit, cannot lie down flat, speaks in phrases, mild retractions, audible wheezing, pulse 100-120.    - SEVERE: Very SOB at rest, speaks in single words, struggling to breathe, sitting hunched forward, retractions, pulse > 120      Denies.  6. FEVER: Do you have a fever? If Yes, ask: What is your temperature, how was it measured, and when did it start?     Denies.  7. CARDIAC HISTORY: Do you have any history of heart disease? (e.g., heart attack, congestive heart failure)      Denies.  8. LUNG HISTORY: Do you have any history of lung disease?  (e.g., pulmonary embolus, asthma, emphysema)     Denies.  9. PE RISK FACTORS: Do you have a history of blood clots? (or: recent major surgery, recent prolonged travel, bedridden)     Denies.  10. OTHER SYMPTOMS: Do you have any other  symptoms? (e.g., runny nose, wheezing, chest pain)       Headache.  11. TRAVEL: Have you traveled out of the country in the last month? (e.g., travel history, exposures)       Patient states her husband has had cough and congestion as well.  Protocols used: Chest Injury-A-AH, Cough - Acute Productive-A-AH

## 2024-01-11 ENCOUNTER — Ambulatory Visit (INDEPENDENT_AMBULATORY_CARE_PROVIDER_SITE_OTHER): Payer: Self-pay | Admitting: Plastic Surgery

## 2024-01-11 ENCOUNTER — Encounter: Payer: Self-pay | Admitting: Plastic Surgery

## 2024-01-11 VITALS — BP 170/79 | HR 55 | Ht 65.0 in | Wt 152.0 lb

## 2024-01-11 DIAGNOSIS — Q178 Other specified congenital malformations of ear: Secondary | ICD-10-CM

## 2024-01-11 NOTE — Progress Notes (Signed)
 Referring Provider Helaine Llanos, MD [address] [place],  [place] [address]   CC:  Chief Complaint  Patient presents with   Consult      Meghan Kerr is an 73 y.o. female.  HPI: Meghan Kerr is a 73 year old female who presents today with complaints of a torn right earlobe.  She states that her earring pulled through several years ago and she is unhappy with the appearance of the earlobe and would like to have it repaired.  Allergies  Allergen Reactions   Amoxicillin     REACTION: rash   Penicillins    Sulfa Antibiotics Other (See Comments)   Sulfonamide Derivatives     REACTION: Blisters on leg    Outpatient Encounter Medications as of 01/11/2024  Medication Sig   amLODipine  (NORVASC ) 5 MG tablet TAKE 1 TABLET BY MOUTH EVERY DAY   Apoaequorin (PREVAGEN PO) Take 1 tablet by mouth daily.   Blood Glucose Monitoring Suppl (ONE TOUCH ULTRA MINI) w/Device KIT USE TO CHECK BLOOD SUGAR DAILY   Fluocinolone  Acetonide Body 0.01 % OIL Apply topically to itchy areas on scalp twice daily as needed.   glucose blood (ONETOUCH ULTRA) test strip USE TO CHECK BLOOD SUGAR DAILY   Lancets (ONETOUCH ULTRASOFT) lancets USE TO CHECK BLOOD SUGAR DAILY   mupirocin  ointment (BACTROBAN ) 2 % Apply 1 Application topically as directed. Bid to ulcers on lower abdomen and cover with bandage, do this for 1 week then decrease to qd until healed   rosuvastatin  (CRESTOR ) 5 MG tablet TAKE 1 TABLET BY MOUTH EVERY OTHER DAY   traMADol  (ULTRAM ) 50 MG tablet Take 1 tablet (50 mg total) by mouth every 6 (six) hours as needed for moderate pain (pain score 4-6).   triamcinolone  cream (KENALOG ) 0.5 % Apply 1 Application topically 2 (two) times daily.   Vitamin D , Cholecalciferol, 10 MCG (400 UNIT) CHEW Chew 1 tablet by mouth daily.   No facility-administered encounter medications on file as of 01/11/2024.     Past Medical History:  Diagnosis Date   Hyperlipidemia    Hypertension     Past  Surgical History:  Procedure Laterality Date   APPENDECTOMY     ECTOPIC PREGNANCY SURGERY  1976   HAMMER TOE SURGERY Left 08/19/2017   hardware removed April 2019   TRIGGER FINGER RELEASE  2006   WRIST FRACTURE SURGERY     broken left wrist repair with plate    Family History  Problem Relation Age of Onset   Diabetes Mother    Heart disease Mother        cad and chf   Heart disease Father        heart attack (massive)   Cancer Maternal Grandmother        COLON   Stroke Maternal Grandfather    Diabetes Brother    Heart disease Brother 16       hert attack   Heart disease Brother 60       CABG    Stroke Sister    Heart disease Sister        VALVE REPLACEMENT   Breast cancer Sister    Breast cancer Cousin 21    Social History   Social History Narrative   Not on file     Review of Systems General: Denies fevers, chills, weight loss CV: Denies chest pain, shortness of breath, palpitations Earlobe: Split earlobe from an earring pulling through  Physical Exam    01/11/2024  8:12 AM 01/05/2024    2:15 PM 12/05/2023    9:30 AM  Vitals with BMI  Height 5\' 5"  5' 4.5" 5' 4.5"  Weight 152 lbs 152 lbs 4 oz 154 lbs  BMI 25.29 25.74 26.04  Systolic 178 136 914  Diastolic 81 84 70  Pulse 64 69 57    General:  No acute distress,  Alert and oriented, Non-Toxic, Normal speech and affect Right earlobe: Torn right earlobe with complete reepithelialization of both surfaces. Mammogram: October 2024 BI-RADS 1 Assessment/Plan Torn right earlobe: Discussed with the patient the pathophysiology for the torn earlobe.  We discussed the fact that the surfaces are reepithelialized and that they will never grow back together on their own.  I discussed the procedure with the patient including the fact that she will have sutures of will need to be removed 5 to 7 days after surgery.  Will schedule repair of the right earlobe at her request.  This will be done in the office.  Photographs were  obtained today with her consent.  Teretha Ferguson 01/11/2024, 8:27 AM

## 2024-01-16 ENCOUNTER — Encounter: Payer: Self-pay | Admitting: Dermatology

## 2024-01-16 ENCOUNTER — Ambulatory Visit: Payer: Medicare Other | Admitting: Dermatology

## 2024-01-16 DIAGNOSIS — L989 Disorder of the skin and subcutaneous tissue, unspecified: Secondary | ICD-10-CM | POA: Diagnosis not present

## 2024-01-16 DIAGNOSIS — D489 Neoplasm of uncertain behavior, unspecified: Secondary | ICD-10-CM | POA: Diagnosis not present

## 2024-01-16 DIAGNOSIS — D492 Neoplasm of unspecified behavior of bone, soft tissue, and skin: Secondary | ICD-10-CM | POA: Diagnosis not present

## 2024-01-16 DIAGNOSIS — L98499 Non-pressure chronic ulcer of skin of other sites with unspecified severity: Secondary | ICD-10-CM | POA: Diagnosis not present

## 2024-01-16 NOTE — Progress Notes (Signed)
   Follow-Up Visit   Subjective  Meghan Kerr is a 73 y.o. female who presents for the following: follow up for healing wound at right inguinal crease. Patient currently using mupirocin 1-2 times daily. Patient advises it feels rough and like there's heat coming out of it. Patient saw Dr. Roseanne Reno 2 weeks ago , ddx  HEALING SPIDER BITE REACTION vs SHINGLES vs HSV .  The patient has spots, moles and lesions to be evaluated, some may be new or changing and the patient may have concern these could be cancer.   The following portions of the chart were reviewed this encounter and updated as appropriate: medications, allergies, medical history  Review of Systems:  No other skin or systemic complaints except as noted in HPI or Assessment and Plan.  Objective  Well appearing patient in no apparent distress; mood and affect are within normal limits.   A focused examination was performed of the following areas: Right hip area  Relevant exam findings are noted in the Assessment and Plan.  right lower abdomen 4 oval shaped ulcerations with surrounding induration and hyperpigmentation, one crusted   Assessment & Plan     NEOPLASM OF UNCERTAIN BEHAVIOR right lower abdomen Skin / nail biopsy Type of biopsy: tangential   Informed consent: discussed and consent obtained   Timeout: patient name, date of birth, surgical site, and procedure verified   Procedure prep:  Patient was prepped and draped in usual sterile fashion Prep type:  Isopropyl alcohol Anesthesia: the lesion was anesthetized in a standard fashion   Anesthetic:  1% lidocaine w/ epinephrine 1-100,000 buffered w/ 8.4% NaHCO3 Instrument used: DermaBlade   Hemostasis achieved with: pressure and aluminum chloride   Outcome: patient tolerated procedure well   Post-procedure details: sterile dressing applied and wound care instructions given   Dressing type: bandage and petrolatum   Specimen 1 - Surgical pathology Differential  Diagnosis: HSV/VZV vs spider bite (necrosis) vs trauma  Check Margins: No 4 oval shaped ulcerations with surrounding induration and hyperpigmentation, one crusted Continue mupirocin BID HSV VZV swab biopsy Related Procedures HSV and VZV PCR Panel  Return for as scheduled.  Meghan Kerr, RMA, am acting as scribe for Elie Goody, MD .   Documentation: I have reviewed the above documentation for accuracy and completeness, and I agree with the above.  Elie Goody, MD

## 2024-01-16 NOTE — Patient Instructions (Signed)

## 2024-01-18 ENCOUNTER — Ambulatory Visit: Payer: Medicare Other | Admitting: Dermatology

## 2024-01-18 DIAGNOSIS — H35051 Retinal neovascularization, unspecified, right eye: Secondary | ICD-10-CM | POA: Diagnosis not present

## 2024-01-18 DIAGNOSIS — H3581 Retinal edema: Secondary | ICD-10-CM | POA: Diagnosis not present

## 2024-01-18 LAB — HSV AND VZV PCR PANEL
HSV 2 DNA: NEGATIVE
HSV-1 DNA: NEGATIVE
Varicella-Zoster, PCR: NEGATIVE

## 2024-01-19 ENCOUNTER — Encounter: Payer: Self-pay | Admitting: Dermatology

## 2024-01-19 LAB — SURGICAL PATHOLOGY

## 2024-01-20 ENCOUNTER — Encounter: Payer: Self-pay | Admitting: Dermatology

## 2024-01-23 ENCOUNTER — Telehealth: Payer: Self-pay

## 2024-01-23 NOTE — Telephone Encounter (Signed)
Patient advised of BX results and PCR swabs. aw

## 2024-01-23 NOTE — Telephone Encounter (Signed)
Patient called office and made aware of BX results and PCR testing. aw

## 2024-01-28 ENCOUNTER — Observation Stay (HOSPITAL_COMMUNITY)
Admission: EM | Admit: 2024-01-28 | Discharge: 2024-01-29 | Disposition: A | Discharge status: Home / Self Care | Payer: Medicare Other | Attending: Internal Medicine | Admitting: Internal Medicine

## 2024-01-28 ENCOUNTER — Emergency Department (HOSPITAL_COMMUNITY): Payer: Medicare Other

## 2024-01-28 ENCOUNTER — Observation Stay (HOSPITAL_COMMUNITY): Payer: Medicare Other

## 2024-01-28 ENCOUNTER — Encounter (HOSPITAL_COMMUNITY): Payer: Self-pay

## 2024-01-28 ENCOUNTER — Other Ambulatory Visit: Payer: Self-pay

## 2024-01-28 DIAGNOSIS — Z794 Long term (current) use of insulin: Secondary | ICD-10-CM | POA: Insufficient documentation

## 2024-01-28 DIAGNOSIS — I6782 Cerebral ischemia: Secondary | ICD-10-CM | POA: Diagnosis not present

## 2024-01-28 DIAGNOSIS — I10 Essential (primary) hypertension: Secondary | ICD-10-CM | POA: Diagnosis not present

## 2024-01-28 DIAGNOSIS — I6503 Occlusion and stenosis of bilateral vertebral arteries: Secondary | ICD-10-CM | POA: Diagnosis not present

## 2024-01-28 DIAGNOSIS — R42 Dizziness and giddiness: Secondary | ICD-10-CM | POA: Diagnosis not present

## 2024-01-28 DIAGNOSIS — I63233 Cerebral infarction due to unspecified occlusion or stenosis of bilateral carotid arteries: Secondary | ICD-10-CM | POA: Diagnosis not present

## 2024-01-28 DIAGNOSIS — E1159 Type 2 diabetes mellitus with other circulatory complications: Secondary | ICD-10-CM | POA: Diagnosis present

## 2024-01-28 DIAGNOSIS — R29818 Other symptoms and signs involving the nervous system: Secondary | ICD-10-CM | POA: Diagnosis not present

## 2024-01-28 DIAGNOSIS — E785 Hyperlipidemia, unspecified: Secondary | ICD-10-CM | POA: Diagnosis not present

## 2024-01-28 DIAGNOSIS — R2681 Unsteadiness on feet: Secondary | ICD-10-CM | POA: Insufficient documentation

## 2024-01-28 DIAGNOSIS — I6381 Other cerebral infarction due to occlusion or stenosis of small artery: Secondary | ICD-10-CM | POA: Insufficient documentation

## 2024-01-28 DIAGNOSIS — Z79899 Other long term (current) drug therapy: Secondary | ICD-10-CM | POA: Insufficient documentation

## 2024-01-28 DIAGNOSIS — E1169 Type 2 diabetes mellitus with other specified complication: Secondary | ICD-10-CM | POA: Diagnosis present

## 2024-01-28 DIAGNOSIS — G459 Transient cerebral ischemic attack, unspecified: Principal | ICD-10-CM | POA: Diagnosis present

## 2024-01-28 DIAGNOSIS — R531 Weakness: Secondary | ICD-10-CM | POA: Diagnosis not present

## 2024-01-28 DIAGNOSIS — J8410 Pulmonary fibrosis, unspecified: Secondary | ICD-10-CM | POA: Diagnosis not present

## 2024-01-28 DIAGNOSIS — R4781 Slurred speech: Secondary | ICD-10-CM | POA: Diagnosis not present

## 2024-01-28 DIAGNOSIS — I639 Cerebral infarction, unspecified: Secondary | ICD-10-CM | POA: Diagnosis not present

## 2024-01-28 DIAGNOSIS — I672 Cerebral atherosclerosis: Secondary | ICD-10-CM | POA: Diagnosis not present

## 2024-01-28 LAB — CBG MONITORING, ED: Glucose-Capillary: 126 mg/dL — ABNORMAL HIGH (ref 70–99)

## 2024-01-28 LAB — HEMOGLOBIN A1C
Hgb A1c MFr Bld: 6.9 % — ABNORMAL HIGH (ref 4.8–5.6)
Mean Plasma Glucose: 151.33 mg/dL

## 2024-01-28 LAB — CBC
HCT: 43.7 % (ref 36.0–46.0)
Hemoglobin: 14.1 g/dL (ref 12.0–15.0)
MCH: 29.7 pg (ref 26.0–34.0)
MCHC: 32.3 g/dL (ref 30.0–36.0)
MCV: 92.2 fL (ref 80.0–100.0)
Platelets: 190 10*3/uL (ref 150–400)
RBC: 4.74 MIL/uL (ref 3.87–5.11)
RDW: 12 % (ref 11.5–15.5)
WBC: 7 10*3/uL (ref 4.0–10.5)
nRBC: 0 % (ref 0.0–0.2)

## 2024-01-28 LAB — DIFFERENTIAL
Abs Immature Granulocytes: 0.03 10*3/uL (ref 0.00–0.07)
Basophils Absolute: 0 10*3/uL (ref 0.0–0.1)
Basophils Relative: 0 %
Eosinophils Absolute: 0.1 10*3/uL (ref 0.0–0.5)
Eosinophils Relative: 1 %
Immature Granulocytes: 0 %
Lymphocytes Relative: 19 %
Lymphs Abs: 1.4 10*3/uL (ref 0.7–4.0)
Monocytes Absolute: 0.4 10*3/uL (ref 0.1–1.0)
Monocytes Relative: 6 %
Neutro Abs: 5.1 10*3/uL (ref 1.7–7.7)
Neutrophils Relative %: 74 %

## 2024-01-28 LAB — URINALYSIS, ROUTINE W REFLEX MICROSCOPIC
Bilirubin Urine: NEGATIVE
Glucose, UA: 100 mg/dL — AB
Hgb urine dipstick: NEGATIVE
Ketones, ur: NEGATIVE mg/dL
Leukocytes,Ua: NEGATIVE
Nitrite: NEGATIVE
Protein, ur: NEGATIVE mg/dL
Specific Gravity, Urine: 1.015 (ref 1.005–1.030)
pH: 6 (ref 5.0–8.0)

## 2024-01-28 LAB — PHOSPHORUS: Phosphorus: 3.5 mg/dL (ref 2.5–4.6)

## 2024-01-28 LAB — RAPID URINE DRUG SCREEN, HOSP PERFORMED
Amphetamines: NOT DETECTED
Barbiturates: NOT DETECTED
Benzodiazepines: NOT DETECTED
Cocaine: NOT DETECTED
Opiates: NOT DETECTED
Tetrahydrocannabinol: NOT DETECTED

## 2024-01-28 LAB — ETHANOL: Alcohol, Ethyl (B): <10 mg/dL (ref <10)

## 2024-01-28 LAB — COMPREHENSIVE METABOLIC PANEL
ALT: 14 U/L (ref 0–44)
AST: 19 U/L (ref 15–41)
Albumin: 3.7 g/dL (ref 3.5–5.0)
Alkaline Phosphatase: 95 U/L (ref 38–126)
Anion gap: 9 (ref 5–15)
BUN: 16 mg/dL (ref 8–23)
CO2: 24 mmol/L (ref 22–32)
Calcium: 8.9 mg/dL (ref 8.9–10.3)
Chloride: 105 mmol/L (ref 98–111)
Creatinine, Ser: 0.91 mg/dL (ref 0.44–1.00)
GFR, Estimated: >60 mL/min (ref >60)
Glucose, Bld: 193 mg/dL — ABNORMAL HIGH (ref 70–99)
Potassium: 3.4 mmol/L — ABNORMAL LOW (ref 3.5–5.1)
Sodium: 138 mmol/L (ref 135–145)
Total Bilirubin: 0.4 mg/dL (ref 0.0–1.2)
Total Protein: 6.4 g/dL — ABNORMAL LOW (ref 6.5–8.1)

## 2024-01-28 LAB — APTT: aPTT: 22 s — ABNORMAL LOW (ref 24–36)

## 2024-01-28 LAB — MAGNESIUM: Magnesium: 2.2 mg/dL (ref 1.7–2.4)

## 2024-01-28 LAB — PROTIME-INR
INR: 1 (ref 0.8–1.2)
Prothrombin Time: 13.5 s (ref 11.4–15.2)

## 2024-01-28 LAB — TSH: TSH: 1.121 u[IU]/mL (ref 0.350–4.500)

## 2024-01-28 MED ORDER — IOHEXOL 350 MG/ML SOLN
75.0000 mL | Freq: Once | INTRAVENOUS | Status: AC | PRN
  Administered 2024-01-28: 75 mL via INTRAVENOUS

## 2024-01-28 MED ORDER — INSULIN ASPART 100 UNIT/ML IJ SOLN
0.0000 [IU] | INTRAMUSCULAR | Status: DC
  Administered 2024-01-29: 1 [IU] via SUBCUTANEOUS

## 2024-01-28 MED ORDER — POTASSIUM CHLORIDE CRYS ER 20 MEQ PO TBCR
20.0000 meq | EXTENDED_RELEASE_TABLET | Freq: Once | ORAL | Status: AC
  Administered 2024-01-28: 20 meq via ORAL
  Filled 2024-01-28: qty 1

## 2024-01-28 NOTE — ED Notes (Signed)
RN had pt change back into gown and placed on monitor.

## 2024-01-28 NOTE — Assessment & Plan Note (Signed)
-   will admit based on TIA/CVA protocol,        Monitor on Tele        MRI   Resulted - showing subacute ischemic CVA        CTA ordered,         Echo to evaluate for possible embolic source,        obtain cardiac enzymes,  ECG,   Lipid panel, TSH.        Order PT/OT evaluation.        Passed swallow eva        Will make sure patient is on antiplatelet ASA 81   defer to Neurology if needs Plavix agent continue statin        Allow permissive Hypertension keep BP <220/120        Neurology consulted

## 2024-01-28 NOTE — Subjective & Objective (Signed)
New onset of blurred vision, vertigo, slurred speech, left side weakness. Was attending a memorial service Symptoms rapidly resolved No prior hx of CVA

## 2024-01-28 NOTE — Assessment & Plan Note (Signed)
Allow permissive hypertension 

## 2024-01-28 NOTE — Assessment & Plan Note (Signed)
Continue Crestor 5 mg po q day

## 2024-01-28 NOTE — Assessment & Plan Note (Signed)
-   Order Sensitive  SSI     -  check TSH and HgA1C  - Hold by mouth medications*  

## 2024-01-28 NOTE — H&P (Signed)
Meghan Kerr WUJ:[id number] DOB: 08-30-1951 DOA: 01/28/2024     PCP: Excell Seltzer, MD    Patient arrived to ER on 01/28/24 at 1215 Referred by Attending Therisa Doyne, MD   Patient coming from:    home Lives With family    Chief Complaint:   Chief Complaint  Patient presents with   Transient Ischemic Attack    HPI: Meghan Kerr is a 73 y.o. female with medical history significant of HTN, DM2, HLD, Bells palsy    Presented with  left side weakness and slurred speech New onset of blurred vision, vertigo, slurred speech, left side weakness. Was attending a memorial service Symptoms rapidly resolved No prior hx of CVA    Reports has been under a lot of stress  The even occurred at 10 AM  Only lasted a few minutes  Denies significant ETOH intake   Does not smoke   No results found for: "SARSCOV2NAA"      Regarding pertinent Chronic problems:    Hyperlipidemia -  on statins Crestor Lipid Panel     Component Value Date/Time   CHOL 166 09/22/2023 0835   TRIG 95.0 09/22/2023 0835   HDL 61.80 09/22/2023 0835   CHOLHDL 3 09/22/2023 0835   VLDL 19.0 09/22/2023 0835   LDLCALC 85 09/22/2023 0835   LDLDIRECT 190.1 07/15/2011 0922     HTN on NOrvasc       DM 2 -  Lab Results  Component Value Date   HGBA1C 6.8 (H) 09/22/2023   diet controlled       While in ER:    Neurology  was consulted   MRI showed  1. Small subacute infarcts in the bifrontal subcortical white matter. 2. Chronic small vessel ischemia which has progressed since 2022. There have also been interval but chronic bilateral occipital and left cerebellar infarcts.  Lab Orders         Protime-INR         APTT         CBC         Differential         Comprehensive metabolic panel         Ethanol         Urine rapid drug screen (hosp performed)not at [facility]         Urinalysis, Routine w reflex microscopic -Urine, Clean Catch        MRI brain  subacute  CVA  CXR -  NON acute     Following Medications were ordered in ER: Medications  potassium chloride SA (KLOR-CON M) CR tablet 20 mEq (has no administration in time range)    _______________________________________________________ ER Provider Called:     NEurology   Dr.Stark They Recommend admit to medicine    Will see in  ER     ED Triage Vitals  Encounter Vitals Group     BP 01/28/24 1220 (!) 167/78     Systolic BP Percentile --      Diastolic BP Percentile --      Pulse Rate 01/28/24 1220 69     Resp 01/28/24 1220 16     Temp 01/28/24 1220 98.1 F (36.7 C)     Temp Source 01/28/24 1220 Oral     SpO2 01/28/24 1220 100 %     Weight 01/28/24 1218 145 lb (65.8 kg)     Height 01/28/24 1218 5\' 4"  (1.626 m)     Head Circumference --  Peak Flow --      Pain Score 01/28/24 1217 0     Pain Loc --      Pain Education --      Exclude from Growth Chart --   WUJW(11)@     _________________________________________ Significant initial  Findings: Abnormal Labs Reviewed  APTT - Abnormal; Notable for the following components:      Result Value   aPTT 22 (*)    All other components within normal limits  COMPREHENSIVE METABOLIC PANEL - Abnormal; Notable for the following components:   Potassium 3.4 (*)    Glucose, Bld 193 (*)    Total Protein 6.4 (*)    All other components within normal limits  URINALYSIS, ROUTINE W REFLEX MICROSCOPIC - Abnormal; Notable for the following components:   Glucose, UA 100 (*)    All other components within normal limits         ECG: Ordered Personally reviewed and interpreted by me showing: HR : 69 Rhythm:  NSR,   no evidence of ischemic changes QTC 435   The recent clinical data is shown below. Vitals:   01/28/24 1345 01/28/24 1400 01/28/24 1415 01/28/24 1430  BP:      Pulse: 65 65 64 72  Resp: 18 14 15 17   Temp:      TempSrc:      SpO2: 100% 100% 100% 100%  Weight:      Height:        WBC     Component Value Date/Time   WBC 7.0 01/28/2024 1254    LYMPHSABS 1.4 01/28/2024 1254   LYMPHSABS 1.3 04/07/2023 1003   MONOABS 0.4 01/28/2024 1254   EOSABS 0.1 01/28/2024 1254   EOSABS 0.4 04/07/2023 1003   BASOSABS 0.0 01/28/2024 1254   BASOSABS 0.0 04/07/2023 1003     UA   no evidence of UTI    Urine analysis:    Component Value Date/Time   COLORURINE YELLOW 01/28/2024 1254   APPEARANCEUR CLEAR 01/28/2024 1254   LABSPEC 1.015 01/28/2024 1254   PHURINE 6.0 01/28/2024 1254   GLUCOSEU 100 (A) 01/28/2024 1254   HGBUR NEGATIVE 01/28/2024 1254   BILIRUBINUR NEGATIVE 01/28/2024 1254   KETONESUR NEGATIVE 01/28/2024 1254   PROTEINUR NEGATIVE 01/28/2024 1254   NITRITE NEGATIVE 01/28/2024 1254   LEUKOCYTESUR NEGATIVE 01/28/2024 1254    Results for orders placed or performed in visit on 09/08/17  WOUND CULTURE     Status: None   Collection Time: 09/08/17 11:49 AM   Specimen: Wound  Result Value Ref Range Status   MICRO NUMBER: [id number]  Final   SPECIMEN QUALITY: ADEQUATE  Final   SOURCE: NOT GIVEN  Final   STATUS: FINAL  Final   GRAM STAIN:   Final    No white blood cells seen No epithelial cells seen Few Gram positive cocci in pairs   RESULT:   Final    Growth of skin flora (note: Growth does not include S. aureus, beta-hemolytic Streptococci or P. aeruginosa).    __________________________________________________________ Recent Labs  Lab 01/28/24 1254  NA 138  K 3.4*  CO2 24  GLUCOSE 193*  BUN 16  CREATININE 0.91  CALCIUM 8.9    Cr  stable,  Lab Results  Component Value Date   CREATININE 0.91 01/28/2024   CREATININE 0.88 09/22/2023   CREATININE 0.76 09/21/2022    Recent Labs  Lab 01/28/24 1254  AST 19  ALT 14  ALKPHOS 95  BILITOT 0.4  PROT 6.4*  ALBUMIN  3.7   Lab Results  Component Value Date   CALCIUM 8.9 01/28/2024    Plt: Lab Results  Component Value Date   PLT 190 01/28/2024      Recent Labs  Lab 01/28/24 1254  WBC 7.0  NEUTROABS 5.1  HGB 14.1  HCT 43.7  MCV 92.2  PLT 190    HG/HCT  stable,      Component Value Date/Time   HGB 14.1 01/28/2024 1254   HGB 13.9 04/07/2023 1003   HCT 43.7 01/28/2024 1254   HCT 41.4 04/07/2023 1003   MCV 92.2 01/28/2024 1254   MCV 89 04/07/2023 1003    _______________________________________________ Hospitalist was called for admission for   TIA subacute cva     The following Work up has been ordered so far:  Orders Placed This Encounter  Procedures   MR BRAIN WO CONTRAST   DG Chest Portable 1 View   Protime-INR   APTT   CBC   Differential   Comprehensive metabolic panel   Ethanol   Urine rapid drug screen (hosp performed)not at [facility]   Urinalysis, Routine w reflex microscopic -Urine, Clean Catch   Diet NPO time specified   Vital signs   ED Cardiac monitoring   NIH Stroke Scale   Swallow screen   Initiate Carrier Fluid Protocol   If O2 sat If O2 Sat < 94%, administer O2 at 2 liters/minute via nasal cannula.   Cardiac Monitoring - Continuous Indefinite   Consult to Neuro Hospitalist   Consult to hospitalist   EKG 12-Lead   ED EKG   Saline lock IV   Place in observation (patient's expected length of stay will be less than 2 midnights)     OTHER Significant initial  Findings:  labs showing:     DM  labs:  HbA1C: Recent Labs    09/22/23 0835  HGBA1C 6.8*       CBG (last 3)  No results for input(s): "GLUCAP" in the last 72 hours.        Cultures: No results found for: "SDES", "SPECREQUEST", "CULT", "REPTSTATUS"   Radiological Exams on Admission: DG Chest Portable 1 View Result Date: 01/28/2024 CLINICAL DATA:  Stroke symptoms. EXAM: PORTABLE CHEST 1 VIEW COMPARISON:  01/05/2024. FINDINGS: Trachea is midline. Heart size stable. Calcified granuloma in the left lower lobe. Lungs are otherwise clear. No pleural fluid. IMPRESSION: No acute findings. Electronically Signed   By: Leanna Battles M.D.   On: 01/28/2024 19:24   MR BRAIN WO CONTRAST Result Date: 01/28/2024 CLINICAL DATA:  Neuro deficit with acute  stroke suspected. Dizziness and blurriness. EXAM: MRI HEAD WITHOUT CONTRAST TECHNIQUE: Multiplanar, multiecho pulse sequences of the brain and surrounding structures were obtained without intravenous contrast. COMPARISON:  06/04/2021 FINDINGS: Brain: Small foci of weakly restricted diffusion in the subcortical bifrontal white matter, most consistent with subacute infarcts. No finding in the posterior fossa or along the optic pathways to explain the reported deficits. Progression of chronic small vessel ischemia in the cerebral white matter since prior with interval small chronic bilateral occipital infarcts. Small chronic left cerebellar infarct. No acute hemorrhage, hydrocephalus, or mass. Vascular: Major flow voids are preserved. Skull and upper cervical spine: Normal marrow signal. Sinuses/Orbits: Unremarkable. IMPRESSION: 1. Small subacute infarcts in the bifrontal subcortical white matter. 2. Chronic small vessel ischemia which has progressed since 2022. There have also been interval but chronic bilateral occipital and left cerebellar infarcts. Electronically Signed   By: Tiburcio Pea M.D.   On: 01/28/2024 17:06  _______________________________________________________________________________________________________ Latest  Blood pressure (!) 167/78, pulse 72, temperature 98.1 F (36.7 C), temperature source Oral, resp. rate 17, height 5\' 4"  (1.626 m), weight 65.8 kg, SpO2 100%.   Vitals  labs and radiology finding personally reviewed  Review of Systems:    Pertinent positives include:   slurred speech localizing neurological complaints Constitutional:  No weight loss, night sweats, Fevers, chills, fatigue, weight loss  HEENT:  No headaches, Difficulty swallowing,Tooth/dental problems,Sore throat,  No sneezing, itching, ear ache, nasal congestion, post nasal drip,  Cardio-vascular:  No chest pain, Orthopnea, PND, anasarca, dizziness, palpitations.no Bilateral lower extremity swelling  GI:   No heartburn, indigestion, abdominal pain, nausea, vomiting, diarrhea, change in bowel habits, loss of appetite, melena, blood in stool, hematemesis Resp:  no shortness of breath at rest. No dyspnea on exertion, No excess mucus, no productive cough, No non-productive cough, No coughing up of blood.No change in color of mucus.No wheezing. Skin:  no rash or lesions. No jaundice GU:  no dysuria, change in color of urine, no urgency or frequency. No straining to urinate.  No flank pain.  Musculoskeletal:  No joint pain or no joint swelling. No decreased range of motion. No back pain.  Psych:  No change in mood or affect. No depression or anxiety. No memory loss.  Neuro: no , no tingling, no weakness, no double vision, no gait abnormality, no, no confusion  All systems reviewed and apart from HOPI all are negative _______________________________________________________________________________________________ Past Medical History:   Past Medical History:  Diagnosis Date   Hyperlipidemia    Hypertension      Past Surgical History:  Procedure Laterality Date   APPENDECTOMY     ECTOPIC PREGNANCY SURGERY  1976   HAMMER TOE SURGERY Left 08/19/2017   hardware removed April 2019   TRIGGER FINGER RELEASE  2006   WRIST FRACTURE SURGERY     broken left wrist repair with plate    Social History:  Ambulatory   independently      reports that she has never smoked. She has never used smokeless tobacco. She reports that she does not drink alcohol and does not use drugs.     Family History:   Family History  Problem Relation Age of Onset   Diabetes Mother    Heart disease Mother        cad and chf   Heart disease Father        heart attack (massive)   Cancer Maternal Grandmother        COLON   Stroke Maternal Grandfather    Diabetes Brother    Heart disease Brother 68       hert attack   Heart disease Brother 63       CABG    Stroke Sister    Heart disease Sister         VALVE REPLACEMENT   Breast cancer Sister    Breast cancer Cousin 50   ______________________________________________________________________________________________ Allergies: Allergies  Allergen Reactions   Amoxicillin     REACTION: rash   Penicillins    Sulfa Antibiotics Other (See Comments)   Sulfonamide Derivatives     REACTION: Blisters on leg    Prior to Admission medications   Medication Sig Start Date End Date Taking? Authorizing Provider  amLODipine (NORVASC) 5 MG tablet TAKE 1 TABLET BY MOUTH EVERY DAY 11/15/23   Bedsole, Amy E, MD  Apoaequorin (PREVAGEN PO) Take 1 tablet by mouth daily.    [provider]  Blood  Glucose Monitoring Suppl (ONE TOUCH ULTRA MINI) w/Device KIT USE TO CHECK BLOOD SUGAR DAILY 08/24/23   Bedsole, Amy E, MD  Fluocinolone Acetonide Body 0.01 % OIL Apply topically to itchy areas on scalp twice daily as needed. 09/14/23   Willeen Niece, MD  glucose blood (ONETOUCH ULTRA) test strip USE TO CHECK BLOOD SUGAR DAILY 08/24/23   Ermalene Searing, Amy E, MD  Lancets (ONETOUCH ULTRASOFT) lancets USE TO CHECK BLOOD SUGAR DAILY 08/24/23   Bedsole, Amy E, MD  mupirocin ointment (BACTROBAN) 2 % Apply 1 Application topically as directed. Bid to ulcers on lower abdomen and cover with bandage, do this for 1 week then decrease to qd until healed 01/03/24   Willeen Niece, MD  rosuvastatin (CRESTOR) 5 MG tablet TAKE 1 TABLET BY MOUTH EVERY OTHER DAY 11/18/23   Bedsole, Amy E, MD  traMADol (ULTRAM) 50 MG tablet Take 1 tablet (50 mg total) by mouth every 6 (six) hours as needed for moderate pain (pain score 4-6). 01/05/24   Bedsole, Amy E, MD  triamcinolone cream (KENALOG) 0.5 % Apply 1 Application topically 2 (two) times daily. 11/15/23   Excell Seltzer, MD  Vitamin D, Cholecalciferol, 10 MCG (400 UNIT) CHEW Chew 1 tablet by mouth daily.    [provider]    ___________________________________________________________________________________________________ Physical  Exam:    01/28/2024    2:30 PM 01/28/2024    2:15 PM 01/28/2024    2:00 PM  Vitals with BMI  Pulse 72 64 65     1. General:  in No  Acute distress   well   -appearing 2. Psychological: Alert and   Oriented 3. Head/ENT:   Moist   Mucous Membranes                          Head Non traumatic, neck supple                           Poor Dentition 4. SKIN:  decreased Skin turgor,  Skin clean Dry and intact no rash    5. Heart: Regular rate and rhythm no  Murmur, no Rub or gallop 6. Lungs:  , no wheezes or crackles   7. Abdomen: Soft,  non-tender, Non distended bowel sounds present 8. Lower extremities: no clubbing, cyanosis, no  edema 9. Neurologically strength 5 out of 5 in all 4 extremities cranial nerves II through XII intact 10. MSK: Normal range of motion    Chart has been reviewed  ______________________________________________________________________________________________  Assessment/Plan 73 y.o. female with medical history significant of HTN, DM2, HLD, Bells palsy   Admitted for   TIA /subacute cva   Present on Admission:  TIA (transient ischemic attack)  Hyperlipidemia associated with type 2 diabetes mellitus (HCC)  Type 2 diabetes mellitus with other circulatory complications (HTN) (HCC)  Essential hypertension     Hyperlipidemia associated with type 2 diabetes mellitus (HCC) Continue Crestor 5 mg po q day  Type 2 diabetes mellitus with other circulatory complications (HTN) (HCC)  - Order Sensitive  SSI    -  check TSH and HgA1C  - Hold by mouth medications    Essential hypertension Allow permissive hypertension  TIA (transient ischemic attack)  - will admit based on TIA/CVA protocol,        Monitor on Tele        MRI   Resulted - showing subacute ischemic CVA  CTA ordered,         Echo to evaluate for possible embolic source,        obtain cardiac enzymes,  ECG,   Lipid panel, TSH.        Order PT/OT evaluation.        Passed swallow eva         Will make sure patient is on antiplatelet ASA 81   defer to Neurology if needs Plavix agent continue statin        Allow permissive Hypertension keep BP <220/120        Neurology consulted     Other plan as per orders.  DVT prophylaxis:  SCD      Code Status:    Code Status: Not on file FULL CODE as per patient   I had personally discussed CODE STATUS with patient   ACP   none    Family Communication:   Family not at  Bedside   Diet  Diet Orders (From admission, onward)     Start     Ordered   01/28/24 1911  Diet Carb Modified Fluid consistency: Thin; Room service appropriate? Yes  Diet effective now       Question Answer Comment  Diet-HS Snack? Nothing   Calorie Level Medium 1600-2000   Fluid consistency: Thin   Room service appropriate? Yes      01/28/24 1910            Disposition Plan:    To home once workup is complete and patient is stable   Following barriers for discharge:                              Stroke   work up is complete                            Electrolytes corrected                                                        Will need consultants to evaluate patient prior to discharge       Consult Orders  (From admission, onward)           Start     Ordered   01/28/24 1808  Consult to hospitalist  Once       Provider:  (Not yet assigned)  Question Answer Comment  Place call to: [facility]   Reason for Consult Admit      01/28/24 1807   01/28/24 1729  Consult to Neuro Hospitalist  Paged by Velna Hatchet  Once       Provider:  (Not yet assigned)  Question Answer Comment  Place call to: Neuro Hospitalist on call   Reason for Consult Admit      01/28/24 1728                               Would benefit from PT/OT eval prior to DC  Ordered                     Consults called: Neurology      Admission status:  ED Disposition  ED Disposition  Admit   Condition  --   Comment  Hospital Area: [facility]  [[id number]]  Level of Care: Telemetry Medical [[id number]]  May place patient in observation at [facility] or [facility] if equivalent level of care is available:: No  Covid Evaluation: Asymptomatic - no recent exposure (last 10 days) testing not required  Diagnosis: TIA (transient ischemic attack) [161096]  Admitting Physician: Therisa Doyne [[id number]]  Attending Physician: Therisa Doyne [[id number]]          Obs      Level of care     tele  indefinitely please discontinue once patient no longer qualifies COVID-19 Labs    Erik Nessel 01/28/2024, 8:40 PM    [facility]     after 2 AM please page floor coverage PA If 7AM-7PM, please contact the day team taking care of the patient using [email or web address]

## 2024-01-28 NOTE — ED Provider Notes (Signed)
[place] [facility] AT [facility] Provider Note   CSN: [id number] Arrival date & time: 01/28/24  1215     History  Chief Complaint  Patient presents with   Transient Ischemic Attack    Meghan Kerr is a 73 y.o. female.  HPI Adult female with medical problem including hypercholesterolemia, Bell's palsy, macular degeneration presents via EMS with concern for acute onset dizziness and blurred vision.  Patient was at church, appearing for a [employer] when she felt symptoms.  Symptoms were momentary, resolved prior to transfer here and she is currently asymptomatic.  She denies a history of TIA/stroke, has problems as above, and is scheduled for cataract surgery later this week.    Home Medications Prior to Admission medications   Medication Sig Start Date End Date Taking? Authorizing Provider  amLODipine (NORVASC) 5 MG tablet TAKE 1 TABLET BY MOUTH EVERY DAY 11/15/23   Bedsole, Amy E, MD  Apoaequorin (PREVAGEN PO) Take 1 tablet by mouth daily.    [provider]  Blood Glucose Monitoring Suppl (ONE TOUCH ULTRA MINI) w/Device KIT USE TO CHECK BLOOD SUGAR DAILY 08/24/23   Bedsole, Amy E, MD  Fluocinolone Acetonide Body 0.01 % OIL Apply topically to itchy areas on scalp twice daily as needed. 09/14/23   Willeen Niece, MD  glucose blood (ONETOUCH ULTRA) test strip USE TO CHECK BLOOD SUGAR DAILY 08/24/23   Ermalene Searing, Amy E, MD  Lancets (ONETOUCH ULTRASOFT) lancets USE TO CHECK BLOOD SUGAR DAILY 08/24/23   Bedsole, Amy E, MD  mupirocin ointment (BACTROBAN) 2 % Apply 1 Application topically as directed. Bid to ulcers on lower abdomen and cover with bandage, do this for 1 week then decrease to qd until healed 01/03/24   Willeen Niece, MD  rosuvastatin (CRESTOR) 5 MG tablet TAKE 1 TABLET BY MOUTH EVERY OTHER DAY 11/18/23   Bedsole, Amy E, MD  traMADol (ULTRAM) 50 MG tablet Take 1 tablet (50 mg total) by mouth every 6 (six) hours as needed for moderate pain (pain  score 4-6). 01/05/24   Bedsole, Amy E, MD  triamcinolone cream (KENALOG) 0.5 % Apply 1 Application topically 2 (two) times daily. 11/15/23   Excell Seltzer, MD  Vitamin D, Cholecalciferol, 10 MCG (400 UNIT) CHEW Chew 1 tablet by mouth daily.    [provider]      Allergies    Amoxicillin, Penicillins, Sulfa antibiotics, and Sulfonamide derivatives    Review of Systems   Review of Systems  Physical Exam Updated Vital Signs BP (!) 167/78 (BP Location: Right Arm)   Pulse 72   Temp 98.1 F (36.7 C) (Oral)   Resp 17   Ht 5\' 4"  (1.626 m)   Wt 65.8 kg   SpO2 100%   BMI 24.89 kg/m  Physical Exam Vitals and nursing note reviewed.  Constitutional:      General: She is not in acute distress.    Appearance: She is well-developed.  HENT:     Head: Normocephalic and atraumatic.  Eyes:     Conjunctiva/sclera: Conjunctivae normal.  Cardiovascular:     Rate and Rhythm: Normal rate and regular rhythm.  Pulmonary:     Effort: Pulmonary effort is normal. No respiratory distress.     Breath sounds: Normal breath sounds. No stridor.  Abdominal:     General: There is no distension.  Skin:    General: Skin is warm and dry.  Neurological:     Mental Status: She is alert and oriented to  person, place, and time.     Cranial Nerves: No cranial nerve deficit.     Motor: No weakness.     Coordination: Coordination normal.  Psychiatric:        Mood and Affect: Mood normal.     ED Results / Procedures / Treatments   Labs (all labs ordered are listed, but only abnormal results are displayed) Labs Reviewed  APTT - Abnormal; Notable for the following components:      Result Value   aPTT 22 (*)    All other components within normal limits  COMPREHENSIVE METABOLIC PANEL - Abnormal; Notable for the following components:   Potassium 3.4 (*)    Glucose, Bld 193 (*)    Total Protein 6.4 (*)    All other components within normal limits  URINALYSIS, ROUTINE W REFLEX MICROSCOPIC -  Abnormal; Notable for the following components:   Glucose, UA 100 (*)    All other components within normal limits  PROTIME-INR  CBC  DIFFERENTIAL  ETHANOL  RAPID URINE DRUG SCREEN, HOSP PERFORMED    EKG EKG Interpretation Date/Time:  Saturday January 28 2024 12:22:44 EST Ventricular Rate:  69 PR Interval:  184 QRS Duration:  105 QT Interval:  406 QTC Calculation: 435 R Axis:   79  Text Interpretation: Sinus rhythm Artifact Confirmed by Gerhard Munch [phone]) on 01/28/2024 12:36:06 PM  Radiology MR BRAIN WO CONTRAST Result Date: 01/28/2024 CLINICAL DATA:  Neuro deficit with acute stroke suspected. Dizziness and blurriness. EXAM: MRI HEAD WITHOUT CONTRAST TECHNIQUE: Multiplanar, multiecho pulse sequences of the brain and surrounding structures were obtained without intravenous contrast. COMPARISON:  06/04/2021 FINDINGS: Brain: Small foci of weakly restricted diffusion in the subcortical bifrontal white matter, most consistent with subacute infarcts. No finding in the posterior fossa or along the optic pathways to explain the reported deficits. Progression of chronic small vessel ischemia in the cerebral white matter since prior with interval small chronic bilateral occipital infarcts. Small chronic left cerebellar infarct. No acute hemorrhage, hydrocephalus, or mass. Vascular: Major flow voids are preserved. Skull and upper cervical spine: Normal marrow signal. Sinuses/Orbits: Unremarkable. IMPRESSION: 1. Small subacute infarcts in the bifrontal subcortical white matter. 2. Chronic small vessel ischemia which has progressed since 2022. There have also been interval but chronic bilateral occipital and left cerebellar infarcts. Electronically Signed   By: Tiburcio Pea M.D.   On: 01/28/2024 17:06    Procedures Procedures    Medications Ordered in ED Medications - No data to display  ED Course/ Medical Decision Making/ A&P                                 Medical Decision  Making Patient with risk profile for stroke with hypercholesterolemia, diabetes presents after an episode of transient neurologic change.  Differential includes TIA versus symptomatic macular degeneration/dizziness.  Less likely intracranial abnormality such as mass.  With NIH of 0, some reassurance was taken initially.  MRI labs monitoring x-ray ordered. Cardiac 70 sinus normal Pulse ox 100% room air normal   Amount and/or Complexity of Data Reviewed Independent Historian: EMS External Data Reviewed: notes. Labs: ordered. Decision-making details documented in ED Course. Radiology: ordered and independent interpretation performed. Decision-making details documented in ED Course. ECG/medicine tests: ordered and independent interpretation performed. Decision-making details documented in ED Course.  Risk Prescription drug management. Decision regarding hospitalization. Diagnosis or treatment significantly limited by social determinants of health.   6:07 PM Patient  now accompanied by multiple family members.  I have reviewed her MRI, labs, and discussed them with our neurology team.  With concern for subacute stroke on MRI, and acute change in neurologic function earlier today, which may have been that versus TIA, patient will require admission for further monitoring, management.        Final Clinical Impression(s) / ED Diagnoses Final diagnoses:  TIA (transient ischemic attack)     Gerhard Munch, MD 01/28/24 [phone]

## 2024-01-28 NOTE — ED Notes (Addendum)
 Assumed pt care.

## 2024-01-28 NOTE — ED Notes (Signed)
 Pt ambulated to restroom with steady gait.

## 2024-01-28 NOTE — ED Triage Notes (Signed)
Pt bib ems from church c/o stroke. Pt had a new onset blurred vision, acute dizziness, slurred speech and left sided weakness. No stroke hx NIH 0 once ems arrived. All symptoms have resolved.   HR 80 BP 168/80 (HTN hx) RR 16 CBG 140 RA 100

## 2024-01-28 NOTE — Consult Note (Signed)
NEUROLOGY CONSULT NOTE   Date of service: January 28, 2024 Patient Name: Meghan Kerr MRN:  [id number] DOB:  23-Jan-1951 Chief Complaint: "L sided weakness" Requesting Provider: Therisa Doyne, MD  History of Present Illness  Meghan Kerr is a 73 y.o. female with hx of Htn, HLD, prior bells palsy, DM2, who presents with a episode of left sided weakness, slurred speech at 10AM.  Came to the ED where MRI Brain demonstrated Small subacute infarcts in the bifrontal subcortical white matter.  No prior hx of similar symptoms. No hx of stroke, no family hx of stroke. Does not smoke, no recreational substances.  LKW: 1000AM Modified rankin score: 0-Completely asymptomatic and back to baseline post- stroke IV Thrombolysis: not offered, resolution of symptoms with no deficits. EVT: not offered, no LVO  NIHSS components Score: Comment  1a Level of Conscious 0[x]  1[]  2[]  3[]      1b LOC Questions 0[x]  1[]  2[]       1c LOC Commands 0[x]  1[]  2[]       2 Best Gaze 0[x]  1[]  2[]       3 Visual 0[x]  1[]  2[]  3[]      4 Facial Palsy 0[x]  1[]  2[]  3[]      5a Motor Arm - left 0[x]  1[]  2[]  3[]  4[]  UN[]    5b Motor Arm - Right 0[x]  1[]  2[]  3[]  4[]  UN[]    6a Motor Leg - Left 0[x]  1[]  2[]  3[]  4[]  UN[]    6b Motor Leg - Right 0[x]  1[]  2[]  3[]  4[]  UN[]    7 Limb Ataxia 0[x]  1[]  2[]  3[]  UN[]     8 Sensory 0[x]  1[]  2[]  UN[]      9 Best Language 0[x]  1[]  2[]  3[]      10 Dysarthria 0[x]  1[]  2[]  UN[]      11 Extinct. and Inattention 0[x]  1[]  2[]       TOTAL: 0      ROS  Comprehensive ROS performed and pertinent positives documented in HPI   Past History   Past Medical History:  Diagnosis Date   Hyperlipidemia    Hypertension     Past Surgical History:  Procedure Laterality Date   APPENDECTOMY     ECTOPIC PREGNANCY SURGERY  1976   HAMMER TOE SURGERY Left 08/19/2017   hardware removed April 2019   TRIGGER FINGER RELEASE  2006   WRIST FRACTURE SURGERY     broken left wrist repair with plate     Family History: Family History  Problem Relation Age of Onset   Diabetes Mother    Heart disease Mother        cad and chf   Heart disease Father        heart attack (massive)   Cancer Maternal Grandmother        COLON   Stroke Maternal Grandfather    Diabetes Brother    Heart disease Brother 17       hert attack   Heart disease Brother 81       CABG    Stroke Sister    Heart disease Sister        VALVE REPLACEMENT   Breast cancer Sister    Breast cancer Cousin 1    Social History  reports that she has never smoked. She has never used smokeless tobacco. She reports that she does not drink alcohol and does not use drugs.  Allergies  Allergen Reactions   Sulfa Antibiotics Hives and Other (See Comments)    Blisters   Sulfonamide Derivatives Hives and Other (See  Comments)    REACTION: Blisters on leg   Amoxicillin Rash   Penicillins Rash    Medications   Current Facility-Administered Medications:    insulin aspart (novoLOG) injection 0-9 Units, 0-9 Units, Subcutaneous, Q4H, Doutova, Anastassia, MD  Current Outpatient Medications:    acetaminophen (TYLENOL) 500 MG tablet, Take 1,000 mg by mouth every 6 (six) hours as needed for mild pain (pain score 1-3), fever or headache., Disp: , Rfl:    amLODipine (NORVASC) 5 MG tablet, TAKE 1 TABLET BY MOUTH EVERY DAY, Disp: 90 tablet, Rfl: 3   Multiple Vitamin (MULTIVITAMIN WITH MINERALS) TABS tablet, Take 1 tablet by mouth daily., Disp: , Rfl:    mupirocin ointment (BACTROBAN) 2 %, Apply 1 Application topically as directed. Bid to ulcers on lower abdomen and cover with bandage, do this for 1 week then decrease to qd until healed, Disp: 22 g, Rfl: 2   rosuvastatin (CRESTOR) 5 MG tablet, TAKE 1 TABLET BY MOUTH EVERY OTHER DAY, Disp: 45 tablet, Rfl: 3   Vitamin D, Cholecalciferol, 10 MCG (400 UNIT) CHEW, Chew 1 tablet by mouth daily., Disp: , Rfl:    Blood Glucose Monitoring Suppl (ONE TOUCH ULTRA MINI) w/Device KIT, USE TO CHECK  BLOOD SUGAR DAILY, Disp: 1 kit, Rfl: 0   glucose blood (ONETOUCH ULTRA) test strip, USE TO CHECK BLOOD SUGAR DAILY, Disp: 100 each, Rfl: 3   Lancets (ONETOUCH ULTRASOFT) lancets, USE TO CHECK BLOOD SUGAR DAILY, Disp: 100 each, Rfl: 3  Vitals   Vitals:   01/28/24 1400 01/28/24 1415 01/28/24 1430 01/28/24 1907  BP:      Pulse: 65 64 72   Resp: 14 15 17    Temp:    98.1 F (36.7 C)  TempSrc:    Oral  SpO2: 100% 100% 100%   Weight:      Height:        Body mass index is 24.89 kg/m.  Physical Exam   General: Laying comfortably in bed; in no acute distress.  HENT: Normal oropharynx and mucosa. Normal external appearance of ears and nose.  Neck: Supple, no pain or tenderness  CV: No JVD. No peripheral edema.  Pulmonary: Symmetric Chest rise. Normal respiratory effort.  Abdomen: Soft to touch, non-tender.  Ext: No cyanosis, edema, or deformity  Skin: No rash. Normal palpation of skin.   Musculoskeletal: Normal digits and nails by inspection. No clubbing.   Neurologic Examination  Mental status/Cognition: Alert, oriented to self, place, month and year, good attention.  Speech/language: Fluent, comprehension intact, object naming intact, repetition intact.  Cranial nerves:   CN II Pupils equal and reactive to light, no VF deficits    CN III,IV,VI EOM intact, no gaze preference or deviation, no nystagmus    CN V normal sensation in V1, V2, and V3 segments bilaterally    CN VII no asymmetry, no nasolabial fold flattening    CN VIII normal hearing to speech    CN IX & X normal palatal elevation, no uvular deviation    CN XI 5/5 head turn and 5/5 shoulder shrug bilaterally    CN XII midline tongue protrusion    Motor:  Muscle bulk: normal, tone normal, pronator drift none tremor none Mvmt Root Nerve  Muscle Right Left Comments  SA C5/6 Ax Deltoid 5 5   EF C5/6 Mc Biceps 5 5   EE C6/7/8 Rad Triceps 5 5   WF C6/7 Med FCR     WE C7/8 PIN ECU     F  Ab C8/T1 U ADM/FDI 5 5   HF  L1/2/3 Fem Illopsoas 5 5   KE L2/3/4 Fem Quad 5 5   DF L4/5 D Peron Tib Ant 5 5   PF S1/2 Tibial Grc/Sol 5 5    Sensation:  Light touch Intact throughout   Pin prick    Temperature    Vibration   Proprioception    Coordination/Complex Motor:  - Finger to Nose intact BL - Heel to shin intact BL - Rapid alternating movement are normal - Gait: deferred.  Labs/Imaging/Neurodiagnostic studies   CBC:  Recent Labs  Lab 02-08-2024 1254  WBC 7.0  NEUTROABS 5.1  HGB 14.1  HCT 43.7  MCV 92.2  PLT 190   Basic Metabolic Panel:  Lab Results  Component Value Date   NA 138 2024/02/08   K 3.4 (L) Feb 08, 2024   CO2 24 2024-02-08   GLUCOSE 193 (H) 2024-02-08   BUN 16 02/08/2024   CREATININE 0.91 2024-02-08   CALCIUM 8.9 02/08/24   GFRNONAA >60 02/08/2024   GFRAA 110 02/20/2009   Lipid Panel:  Lab Results  Component Value Date   LDLCALC 85 09/22/2023   HgbA1c:  Lab Results  Component Value Date   HGBA1C 6.9 (H) 02-08-24   Urine Drug Screen:     Component Value Date/Time   LABOPIA NONE DETECTED February 08, 2024 1254   COCAINSCRNUR NONE DETECTED 02/08/24 1254   LABBENZ NONE DETECTED 02/08/24 1254   AMPHETMU NONE DETECTED 2024/02/08 1254   THCU NONE DETECTED 2024/02/08 1254   LABBARB NONE DETECTED 02-08-24 1254    Alcohol Level     Component Value Date/Time   ETH <10 02/08/2024 1254   INR  Lab Results  Component Value Date   INR 1.0 02/08/2024   APTT  Lab Results  Component Value Date   APTT 22 (L) 2024/02/08   AED levels: No results found for: "PHENYTOIN", "ZONISAMIDE", "LAMOTRIGINE", "LEVETIRACETA"  CT Head without contrast(Personally reviewed): CTH was negative for a large hypodensity concerning for a large territory infarct or hyperdensity concerning for an ICH  CT angio Head and Neck with contrast(Personally reviewed): No LVO  MRI Brain(Personally reviewed): 1. Small subacute infarcts in the bifrontal subcortical white matter.  ASSESSMENT    Meghan Kerr is a 73 y.o. female with hx of Htn, HLD, prior bells palsy, DM2, who presents with a episode of left sided weakness, slurred speech at 10AM.  Came to the ED where MRI Brain demonstrated Small subacute infarcts in the bifrontal subcortical white matter.  RECOMMENDATIONS  - Frequent Neuro checks per stroke unit protocol - Recommend obtaining TTE  - Recommend obtaining Lipid panel with LDL - Please start statin if LDL > 70 - Recommend HbA1c to evaluate for diabetes and how well it is controlled. - Antithrombotic - Aspirin 81mg  daily along with plavix 75mg  daily x 21 days, followed by Aspirin 81mg  daily alone. - Recommend DVT ppx - SBP goal - permissive hypertension first 24 h < 220/110. Held home meds.  - Recommend Telemetry monitoring for arrythmia - Recommend bedside swallow screen prior to PO intake. - Stroke education booklet - Recommend PT/OT/SLP consult   ______________________________________________________________________    Signed, Erick Blinks, MD [facility]

## 2024-01-29 ENCOUNTER — Observation Stay (HOSPITAL_BASED_OUTPATIENT_CLINIC_OR_DEPARTMENT_OTHER): Payer: Medicare Other

## 2024-01-29 ENCOUNTER — Other Ambulatory Visit: Payer: Self-pay | Admitting: Home Health

## 2024-01-29 ENCOUNTER — Observation Stay (HOSPITAL_COMMUNITY): Payer: Medicare Other

## 2024-01-29 DIAGNOSIS — I10 Essential (primary) hypertension: Secondary | ICD-10-CM | POA: Diagnosis not present

## 2024-01-29 DIAGNOSIS — I639 Cerebral infarction, unspecified: Secondary | ICD-10-CM | POA: Diagnosis not present

## 2024-01-29 DIAGNOSIS — G459 Transient cerebral ischemic attack, unspecified: Secondary | ICD-10-CM | POA: Diagnosis not present

## 2024-01-29 LAB — ECHOCARDIOGRAM COMPLETE
AR max vel: 2.55 cm2
AV Area VTI: 2.71 cm2
AV Area mean vel: 2.6 cm2
AV Mean grad: 4 mm[Hg]
AV Peak grad: 6.9 mm[Hg]
Ao pk vel: 1.31 m/s
Area-P 1/2: 2.62 cm2
Height: 64 in
S' Lateral: 2.2 cm
Weight: 2320 [oz_av]

## 2024-01-29 LAB — LIPID PANEL
Cholesterol: 169 mg/dL (ref 0–200)
HDL: 67 mg/dL (ref >40)
LDL Cholesterol: 87 mg/dL (ref 0–99)
Total CHOL/HDL Ratio: 2.5 {ratio}
Triglycerides: 74 mg/dL (ref <150)
VLDL: 15 mg/dL (ref 0–40)

## 2024-01-29 LAB — CBG MONITORING, ED
Glucose-Capillary: 128 mg/dL — ABNORMAL HIGH (ref 70–99)
Glucose-Capillary: 125 mg/dL — ABNORMAL HIGH (ref 70–99)
Glucose-Capillary: 178 mg/dL — ABNORMAL HIGH (ref 70–99)

## 2024-01-29 MED ORDER — ASPIRIN 81 MG PO TBEC: 81.0000 mg | DELAYED_RELEASE_TABLET | Freq: Every day | ORAL | 12 refills | Status: AC

## 2024-01-29 MED ORDER — CLOPIDOGREL BISULFATE 75 MG PO TABS: 75.0000 mg | ORAL_TABLET | Freq: Every day | ORAL | 0 refills | Status: DC

## 2024-01-29 MED ORDER — ACETAMINOPHEN 160 MG/5ML PO SOLN: 650.0000 mg | ORAL | Status: DC | PRN

## 2024-01-29 MED ORDER — SODIUM CHLORIDE 0.9 % IV SOLN: INTRAVENOUS | Status: DC

## 2024-01-29 MED ORDER — ACETAMINOPHEN 650 MG RE SUPP: 650.0000 mg | RECTAL | Status: DC | PRN

## 2024-01-29 MED ORDER — TRAMADOL HCL 50 MG PO TABS: 50.0000 mg | ORAL_TABLET | Freq: Four times a day (QID) | ORAL | Status: DC | PRN

## 2024-01-29 MED ORDER — POTASSIUM CHLORIDE 20 MEQ PO PACK: 40.0000 meq | PACK | Freq: Once | ORAL | Status: DC

## 2024-01-29 MED ORDER — CLOPIDOGREL BISULFATE 75 MG PO TABS
75.0000 mg | ORAL_TABLET | Freq: Every day | ORAL | Status: DC
  Administered 2024-01-29: 75 mg via ORAL
  Filled 2024-01-29: qty 1

## 2024-01-29 MED ORDER — ROSUVASTATIN CALCIUM 5 MG PO TABS
5.0000 mg | ORAL_TABLET | ORAL | Status: DC
  Administered 2024-01-29: 5 mg via ORAL
  Filled 2024-01-29: qty 1

## 2024-01-29 MED ORDER — ASPIRIN 81 MG PO TBEC
81.0000 mg | DELAYED_RELEASE_TABLET | Freq: Every day | ORAL | Status: DC
  Administered 2024-01-29: 81 mg via ORAL
  Filled 2024-01-29: qty 1

## 2024-01-29 MED ORDER — ACETAMINOPHEN 325 MG PO TABS: 650.0000 mg | ORAL_TABLET | ORAL | Status: DC | PRN

## 2024-01-29 MED ORDER — STROKE: EARLY STAGES OF RECOVERY BOOK
Freq: Once | Status: AC
  Filled 2024-01-29: qty 1

## 2024-01-29 NOTE — Care Management Obs Status (Signed)
MEDICARE OBSERVATION STATUS NOTIFICATION   Patient Details  Name: NATALLIA STELLMACH MRN: [id number] Date of Birth: 1951/09/12   Medicare Observation Status Notification Given:  Yes    Lockie Pares, RN 01/29/2024, 11:26 AM

## 2024-01-29 NOTE — Progress Notes (Signed)
VASCULAR LAB    Bilateral lower extremity venous duplex has been performed.  See CV proc for preliminary results.   Elon Eoff, RVT 01/29/2024, 2:57 PM

## 2024-01-29 NOTE — Progress Notes (Addendum)
STROKE TEAM PROGRESS NOTE   BRIEF HPI [name] is a 73 y.o. female with PMH significant for hypertension, hyperlipidemia, prior Bell's palsy, DM2 who presented to the ED 2/1 due to transient episode of left-sided weakness and slurred speech.  She was completely asymptomatic and back to baseline on neurological assessment in ED. MRI brain showed small subacute infarcts in the bifrontal subcortical white matter.   NIH on Admission: 0  INTERIM HISTORY/SUBJECTIVE  On exam, patient is sitting up in chair at bedside.  She is ambulating in ED room.  No weakness or slurred speech seen on exam.  No other focal deficits, confusion or sensory deficits seen.  Plan of care discussed thoroughly with the patient.  PENDING DVT US and ECHO.   OBJECTIVE  CBC    Component Value Date/Time   WBC 7.0 01/28/2024 1254   RBC 4.74 01/28/2024 1254   HGB 14.1 01/28/2024 1254   HGB 13.9 04/07/2023 1003   HCT 43.7 01/28/2024 1254   HCT 41.4 04/07/2023 1003   PLT 190 01/28/2024 1254   PLT 285 04/07/2023 1003   MCV 92.2 01/28/2024 1254   MCV 89 04/07/2023 1003   MCH 29.7 01/28/2024 1254   MCHC 32.3 01/28/2024 1254   RDW 12.0 01/28/2024 1254   RDW 11.9 04/07/2023 1003   LYMPHSABS 1.4 01/28/2024 1254   LYMPHSABS 1.3 04/07/2023 1003   MONOABS 0.4 01/28/2024 1254   EOSABS 0.1 01/28/2024 1254   EOSABS 0.4 04/07/2023 1003   BASOSABS 0.0 01/28/2024 1254   BASOSABS 0.0 04/07/2023 1003    BMET    Component Value Date/Time   NA 138 01/28/2024 1254   NA 139 03/25/2011 0000   K 3.4 (L) 01/28/2024 1254   CL 105 01/28/2024 1254   CO2 24 01/28/2024 1254   GLUCOSE 193 (H) 01/28/2024 1254   BUN 16 01/28/2024 1254   BUN 17 03/25/2011 0000   CREATININE 0.91 01/28/2024 1254   CALCIUM 8.9 01/28/2024 1254   GFRNONAA >60 01/28/2024 1254    IMAGING past 24 hours CT ANGIO HEAD NECK W WO CM Result Date: 01/28/2024 CLINICAL DATA:  Neuro deficit, acute, stroke suspected EXAM: CT ANGIOGRAPHY HEAD AND NECK  WITH AND WITHOUT CONTRAST TECHNIQUE: Multidetector CT imaging of the head and neck was performed using the standard protocol during bolus administration of intravenous contrast. Multiplanar CT image reconstructions and MIPs were obtained to evaluate the vascular anatomy. Carotid stenosis measurements (when applicable) are obtained utilizing NASCET criteria, using the distal internal carotid diameter as the denominator. RADIATION DOSE REDUCTION: This exam was performed according to the departmental dose-optimization program which includes automated exposure control, adjustment of the mA and/or kV according to patient size and/or use of iterative reconstruction technique. CONTRAST:  75mL OMNIPAQUE IOHEXOL 350 MG/ML SOLN COMPARISON:  MRI head January 28, 2024. FINDINGS: CT HEAD FINDINGS Brain: Known small acute infarcts better characterized on same day MRI. No evidence of acute hemorrhage, hydrocephalus, extra-axial collection or mass lesion/mass effect. Patchy white matter hypodensities are nonspecific but compatible with chronic microvascular disease. Remote left cerebellar infarct. Vascular: See below. Skull: No acute fracture. Sinuses/Orbits: Clear sinuses.  No acute orbital findings. Review of the MIP images confirms the above findings CTA NECK FINDINGS Aortic arch: Great vessel origins are patent without significant stenosis. Right carotid system: No evidence of dissection, stenosis (50% or greater), or occlusion. Left carotid system: No evidence of dissection, stenosis (50% or greater), or occlusion. Vertebral arteries: Left dominant. No evidence of dissection, stenosis (50% or  greater), or occlusion. Skeleton: No acute abnormality limited assessment. Remote appearing T1 superior endplate fracture. Other neck: Negative. Upper chest: Visualized lung apices are clear. Review of the MIP images confirms the above findings CTA HEAD FINDINGS Anterior circulation: Bilateral intracranial ICAs, MCAs, and ACAs are patent  without proximal hemodynamically significant stenosis. Mild bilateral intracranial ICA stenosis. Hypoplastic left A1 ACA. Posterior circulation: Bilateral intradural vertebral arteries are patent. Severe stenosis of the intradural vertebral arteries bilaterally due to atherosclerosis. Basilar artery and bilateral posterior cerebral arteries are patent without proximal hemodynamically significant stenosis. Right fetal type PCA, anatomic variant. Venous sinuses: As permitted by contrast timing, patent. Limited evaluation due to arterial timing. Review of the MIP images confirms the above findings IMPRESSION: 1. No emergent large vessel occlusion. 2. Severe bilateral intradural vertebral artery stenosis. Electronically Signed   By: Feliberto Harts M.D.   On: 01/28/2024 20:46   DG Chest Portable 1 View Result Date: 01/28/2024 CLINICAL DATA:  Stroke symptoms. EXAM: PORTABLE CHEST 1 VIEW COMPARISON:  01/05/2024. FINDINGS: Trachea is midline. Heart size stable. Calcified granuloma in the left lower lobe. Lungs are otherwise clear. No pleural fluid. IMPRESSION: No acute findings. Electronically Signed   By: Leanna Battles M.D.   On: 01/28/2024 19:24   MR BRAIN WO CONTRAST Result Date: 01/28/2024 CLINICAL DATA:  Neuro deficit with acute stroke suspected. Dizziness and blurriness. EXAM: MRI HEAD WITHOUT CONTRAST TECHNIQUE: Multiplanar, multiecho pulse sequences of the brain and surrounding structures were obtained without intravenous contrast. COMPARISON:  06/04/2021 FINDINGS: Brain: Small foci of weakly restricted diffusion in the subcortical bifrontal white matter, most consistent with subacute infarcts. No finding in the posterior fossa or along the optic pathways to explain the reported deficits. Progression of chronic small vessel ischemia in the cerebral white matter since prior with interval small chronic bilateral occipital infarcts. Small chronic left cerebellar infarct. No acute hemorrhage, hydrocephalus, or  mass. Vascular: Major flow voids are preserved. Skull and upper cervical spine: Normal marrow signal. Sinuses/Orbits: Unremarkable. IMPRESSION: 1. Small subacute infarcts in the bifrontal subcortical white matter. 2. Chronic small vessel ischemia which has progressed since 2022. There have also been interval but chronic bilateral occipital and left cerebellar infarcts. Electronically Signed   By: Tiburcio Pea M.D.   On: 01/28/2024 17:06    Vitals:   01/29/24 7829 01/29/24 0924 01/29/24 0925 01/29/24 1151  BP: (!) 152/76 (!) 152/83 (!) 142/70 (!) 163/73  Pulse:    70  Resp:    14  Temp:    98.6 F (37 C)  TempSrc:    Oral  SpO2:    100%  Weight:      Height:         PHYSICAL EXAM General:  Alert, well-nourished, well-developed patient in no acute distress Psych:  Cooperative, but restless.  CV: Regular rate and rhythm on monitor Respiratory:  Regular, unlabored respirations on room air GI: Abdomen soft and nontender   NEURO:  Mental Status: AA&Ox3, patient is able to give clear and coherent history Speech/Language: speech is without dysarthria or aphasia.  Naming, repetition, fluency, and comprehension intact.  Cranial Nerves:  II: PERRL. Visual fields full.  III, IV, VI: EOMI. Eyelids elevate symmetrically.  V: Sensation is intact to light touch and symmetrical to face.  VII: Face is symmetrical resting and smiling VIII: hearing intact to voice. IX, X: Palate elevates symmetrically. Phonation is normal.  FA:[id number] shrug 5/5. XII: tongue is midline without fasciculations. Motor: 5/5 strength to all muscle groups tested.  Tone: is normal and bulk is normal Sensation- Intact to light touch bilaterally. Extinction absent to light touch to DSS.   Coordination: FTN intact bilaterally, HKS: no ataxia in BLE.No drift.  Gait- deferred  Most Recent NIH: 0     ASSESSMENT/PLAN  Acute/subacute bilateral frontal small MCA/ACA watershed infarcts, etiology: ?hypotension with  hypoplastic ACA on imaging vs. Small vessel disease, can not completely rule out cardioembolic source CTA head & neck No emergent LVO, Severe bilateral intradural vertebral artery stenosis. Hypoplastic left A1 ACA, Mild bilateral intracranial ICA stenosis MRI Small subacute infarcts bilateral subcortical white matter, Chronic small vessel disease, progressed since 2022. Interval but chronic bilateral occipital and left cerebellar infarcts VAS [place] LE DVT no DVT 2D Echo: PENDING Recommend 30-day cardiac event monitoring as outpatient LDL 87 HgbA1c 6.9 UDS neg VTE prophylaxis - SCDs No antithrombotic prior to admission, now on aspirin 81 mg daily and clopidogrel 75 mg daily for 3 weeks and then aspirin alone. Therapy recommendations:  No follow up needed  Disposition:  likely dc home  Episodes of dizziness/vertigo ? Vertebrobasilar insufficiency Pt stated that she has dizziness episodes 1-2 per week, sometimes with standing to long, sometimes with getting up too quickly, sometimes vertiginous CTA head & neck Severe bilateral intradural vertebral artery stenosis.  On DAPT now Avoid low BP Orthostatic vitals unremarkable today  Hypertension Home meds:  amlodipine 5mg  Stable on the high end Avoid hypotension. BP goal normotensive  Orthostatic vitals unremarkable  Hyperlipidemia Home meds:  Crestor 5mg  LDL 87, goal < 70 Increase to Crestor 10mg , no high intensity statin given advanced age and LDL not far from goal Continue statin at discharge  Diabetes type II Uncontrolled Home meds:  insulin HgbA1c 6.9, goal < 7.0 CBGs SSI Recommend close follow-up with PCP for better DM control  Other Stroke Risk Factors Advanced age Family hx stroke (sister, maternal grandfather) History of migraine, last migraine many years ago   Hospital day # 0   Pt seen by Neuro NP/APP and later by MD. Note/plan to be edited by MD as needed.    Lynnae January, DNP, AGACNP-BC [facility] Please use AMION for contact information & EPIC for messaging.  ATTENDING NOTE: I reviewed above note and agree with the assessment and plan. Pt was seen and examined.   No family bedside.  Patient sitting in chair, eager to go home.  Neuroexam intact.  MRI showed bilateral acute/subacute frontal small infarcts.  Etiology unclear, could be from small vessel disease versus hypertension in the setting of hypoplastic ACA.  Recommend 30-day CardioNet monitoring as outpatient.  Patient does have bilateral V4 high-grade stenosis and episodes of dizziness/vertigo, questionable for vertebrobasilar insufficiency.  Continue DAPT for 3 weeks and then aspirin alone.  Increase Crestor from 5 to 10mg .  PT and OT recommendation.  For detailed assessment and plan, please refer to above/below as I have made changes wherever appropriate.   Neurology will sign off. Please call with questions. Pt will follow up with stroke clinic NP at [facility] in about 4 weeks. Thanks for the consult.   Marvel Plan, MD PhD Stroke Neurology 01/29/2024 3:00 PM     To contact Stroke Continuity provider, please refer to [email or web address]. After hours, contact General Neurology

## 2024-01-29 NOTE — Progress Notes (Incomplete)
PROGRESS NOTE    Meghan Kerr  ZOX:[id number] DOB: Mar 16, 1951 DOA: 01/28/2024 PCP: Excell Seltzer, MD  Outpatient Specialists:     Brief Narrative:  ***   Assessment & Plan:   Principal Problem:   TIA (transient ischemic attack) Active Problems:   Type 2 diabetes mellitus with other circulatory complications (HTN) (HCC)   Hyperlipidemia associated with type 2 diabetes mellitus (HCC)   Essential hypertension   ***   DVT prophylaxis: *** Code Status: *** Family Communication: *** Disposition Plan: ***   Consultants:  ***  Procedures:  ***  Antimicrobials:  ***    Subjective: ***  Objective: Vitals:   01/29/24 0500 01/29/24 0600 01/29/24 0700 01/29/24 0800  BP: (!) 161/78 113/81 (!) 151/80 (!) 147/82  Pulse: 62 72  62  Resp:    16  Temp:    98.5 F (36.9 C)  TempSrc:      SpO2: 97% 98%  100%  Weight:      Height:       No intake or output data in the 24 hours ending 01/29/24 0947 Filed Weights   01/28/24 1218  Weight: 65.8 kg    Examination:  General exam: Appears calm and comfortable  Respiratory system: Clear to auscultation. Respiratory effort normal. Cardiovascular system: S1 & S2 heard, RRR. No JVD, murmurs, rubs, gallops or clicks. No pedal edema. Gastrointestinal system: Abdomen is nondistended, soft and nontender. No organomegaly or masses felt. Normal bowel sounds heard. Central nervous system: Alert and oriented. No focal neurological deficits. Extremities: Symmetric 5 x 5 power. Skin: No rashes, lesions or ulcers Psychiatry: Judgement and insight appear normal. Mood & affect appropriate.     Data Reviewed: I have personally reviewed following labs and imaging studies  CBC: Recent Labs  Lab 01/28/24 1254  WBC 7.0  NEUTROABS 5.1  HGB 14.1  HCT 43.7  MCV 92.2  PLT 190   Basic Metabolic Panel: Recent Labs  Lab 01/28/24 1254  NA 138  K 3.4*  CL 105  CO2 24  GLUCOSE 193*  BUN 16  CREATININE 0.91  CALCIUM 8.9   MG 2.2  PHOS 3.5   GFR: Estimated Creatinine Clearance: 50.6 mL/min (by C-G formula based on SCr of 0.91 mg/dL). Liver Function Tests: Recent Labs  Lab 01/28/24 1254  AST 19  ALT 14  ALKPHOS 95  BILITOT 0.4  PROT 6.4*  ALBUMIN 3.7   No results for input(s): "LIPASE", "AMYLASE" in the last 168 hours. No results for input(s): "AMMONIA" in the last 168 hours. Coagulation Profile: Recent Labs  Lab 01/28/24 1254  INR 1.0   Cardiac Enzymes: No results for input(s): "CKTOTAL", "CKMB", "CKMBINDEX", "TROPONINI" in the last 168 hours. BNP (last 3 results) No results for input(s): "PROBNP" in the last 8760 hours. HbA1C: Recent Labs    01/28/24 1254  HGBA1C 6.9*   CBG: Recent Labs  Lab 01/28/24 2150 01/29/24 0753  GLUCAP 126* 128*   Lipid Profile: No results for input(s): "CHOL", "HDL", "LDLCALC", "TRIG", "CHOLHDL", "LDLDIRECT" in the last 72 hours. Thyroid Function Tests: Recent Labs    01/28/24 1254  TSH 1.121   Anemia Panel: No results for input(s): "VITAMINB12", "FOLATE", "FERRITIN", "TIBC", "IRON", "RETICCTPCT" in the last 72 hours. Urine analysis:    Component Value Date/Time   COLORURINE YELLOW 01/28/2024 1254   APPEARANCEUR CLEAR 01/28/2024 1254   LABSPEC 1.015 01/28/2024 1254   PHURINE 6.0 01/28/2024 1254   GLUCOSEU 100 (A) 01/28/2024 1254   HGBUR NEGATIVE 01/28/2024 1254  BILIRUBINUR NEGATIVE 01/28/2024 1254   KETONESUR NEGATIVE 01/28/2024 1254   PROTEINUR NEGATIVE 01/28/2024 1254   NITRITE NEGATIVE 01/28/2024 1254   LEUKOCYTESUR NEGATIVE 01/28/2024 1254   Sepsis Labs: @LABRCNTIP (procalcitonin:4,lacticidven:4)  )No results found for this or any previous visit (from the past 240 hours).       Radiology Studies: CT ANGIO HEAD NECK W WO CM Result Date: 01/28/2024 CLINICAL DATA:  Neuro deficit, acute, stroke suspected EXAM: CT ANGIOGRAPHY HEAD AND NECK WITH AND WITHOUT CONTRAST TECHNIQUE: Multidetector CT imaging of the head and neck was  performed using the standard protocol during bolus administration of intravenous contrast. Multiplanar CT image reconstructions and MIPs were obtained to evaluate the vascular anatomy. Carotid stenosis measurements (when applicable) are obtained utilizing NASCET criteria, using the distal internal carotid diameter as the denominator. RADIATION DOSE REDUCTION: This exam was performed according to the departmental dose-optimization program which includes automated exposure control, adjustment of the mA and/or kV according to patient size and/or use of iterative reconstruction technique. CONTRAST:  75mL OMNIPAQUE IOHEXOL 350 MG/ML SOLN COMPARISON:  MRI head January 28, 2024. FINDINGS: CT HEAD FINDINGS Brain: Known small acute infarcts better characterized on same day MRI. No evidence of acute hemorrhage, hydrocephalus, extra-axial collection or mass lesion/mass effect. Patchy white matter hypodensities are nonspecific but compatible with chronic microvascular disease. Remote left cerebellar infarct. Vascular: See below. Skull: No acute fracture. Sinuses/Orbits: Clear sinuses.  No acute orbital findings. Review of the MIP images confirms the above findings CTA NECK FINDINGS Aortic arch: Great vessel origins are patent without significant stenosis. Right carotid system: No evidence of dissection, stenosis (50% or greater), or occlusion. Left carotid system: No evidence of dissection, stenosis (50% or greater), or occlusion. Vertebral arteries: Left dominant. No evidence of dissection, stenosis (50% or greater), or occlusion. Skeleton: No acute abnormality limited assessment. Remote appearing T1 superior endplate fracture. Other neck: Negative. Upper chest: Visualized lung apices are clear. Review of the MIP images confirms the above findings CTA HEAD FINDINGS Anterior circulation: Bilateral intracranial ICAs, MCAs, and ACAs are patent without proximal hemodynamically significant stenosis. Mild bilateral intracranial ICA  stenosis. Hypoplastic left A1 ACA. Posterior circulation: Bilateral intradural vertebral arteries are patent. Severe stenosis of the intradural vertebral arteries bilaterally due to atherosclerosis. Basilar artery and bilateral posterior cerebral arteries are patent without proximal hemodynamically significant stenosis. Right fetal type PCA, anatomic variant. Venous sinuses: As permitted by contrast timing, patent. Limited evaluation due to arterial timing. Review of the MIP images confirms the above findings IMPRESSION: 1. No emergent large vessel occlusion. 2. Severe bilateral intradural vertebral artery stenosis. Electronically Signed   By: Feliberto Harts M.D.   On: 01/28/2024 20:46   DG Chest Portable 1 View Result Date: 01/28/2024 CLINICAL DATA:  Stroke symptoms. EXAM: PORTABLE CHEST 1 VIEW COMPARISON:  01/05/2024. FINDINGS: Trachea is midline. Heart size stable. Calcified granuloma in the left lower lobe. Lungs are otherwise clear. No pleural fluid. IMPRESSION: No acute findings. Electronically Signed   By: Leanna Battles M.D.   On: 01/28/2024 19:24   MR BRAIN WO CONTRAST Result Date: 01/28/2024 CLINICAL DATA:  Neuro deficit with acute stroke suspected. Dizziness and blurriness. EXAM: MRI HEAD WITHOUT CONTRAST TECHNIQUE: Multiplanar, multiecho pulse sequences of the brain and surrounding structures were obtained without intravenous contrast. COMPARISON:  06/04/2021 FINDINGS: Brain: Small foci of weakly restricted diffusion in the subcortical bifrontal white matter, most consistent with subacute infarcts. No finding in the posterior fossa or along the optic pathways to explain the reported deficits. Progression of  chronic small vessel ischemia in the cerebral white matter since prior with interval small chronic bilateral occipital infarcts. Small chronic left cerebellar infarct. No acute hemorrhage, hydrocephalus, or mass. Vascular: Major flow voids are preserved. Skull and upper cervical spine: Normal  marrow signal. Sinuses/Orbits: Unremarkable. IMPRESSION: 1. Small subacute infarcts in the bifrontal subcortical white matter. 2. Chronic small vessel ischemia which has progressed since 2022. There have also been interval but chronic bilateral occipital and left cerebellar infarcts. Electronically Signed   By: Tiburcio Pea M.D.   On: 01/28/2024 17:06        Scheduled Meds:   stroke: early stages of recovery book   Does not apply Once   aspirin EC  81 mg Oral Daily   clopidogrel  75 mg Oral Daily   insulin aspart  0-9 Units Subcutaneous Q4H   rosuvastatin  5 mg Oral QODAY   Continuous Infusions:   LOS: 0 days    Time spent: ***    Berton Mount, MD  [facility] Pager #: [phone] 7PM-7AM contact night coverage as above

## 2024-01-29 NOTE — Progress Notes (Signed)
Pt received from ED at 1601 axoxo4 on room air no distress noted. Ambulatory and independent. Pt requests to go home. Spoke with MD pt was to d/c today from ED after her echo results were read but was sent to floor. Pt now has d/c orders in. Will d/c patient to home.

## 2024-01-29 NOTE — Discharge Summary (Signed)
 Physician Discharge Summary  Patient ID: Meghan Kerr MRN: [id number] DOB/AGE: [birth year] 73 y.o.  Admit date: 01/28/2024 Discharge date: 01/29/2024  Admission Diagnoses:  Discharge Diagnoses:  Principal Problem:   TIA (transient ischemic attack) Active Problems:   Type 2 diabetes mellitus with other circulatory complications (HTN) (HCC)   Hyperlipidemia associated with type 2 diabetes mellitus (HCC)   Essential hypertension   Discharged Condition: stable  Hospital Course:  Meghan Kerr is a 73 y.o. female with PMH significant for hypertension, hyperlipidemia, prior Bell's palsy, DM2 who presented to the ED 2/1 due to transient episode of left-sided weakness and slurred speech.  She was completely asymptomatic and back to baseline on neurological assessment in ED. MRI brain showed small subacute infarcts in the bifrontal subcortical white matter.   NIH on Admission: 0  Acute/subacute bilateral frontal small MCA/ACA watershed infarcts, etiology: ?hypotension with hypoplastic ACA on imaging vs. Small vessel disease, can not completely rule out cardioembolic source CTA head & neck No emergent LVO, Severe bilateral intradural vertebral artery stenosis. Hypoplastic left A1 ACA, Mild bilateral intracranial ICA stenosis MRI Small subacute infarcts bilateral subcortical white matter, Chronic small vessel disease, progressed since 2022. Interval but chronic bilateral occipital and left cerebellar infarcts VAS [place] LE DVT no DVT 2D Echo: PENDING Recommend 30-day cardiac event monitoring as outpatient LDL 87 HgbA1c 6.9 UDS neg VTE prophylaxis - SCDs No antithrombotic prior to admission, now on aspirin 81 mg daily and clopidogrel 75 mg daily for 3 weeks and then aspirin alone. Therapy recommendations:  No follow up needed  Disposition:  likely dc home   Episodes of dizziness/vertigo ? Vertebrobasilar insufficiency Pt stated that she has dizziness episodes 1-2 per week, sometimes  with standing to long, sometimes with getting up too quickly, sometimes vertiginous CTA head & neck Severe bilateral intradural vertebral artery stenosis.  On DAPT now Avoid low BP Orthostatic vitals unremarkable today   Hypertension Home meds:  amlodipine 5mg  Stable on the high end Avoid hypotension. BP goal normotensive  Orthostatic vitals unremarkable   Hyperlipidemia Home meds:  Crestor 5mg  LDL 87, goal < 70 Increase to Crestor 10mg , no high intensity statin given advanced age and LDL not far from goal Continue statin at discharge   Diabetes type II Uncontrolled Home meds:  insulin HgbA1c 6.9, goal < 7.0 CBGs SSI Recommend close follow-up with PCP for better DM control   Other Stroke Risk Factors Advanced age Family hx stroke (sister, maternal grandfather) History of migraine, last migraine many years ago      Consults: neurology  Significant Diagnostic Studies:   Treatments: Continue aspirin and Plavix for 21 days, and afterwards, continue only aspirin.  Discharge Exam: Blood pressure (!) 159/72, pulse 76, temperature 98.1 F (36.7 C), temperature source Oral, resp. rate 17, height 5\' 4"  (1.626 m), weight 65.8 kg, SpO2 100%.   Disposition: Discharge disposition: 01-Home or Self Care       Discharge Instructions     Ambulatory referral to Neurology   Complete by: As directed    Follow up with stroke clinic NP (Jessica Vanschaick or Darrol Angel, if both not available, consider Manson Allan, or Ahern) at [facility] in about 4 weeks. Thanks.   Diet - low sodium heart healthy   Complete by: As directed    Increase activity slowly   Complete by: As directed       Allergies as of 01/29/2024       Reactions   Sulfa Antibiotics Hives, Other (See Comments)  Blisters   Sulfonamide Derivatives Hives, Other (See Comments)   REACTION: Blisters on leg   Amoxicillin Rash   Penicillins Rash        Medication List     STOP taking these medications     mupirocin ointment 2 % Commonly known as: BACTROBAN       TAKE these medications    acetaminophen 500 MG tablet Commonly known as: TYLENOL Take 1,000 mg by mouth every 6 (six) hours as needed for mild pain (pain score 1-3), fever or headache.   amLODipine 5 MG tablet Commonly known as: NORVASC TAKE 1 TABLET BY MOUTH EVERY DAY   aspirin EC 81 MG tablet Take 1 tablet (81 mg total) by mouth daily. Swallow whole. Start taking on: January 30, 2024   clopidogrel 75 MG tablet Commonly known as: PLAVIX Take 1 tablet (75 mg total) by mouth daily. Start taking on: January 30, 2024   multivitamin with minerals Tabs tablet Take 1 tablet by mouth daily.   ONE TOUCH ULTRA MINI w/Device Kit USE TO CHECK BLOOD SUGAR DAILY   OneTouch Ultra test strip Generic drug: glucose blood USE TO CHECK BLOOD SUGAR DAILY   onetouch ultrasoft lancets USE TO CHECK BLOOD SUGAR DAILY   rosuvastatin 5 MG tablet Commonly known as: CRESTOR TAKE 1 TABLET BY MOUTH EVERY OTHER DAY   Vitamin D (Cholecalciferol) 10 MCG (400 UNIT) Chew Chew 1 tablet by mouth daily.        Follow-up Information     [facility]. Schedule an appointment as soon as possible for a visit in 1 month(s).   Specialty: Neurology Why: stroke clinic Contact information: [address] [place] [address] [phone]               Time spent: 35 minutes.  SignedBarnetta Chapel 01/29/2024, 4:19 PM

## 2024-01-29 NOTE — Evaluation (Signed)
Physical Therapy Evaluation Patient Details Name: KRYSTEL FLETCHALL MRN: [id number] DOB: 1951-12-01 Today's Date: 01/29/2024  History of Present Illness  The pt is a 73 yo female presenting 2/1 with blurred vision, dizziness, slurred speech, and L-sided weakness, sx resolved by arrival at ED. MRI showed small infarcts in bifrontal subcortical white matter. PMH includes: HTN, DM II, HLD, Bell'ss Palsy, and cataracts.   Clinical Impression  Pt in bed upon arrival of PT, agreeable to evaluation at this time. Prior to admission the pt was independent with mobility without use of DME, she reports one fall in December but no other issues. She was educated in [facility] FAST but pt demos poor recall or understanding. Pt is able to ambulate without assistance and demos good stability even with balance challenges, but poor insight to directions, finding her way back to her room, and did not recognize her room or belongings upon arrival. Pt demos good physical strength and stability and has no further acute PT needs, but will benefit from continued supervision after d/c home due to impairments in cognition (memory, awareness, orientation). Will benefit from OT and SLP cog assessment prior to return home.       If plan is discharge home, recommend the following: Supervision due to cognitive status;Direct supervision/assist for medications management;Assistance with cooking/housework;Direct supervision/assist for financial management;Help with stairs or ramp for entrance   Can travel by private vehicle        Equipment Recommendations None recommended by PT  Recommendations for Other Services       Functional Status Assessment Patient has had a recent decline in their functional status and demonstrates the ability to make significant improvements in function in a reasonable and predictable amount of time.     Precautions / Restrictions Precautions Precautions: Fall Restrictions Weight Bearing Restrictions Per  Provider Order: No      Mobility  Bed Mobility Overal bed mobility: Independent                  Transfers Overall transfer level: Independent Equipment used: None                    Ambulation/Gait Ambulation/Gait assistance: Supervision Gait Distance (Feet): 200 Feet Assistive device: None Gait Pattern/deviations: WFL(Within Functional Limits) Gait velocity: WFL Gait velocity interpretation: 1.31 - 2.62 ft/sec, indicative of limited community ambulator   General Gait Details: good speed, stable despite balance challenge. poor wayfinding and understanding of direction  Modified Rankin (Stroke Patients Only) Modified Rankin (Stroke Patients Only) Pre-Morbid Rankin Score: No symptoms Modified Rankin: Moderate disability     Balance Overall balance assessment: Mild deficits observed, not formally tested                               Standardized Balance Assessment Standardized Balance Assessment : Dynamic Gait Index   Dynamic Gait Index Level Surface: Normal Change in Gait Speed: Normal Gait with Horizontal Head Turns: Normal Gait with Vertical Head Turns: Mild Impairment Gait and Pivot Turn: Normal Step Over Obstacle: Mild Impairment Step Around Obstacles: Normal Steps: Mild Impairment Total Score: 21       Pertinent Vitals/Pain Pain Assessment Pain Assessment: No/denies pain    Home Living Family/patient expects to be discharged to:: Private residence Living Arrangements: Spouse/significant other Available Help at Discharge: Family;Available 24 hours/day Type of Home: House Home Access: Level entry     Alternate Level Stairs-Number of Steps: flight Home Layout: Two  level;Bed/bath upstairs Home Equipment: None      Prior Function Prior Level of Function : Independent/Modified Independent             Mobility Comments: pt reports single fall in december, no use of DME ADLs Comments: independent, not driving due to  vision issues     Extremity/Trunk Assessment   Upper Extremity Assessment Upper Extremity Assessment: Overall WFL for tasks assessed    Lower Extremity Assessment Lower Extremity Assessment: Overall WFL for tasks assessed    Cervical / Trunk Assessment Cervical / Trunk Assessment: Normal  Communication   Communication Communication: No apparent difficulties Cueing Techniques: Verbal cues  Cognition Arousal: Alert Behavior During Therapy: WFL for tasks assessed/performed Overall Cognitive Status: Impaired/Different from baseline Area of Impairment: Orientation, Attention, Memory, Following commands, Safety/judgement, Awareness, Problem solving                 Orientation Level: Disoriented to, Place, Situation Current Attention Level: Focused Memory: Decreased short-term memory Following Commands: Follows one step commands consistently, Follows multi-step commands inconsistently Safety/Judgement: Decreased awareness of safety, Decreased awareness of deficits Awareness: Intellectual Problem Solving: Decreased initiation, Requires verbal cues General Comments: pt needing frequent and repeated re-orientation, educated on sign/sx of stroke with little recall. no family present to confirm baseline. thought she saw a "small dog" when walking in the hallway, and asked if there was a person in the bed (ED stretcher in her room) and was worried about waking them        General Comments General comments (skin integrity, edema, etc.): met with spouse after session and he reports he works during the day, will work on Geophysical data processor for cog, pt with some STM issues        Assessment/Plan    PT Assessment Patient does not need any further PT services         PT Goals (Current goals can be found in the Care Plan section)  Acute Rehab PT Goals Patient Stated Goal: return home PT Goal Formulation: With patient Time For Goal Achievement: 02/12/24 Potential to Achieve  Goals: Good     AM-PAC PT "6 Clicks" Mobility  Outcome Measure Help needed turning from your back to your side while in a flat bed without using bedrails?: None Help needed moving from lying on your back to sitting on the side of a flat bed without using bedrails?: None Help needed moving to and from a bed to a chair (including a wheelchair)?: None Help needed standing up from a chair using your arms (e.g., wheelchair or bedside chair)?: None Help needed to walk in hospital room?: None Help needed climbing 3-5 steps with a railing? : A Little 6 Click Score: 23    End of Session Equipment Utilized During Treatment: Gait belt Activity Tolerance: Patient tolerated treatment well Patient left:  (standing in room) Nurse Communication: Mobility status PT Visit Diagnosis: Unsteadiness on feet (R26.81)    Time: 1610-9604 PT Time Calculation (min) (ACUTE ONLY): 21 min   Charges:   PT Evaluation $PT Eval Moderate Complexity: 1 Mod   PT General Charges $$ ACUTE PT VISIT: 1 Visit         Vickki Muff, PT, DPT   Acute Rehabilitation Department Office [phone] Secure Chat Communication Preferred  Ronnie Derby 01/29/2024, 2:11 PM

## 2024-01-29 NOTE — Progress Notes (Signed)
Requested by neurology and hospitalist to arrange 30-day event monitor due to recent stroke, this will be mailed to the patient, new patient follow-up arranged with Dr. Rennis Golden on 03/02/2024, see AVS.

## 2024-01-31 ENCOUNTER — Telehealth: Payer: Self-pay

## 2024-01-31 NOTE — Transitions of Care (Post Inpatient/ED Visit) (Signed)
   01/31/2024  Name: Meghan Kerr MRN: [id number] DOB: 25-Jul-1951  Today's TOC FU Call Status: Today's TOC FU Call Status:: Successful TOC FU Call Completed TOC FU Call Complete Date: 01/31/24 Patient's Name and Date of Birth confirmed.  Transition Care Management Follow-up Telephone Call Date of Discharge: 01/29/24 Discharge Facility: [facility]) Type of Discharge: Inpatient Admission Primary Inpatient Discharge Diagnosis:: TIA How have you been since you were released from the hospital?: Better Any questions or concerns?: No  Items Reviewed: Did you receive and understand the discharge instructions provided?: Yes Medications obtained,verified, and reconciled?: Yes (Medications Reviewed) Any new allergies since your discharge?: No Dietary orders reviewed?: Yes Type of Diet Ordered:: heart healthy Do you have support at home?: Yes People in Home: spouse  Medications Reviewed Today: Medications Reviewed Today     Reviewed by Angeligue Bowne, [place] LABOR, CMA (Certified Medical Assistant) on 01/31/24 at 1117  Med List Status: <None>   Medication Order Taking? Sig Documenting Provider Last Dose Status Informant  acetaminophen  (TYLENOL ) 500 MG tablet [id number] Yes Take 1,000 mg by mouth every 6 (six) hours as needed for mild pain (pain score 1-3), fever or headache. [provider] Taking Active Self, Pharmacy Records  amLODipine  (NORVASC ) 5 MG tablet [id number] Yes TAKE 1 TABLET BY MOUTH EVERY DAY Bedsole, Amy E, MD Taking Active Self, Pharmacy Records  aspirin  EC 81 MG tablet [id number] Yes Take 1 tablet (81 mg total) by mouth daily. Swallow whole. Rosario Leatrice FERNS, MD Taking Active   Blood Glucose Monitoring Suppl (ONE TOUCH ULTRA MINI) w/Device PRESSLEY [id number] Yes USE TO CHECK BLOOD SUGAR DAILY Avelina, Amy E, MD Taking Active Self, Pharmacy Records  clopidogrel  (PLAVIX ) 75 MG tablet [id number] Yes Take 1 tablet (75 mg total) by mouth daily. Rosario Leatrice FERNS, MD Taking Active    glucose blood (ONETOUCH ULTRA) test strip [id number] Yes USE TO CHECK BLOOD SUGAR DAILY Avelina Greig BRAVO, MD Taking Active Self, Pharmacy Records  Lancets [facility] ULTRASOFT) lancets [id number] Yes USE TO CHECK BLOOD SUGAR DAILY Avelina Greig BRAVO, MD Taking Active Self, Pharmacy Records  Multiple Vitamin (MULTIVITAMIN WITH MINERALS) TABS tablet [id number] Yes Take 1 tablet by mouth daily. [provider] Taking Active Self, Pharmacy Records  rosuvastatin  (CRESTOR ) 5 MG tablet [id number] Yes TAKE 1 TABLET BY MOUTH EVERY OTHER DAY Avelina, Amy E, MD Taking Active Self, Pharmacy Records  Vitamin D , Cholecalciferol, 10 MCG (400 UNIT) CHEW [id number] Yes Chew 1 tablet by mouth daily. [provider] Taking Active Self, Pharmacy Records            Home Care and Equipment/Supplies: Were Home Health Services Ordered?: NA Any new equipment or medical supplies ordered?: NA  Functional Questionnaire: Do you need assistance with bathing/showering or dressing?: No Do you need assistance with meal preparation?: No Do you need assistance with eating?: No Do you have difficulty maintaining continence: No Do you need assistance with getting out of bed/getting out of a chair/moving?: No Do you have difficulty managing or taking your medications?: No  Follow up appointments reviewed: PCP Follow-up appointment confirmed?: Yes Date of PCP follow-up appointment?: 02/02/24 Follow-up Provider: Greig Avelina, MD Specialist Hospital Follow-up appointment confirmed?: Yes Date of Specialist follow-up appointment?: 02/27/24 Follow-Up Specialty Provider:: [facility] Do you need transportation to your follow-up appointment?: No Do you understand care options if your condition(s) worsen?: Yes-patient verbalized understanding    Sherman Lipuma, CMA  [facility] AWV Team Direct Dial: [phone]

## 2024-02-01 ENCOUNTER — Ambulatory Visit: Payer: Self-pay | Admitting: Plastic Surgery

## 2024-02-01 VITALS — BP 177/80 | HR 61

## 2024-02-01 DIAGNOSIS — Q178 Other specified congenital malformations of ear: Secondary | ICD-10-CM

## 2024-02-01 NOTE — Progress Notes (Signed)
 Procedure Note  Preoperative Dx: Split right earlobe  Postoperative Dx: Same  Procedure: Repair of right earlobe  Anesthesia: Lidocaine 1% with 1:100,000 epinephrine and 0.25% Sensorcaine   Indication for Procedure: Repair of split earlobe  Description of Procedure: Risks and complications were explained to the patient including the possibility of recurrence.  Consent was confirmed and the patient understands the risks and benefits.  The potential complications and alternatives were explained and the patient consents.  The patient expressed understanding the option of not having the procedure and the risks of a scar.  Time out was called and all information was confirmed to be correct.    The area was prepped and drapped.  Local anesthetic was injected in the subcutaneous tissues.  After waiting for the local to take affect the epithelialized edges of the defect were excised sharply..  After obtaining hemostasis, the surgical wound was closed with 4-0 Monocryl in the subcutaneous tissues and 5-0 Prolene sutures and the skin.  The surgical wound measured 1 cm.  A dressing was applied.  The patient was given instructions on how to care for the area and a follow up appointment.  Meghan Kerr tolerated the procedure well and there were no complications. The specimen was sent to pathology.

## 2024-02-02 ENCOUNTER — Encounter: Payer: Self-pay | Admitting: Family Medicine

## 2024-02-02 ENCOUNTER — Ambulatory Visit (INDEPENDENT_AMBULATORY_CARE_PROVIDER_SITE_OTHER): Payer: Medicare Other | Admitting: Family Medicine

## 2024-02-02 VITALS — BP 142/80 | HR 58 | Temp 97.9°F | Ht 64.5 in | Wt 146.4 lb

## 2024-02-02 DIAGNOSIS — E876 Hypokalemia: Secondary | ICD-10-CM | POA: Diagnosis not present

## 2024-02-02 DIAGNOSIS — I10 Essential (primary) hypertension: Secondary | ICD-10-CM

## 2024-02-02 DIAGNOSIS — I5189 Other ill-defined heart diseases: Secondary | ICD-10-CM | POA: Diagnosis not present

## 2024-02-02 DIAGNOSIS — G459 Transient cerebral ischemic attack, unspecified: Secondary | ICD-10-CM

## 2024-02-02 DIAGNOSIS — R200 Anesthesia of skin: Secondary | ICD-10-CM

## 2024-02-02 DIAGNOSIS — Z8673 Personal history of transient ischemic attack (TIA), and cerebral infarction without residual deficits: Secondary | ICD-10-CM | POA: Diagnosis not present

## 2024-02-02 DIAGNOSIS — E1159 Type 2 diabetes mellitus with other circulatory complications: Secondary | ICD-10-CM

## 2024-02-02 DIAGNOSIS — E785 Hyperlipidemia, unspecified: Secondary | ICD-10-CM

## 2024-02-02 DIAGNOSIS — R9082 White matter disease, unspecified: Secondary | ICD-10-CM

## 2024-02-02 DIAGNOSIS — I998 Other disorder of circulatory system: Secondary | ICD-10-CM

## 2024-02-02 DIAGNOSIS — E1169 Type 2 diabetes mellitus with other specified complication: Secondary | ICD-10-CM

## 2024-02-02 LAB — BASIC METABOLIC PANEL
BUN: 17 mg/dL (ref 6–23)
CO2: 29 meq/L (ref 19–32)
Calcium: 9.4 mg/dL (ref 8.4–10.5)
Chloride: 106 meq/L (ref 96–112)
Creatinine, Ser: 0.84 mg/dL (ref 0.40–1.20)
GFR: 68.43 mL/min (ref >60.00)
Glucose, Bld: 140 mg/dL — ABNORMAL HIGH (ref 70–99)
Potassium: 4.2 meq/L (ref 3.5–5.1)
Sodium: 144 meq/L (ref 135–145)

## 2024-02-02 MED ORDER — ONETOUCH ULTRASOFT 2 LANCETS MISC: 1.0000 | Freq: Every day | 0 refills | Status: DC

## 2024-02-02 NOTE — Progress Notes (Signed)
 Patient ID: Meghan Kerr, female    DOB: 16-Apr-1951, 73 y.o.   MRN: [id number]  This visit was conducted in person.  BP (!) 142/80 (BP Location: Left Arm, Patient Position: Sitting, Cuff Size: Normal)   Pulse (!) 58   Temp 97.9 F (36.6 C) (Temporal)   Ht 5' 4.5 (1.638 m)   Wt 146 lb 6.4 oz (66.4 kg)   SpO2 99%   BMI 24.74 kg/m    CC:  Chief Complaint  Patient presents with   Follow-up    ER follow up    Subjective:   HPI: Meghan Kerr is a 73 y.o. female presenting on 02/02/2024 for Follow-up (ER follow up)  Reviewed recent ED note to hospital admission from January 28, 2024 She presented to ER with left-sided weakness and slurred speech, blurred vision and vertigo that occurred during a [employer].  Symptoms rapidly resolved   Lab evaluation was unremarkable. Neurology was consulted. MRI showed  1.  Small subacute infarcts in bifrontal subcortical white matter 2.  Chronic small vessel ischemia which has progressed since 2022, interval but chronic bilateral occipital and left cerebellar infarcts  Echo: 01/29/2024: Ejection fraction 60 to 65% grade 1 diastolic dysfunction Bilateral venous ultrasound: No evidence of DVT.SABRA  didt his given right leg pain Continued on Crestor  5 mg daily. Neurology recommended Plavix  75 mg daily along with 81 mg aspirin . Lab Results  Component Value Date   CHOL 169 01/29/2024   HDL 67 01/29/2024   LDLCALC 87 01/29/2024   LDLDIRECT 190.1 07/15/2011   TRIG 74 01/29/2024   CHOLHDL 2.5 01/29/2024   She has hospital follow-up on March 3 with [facility] Follow-up appointment March 7 with cardiology   She is feeling back to baseline.  No current neuro changes,  no speech changes, no weakness.  BP Readings from Last 3 Encounters:  02/02/24 (!) 142/80  02/01/24 (!) 177/80  01/29/24 (!) 159/72    Relevant past medical, surgical, family and social history reviewed and updated as indicated. Interim medical  history since our last visit reviewed. Allergies and medications reviewed and updated. Outpatient Medications Prior to Visit  Medication Sig Dispense Refill   acetaminophen  (TYLENOL ) 500 MG tablet Take 1,000 mg by mouth every 6 (six) hours as needed for mild pain (pain score 1-3), fever or headache.     amLODipine  (NORVASC ) 5 MG tablet TAKE 1 TABLET BY MOUTH EVERY DAY 90 tablet 3   aspirin  EC 81 MG tablet Take 1 tablet (81 mg total) by mouth daily. Swallow whole. 30 tablet 12   Blood Glucose Monitoring Suppl (ONE TOUCH ULTRA MINI) w/Device KIT USE TO CHECK BLOOD SUGAR DAILY 1 kit 0   clopidogrel  (PLAVIX ) 75 MG tablet Take 1 tablet (75 mg total) by mouth daily. 21 tablet 0   glucose blood (ONETOUCH ULTRA) test strip USE TO CHECK BLOOD SUGAR DAILY 100 each 3   Lancets (ONETOUCH ULTRASOFT) lancets USE TO CHECK BLOOD SUGAR DAILY 100 each 3   Multiple Vitamin (MULTIVITAMIN WITH MINERALS) TABS tablet Take 1 tablet by mouth daily.     rosuvastatin  (CRESTOR ) 5 MG tablet TAKE 1 TABLET BY MOUTH EVERY OTHER DAY 45 tablet 3   Vitamin D , Cholecalciferol, 10 MCG (400 UNIT) CHEW Chew 1 tablet by mouth daily.     No facility-administered medications prior to visit.     Per HPI unless specifically indicated in ROS section below Review of Systems  Constitutional:  Negative for fatigue and fever.  HENT:  Negative for congestion.   Eyes:  Negative for pain.  Respiratory:  Negative for cough and shortness of breath.   Cardiovascular:  Negative for chest pain, palpitations and leg swelling.  Gastrointestinal:  Negative for abdominal pain.  Genitourinary:  Negative for dysuria and vaginal bleeding.  Musculoskeletal:  Negative for back pain.  Neurological:  Negative for syncope, light-headedness and headaches.  Psychiatric/Behavioral:  Negative for dysphoric mood.    Objective:  BP (!) 142/80 (BP Location: Left Arm, Patient Position: Sitting, Cuff Size: Normal)   Pulse (!) 58   Temp 97.9 F (36.6 C)  (Temporal)   Ht 5' 4.5 (1.638 m)   Wt 146 lb 6.4 oz (66.4 kg)   SpO2 99%   BMI 24.74 kg/m   Wt Readings from Last 3 Encounters:  02/02/24 146 lb 6.4 oz (66.4 kg)  01/28/24 145 lb (65.8 kg)  01/11/24 152 lb (68.9 kg)      Physical Exam Constitutional:      General: She is not in acute distress.    Appearance: Normal appearance. She is well-developed. She is not ill-appearing or toxic-appearing.  HENT:     Head: Normocephalic.     Right Ear: Hearing, tympanic membrane, ear canal and external ear normal. Tympanic membrane is not erythematous, retracted or bulging.     Left Ear: Hearing, tympanic membrane, ear canal and external ear normal. Tympanic membrane is not erythematous, retracted or bulging.     Nose: No mucosal edema or rhinorrhea.     Right Sinus: No maxillary sinus tenderness or frontal sinus tenderness.     Left Sinus: No maxillary sinus tenderness or frontal sinus tenderness.     Mouth/Throat:     Pharynx: Uvula midline.  Eyes:     General: Lids are normal. Lids are everted, no foreign bodies appreciated.     Conjunctiva/sclera: Conjunctivae normal.     Pupils: Pupils are equal, round, and reactive to light.  Neck:     Thyroid : No thyroid  mass or thyromegaly.     Vascular: No carotid bruit.     Trachea: Trachea normal.  Cardiovascular:     Rate and Rhythm: Normal rate and regular rhythm.     Pulses: Normal pulses.     Heart sounds: Normal heart sounds, S1 normal and S2 normal. No murmur heard.    No friction rub. No gallop.  Pulmonary:     Effort: Pulmonary effort is normal. No tachypnea or respiratory distress.     Breath sounds: Normal breath sounds. No decreased breath sounds, wheezing, rhonchi or rales.  Abdominal:     General: Bowel sounds are normal.     Palpations: Abdomen is soft.     Tenderness: There is no abdominal tenderness.  Musculoskeletal:     Cervical back: Normal range of motion and neck supple.  Skin:    General: Skin is warm and dry.      Findings: No rash.  Neurological:     Mental Status: She is alert and oriented to person, place, and time.     GCS: GCS eye subscore is 4. GCS verbal subscore is 5. GCS motor subscore is 6.     Cranial Nerves: No cranial nerve deficit.     Sensory: No sensory deficit.     Motor: No abnormal muscle tone.     Coordination: Coordination normal.     Gait: Gait normal.     Deep Tendon Reflexes: Reflexes are normal and symmetric.     Comments: Nml  cerebellar exam   No papilledema  Psychiatric:        Mood and Affect: Mood is not anxious or depressed.        Speech: Speech normal.        Behavior: Behavior normal. Behavior is cooperative.        Thought Content: Thought content normal.        Cognition and Memory: Memory is not impaired. She does not exhibit impaired recent memory or impaired remote memory.        Judgment: Judgment normal.       Results for orders placed or performed during the hospital encounter of 01/28/24  Protime-INR   Collection Time: 01/28/24 12:54 PM  Result Value Ref Range   Prothrombin Time 13.5 11.4 - 15.2 seconds   INR 1.0 0.8 - 1.2  APTT   Collection Time: 01/28/24 12:54 PM  Result Value Ref Range   aPTT 22 (L) 24 - 36 seconds  CBC   Collection Time: 01/28/24 12:54 PM  Result Value Ref Range   WBC 7.0 4.0 - 10.5 K/uL   RBC 4.74 3.87 - 5.11 MIL/uL   Hemoglobin 14.1 12.0 - 15.0 g/dL   HCT 56.2 63.9 - 53.9 %   MCV 92.2 80.0 - 100.0 fL   MCH 29.7 26.0 - 34.0 pg   MCHC 32.3 30.0 - 36.0 g/dL   RDW 87.9 88.4 - 84.4 %   Platelets 190 150 - 400 K/uL   nRBC 0.0 0.0 - 0.2 %  Differential   Collection Time: 01/28/24 12:54 PM  Result Value Ref Range   Neutrophils Relative % 74 %   Neutro Abs 5.1 1.7 - 7.7 K/uL   Lymphocytes Relative 19 %   Lymphs Abs 1.4 0.7 - 4.0 K/uL   Monocytes Relative 6 %   Monocytes Absolute 0.4 0.1 - 1.0 K/uL   Eosinophils Relative 1 %   Eosinophils Absolute 0.1 0.0 - 0.5 K/uL   Basophils Relative 0 %   Basophils Absolute 0.0  0.0 - 0.1 K/uL   Immature Granulocytes 0 %   Abs Immature Granulocytes 0.03 0.00 - 0.07 K/uL  Comprehensive metabolic panel   Collection Time: 01/28/24 12:54 PM  Result Value Ref Range   Sodium 138 135 - 145 mmol/L   Potassium 3.4 (L) 3.5 - 5.1 mmol/L   Chloride 105 98 - 111 mmol/L   CO2 24 22 - 32 mmol/L   Glucose, Bld 193 (H) 70 - 99 mg/dL   BUN 16 8 - 23 mg/dL   Creatinine, Ser 9.08 0.44 - 1.00 mg/dL   Calcium  8.9 8.9 - 10.3 mg/dL   Total Protein 6.4 (L) 6.5 - 8.1 g/dL   Albumin 3.7 3.5 - 5.0 g/dL   AST 19 15 - 41 U/L   ALT 14 0 - 44 U/L   Alkaline Phosphatase 95 38 - 126 U/L   Total Bilirubin 0.4 0.0 - 1.2 mg/dL   GFR, Estimated >39 >39 mL/min   Anion gap 9 5 - 15  Ethanol   Collection Time: 01/28/24 12:54 PM  Result Value Ref Range   Alcohol, Ethyl (B) <10 <10 mg/dL  Urine rapid drug screen (hosp performed)not at [facility]   Collection Time: 01/28/24 12:54 PM  Result Value Ref Range   Opiates NONE DETECTED NONE DETECTED   Cocaine NONE DETECTED NONE DETECTED   Benzodiazepines NONE DETECTED NONE DETECTED   Amphetamines NONE DETECTED NONE DETECTED   Tetrahydrocannabinol NONE DETECTED NONE DETECTED   Barbiturates NONE DETECTED  NONE DETECTED  Urinalysis, Routine w reflex microscopic -Urine, Clean Catch   Collection Time: 01/28/24 12:54 PM  Result Value Ref Range   Color, Urine YELLOW YELLOW   APPearance CLEAR CLEAR   Specific Gravity, Urine 1.015 1.005 - 1.030   pH 6.0 5.0 - 8.0   Glucose, UA 100 (A) NEGATIVE mg/dL   Hgb urine dipstick NEGATIVE NEGATIVE   Bilirubin Urine NEGATIVE NEGATIVE   Ketones, ur NEGATIVE NEGATIVE mg/dL   Protein, ur NEGATIVE NEGATIVE mg/dL   Nitrite NEGATIVE NEGATIVE   Leukocytes,Ua NEGATIVE NEGATIVE  Hemoglobin A1c   Collection Time: 01/28/24 12:54 PM  Result Value Ref Range   Hgb A1c MFr Bld 6.9 (H) 4.8 - 5.6 %   Mean Plasma Glucose 151.33 mg/dL  Magnesium   Collection Time: 01/28/24 12:54 PM  Result Value Ref Range   Magnesium 2.2 1.7 -  2.4 mg/dL  Phosphorus   Collection Time: 01/28/24 12:54 PM  Result Value Ref Range   Phosphorus 3.5 2.5 - 4.6 mg/dL  TSH   Collection Time: 01/28/24 12:54 PM  Result Value Ref Range   TSH 1.121 0.350 - 4.500 uIU/mL  CBG monitoring, ED   Collection Time: 01/28/24  9:50 PM  Result Value Ref Range   Glucose-Capillary 126 (H) 70 - 99 mg/dL  CBG monitoring, ED   Collection Time: 01/29/24  7:53 AM  Result Value Ref Range   Glucose-Capillary 128 (H) 70 - 99 mg/dL  Lipid panel   Collection Time: 01/29/24 12:35 PM  Result Value Ref Range   Cholesterol 169 0 - 200 mg/dL   Triglycerides 74 <849 mg/dL   HDL 67 >59 mg/dL   Total CHOL/HDL Ratio 2.5 RATIO   VLDL 15 0 - 40 mg/dL   LDL Cholesterol 87 0 - 99 mg/dL  CBG monitoring, ED   Collection Time: 01/29/24 12:37 PM  Result Value Ref Range   Glucose-Capillary 125 (H) 70 - 99 mg/dL  ECHOCARDIOGRAM COMPLETE   Collection Time: 01/29/24  2:22 PM  Result Value Ref Range   Weight 2,320 oz   Height 64 in   BP 163/73 mmHg   S' Lateral 2.20 cm   AR max vel 2.55 cm2   AV Area VTI 2.71 cm2   AV Mean grad 4.0 mmHg   AV Peak grad 6.9 mmHg   Ao pk vel 1.31 m/s   Area-P 1/2 2.62 cm2   AV Area mean vel 2.60 cm2   Est EF 60 - 65%   CBG monitoring, ED   Collection Time: 01/29/24  3:40 PM  Result Value Ref Range   Glucose-Capillary 178 (H) 70 - 99 mg/dL   Comment 1 Notify RN    Comment 2 Document in Chart     Assessment and Plan  TIA (transient ischemic attack) Assessment & Plan:  Subacute. Encouraged to continue risk factor modification.  Continue cholesterol control with Crestor  5 mg daily, plan Plavix  75 mg daily along with 81 mg aspirin  transitioning over to aspirin  alone in approximately 1 month. Appears she will be evaluated with cardiac monitor to make sure no evidence of arrhythmia contributing to TIA/MRI changes.  She has upcoming follow-up with neurology as well as cardiology.   White matter disease of brain due to  ischemia Assessment & Plan: Chronic changes of MRI showing progression since 2022.   Diastolic dysfunction without heart failure Assessment & Plan: Acute finding on echo.  Recommend blood pressure control.  Patient has no peripheral swelling.   Hypokalemia Assessment & Plan:  Due for reevaluation  Orders: -     Basic metabolic panel  Right leg numbness Assessment & Plan: Chronic, intermittent.  In hospital negative DVT. She reports intermittent symptoms of burning sharp pain and numbness in right lateral thigh.  She feels like it started after she had a spider bite/boil in right upper leg many months ago. Possible meralgia paresthetica. Does not keep her up at night, no treatment needed.   Type 2 diabetes mellitus with other circulatory complications (HTN) (HCC) Assessment & Plan: Chronic, refill prescription for lancets given last ones were not correct size. Slight worsening control of diabetes and encouraged her to follow blood sugar daily and to continue working on [employer].   Hyperlipidemia associated with type 2 diabetes mellitus (HCC) Assessment & Plan: Chronic, recent LDL not quite at goal.  Will likely plan increasing Crestor  dose but will allow her to discuss this with neurology and cardiology.  Work on low-cholesterol diet   Essential hypertension Assessment & Plan: Chronic, almost at goal in office today   Amlodipine  5 mg p.o. daily   Other orders -     OneTouch UltraSoft 2 Lancets; 1 Lancet by Does not apply route daily.  Dispense: 100 each; Refill: 0    No follow-ups on file.   Greig Ring, MD

## 2024-02-02 NOTE — Assessment & Plan Note (Signed)
 Acute finding on echo.  Recommend blood pressure control.  Patient has no peripheral swelling.

## 2024-02-02 NOTE — Assessment & Plan Note (Signed)
 Chronic changes of MRI showing progression since 2022.

## 2024-02-02 NOTE — Assessment & Plan Note (Signed)
 Chronic, recent LDL not quite at goal.  Will likely plan increasing Crestor  dose but will allow her to discuss this with neurology and cardiology.  Work on [employer]

## 2024-02-02 NOTE — Assessment & Plan Note (Signed)
 Chronic, refill prescription for lancets given last ones were not correct size. Slight worsening control of diabetes and encouraged her to follow blood sugar daily and to continue working on [employer].

## 2024-02-02 NOTE — Assessment & Plan Note (Signed)
 Due for reevaluation

## 2024-02-02 NOTE — Assessment & Plan Note (Addendum)
 Chronic, intermittent.  In hospital negative DVT. She reports intermittent symptoms of burning sharp pain and numbness in right lateral thigh.  She feels like it started after she had a spider bite/boil in right upper leg many months ago. Possible meralgia paresthetica. Does not keep her up at night, no treatment needed.

## 2024-02-02 NOTE — Assessment & Plan Note (Addendum)
 Subacute. Encouraged to continue risk factor modification.  Continue cholesterol control with Crestor  5 mg daily, plan Plavix  75 mg daily along with 81 mg aspirin  transitioning over to aspirin  alone in approximately 1 month. Appears she will be evaluated with cardiac monitor to make sure no evidence of arrhythmia contributing to TIA/MRI changes.  She has upcoming follow-up with neurology as well as cardiology.

## 2024-02-02 NOTE — Assessment & Plan Note (Signed)
 Chronic, almost at goal in office today   Amlodipine  5 mg p.o. daily

## 2024-02-04 DIAGNOSIS — I639 Cerebral infarction, unspecified: Secondary | ICD-10-CM

## 2024-02-08 ENCOUNTER — Ambulatory Visit (INDEPENDENT_AMBULATORY_CARE_PROVIDER_SITE_OTHER): Payer: Self-pay | Admitting: Physician Assistant

## 2024-02-08 VITALS — BP 171/78 | HR 59

## 2024-02-08 DIAGNOSIS — Q178 Other specified congenital malformations of ear: Secondary | ICD-10-CM

## 2024-02-08 NOTE — Progress Notes (Signed)
Patient is a very pleasant 73 year old female s/p right split earlobe repair performed 02/01/2024 by Dr. Ladona Ridgel who presents to clinic for postprocedural follow-up.  Reviewed procedural note and the excision site was closed with 5-0 Prolene.  Today, she is doing well from a postprocedural standpoint.  No issues with her split earlobe repair.  She feels prepared for suture removal.  On exam, skin edges appear approximated.  There is overlying scabbing throughout the excision site.  Surrounding skin/tissue appears healthy.  Good shape of repaired earlobe.  Simple interrupted Prolene sutures are all removed in their entirety without complication or difficulty.  There is still some scabbing at conclusion of visit.  Held saline moistened gauze to the earlobe to help soften the area and then applied Vaseline.  Recommending continued Vaseline.  She understands not to repierce the ear for at least 6 months.  BP was also markedly elevated here today, but not different from her consult appointment BP.  She denies any cardiac symptoms.  Recent TIA.  She saw her PCP yesterday for follow-up and is being managed.

## 2024-02-09 ENCOUNTER — Ambulatory Visit: Payer: BC Managed Care – PPO | Admitting: Dermatology

## 2024-02-23 NOTE — Progress Notes (Signed)
 [facility] [address] [place]. [place] [address] [phone]       HOSPITAL FOLLOW UP NOTE  Ms. LASHONNE SHULL Date of Birth:  November 27, 1951 Medical Record Number:  [id number]   Reason for Referral:  hospital stroke follow up    SUBJECTIVE:   CHIEF COMPLAINT:  Chief Complaint  Patient presents with   Hospitalization Follow-up    Rm2, husband present, Hospital followup:pt stated they have right leg tingling and burness. Plavix ran out.     HPI:   KANDI BRUSSEAU is a 73 y.o. who  has a past medical history of Hyperlipidemia and Hypertension.  Patient presented on 01/28/2024 with left sided weakness and slurred speech. Symptoms rapidly resolved. MRI showed small subacute infarcts in bifrontal subcortical white matter. LDL 87 on rosuvastatin 5mg . Increased to 10mg  daily. A1C 6.9. DAPT started for three weeks then asa alone. Personally reviewed hospitalization pertinent progress notes, lab work and imaging.  Evaluated by Dr Roda Shutters.   Since discharge, she reports continued improvement in left sided weakness. She does have mild numbness of right anterior thigh. Speech is back to baseline. She does note more cognitive fog since stroke. She has not been able to exercise and play brain games as she was previously due to difficulty with vision. She is followed by Dr Willey Blade, ophthalmology. Hx of retinal neovascularization. Previously treated with injection therapy.   BP not checked at home. CBGs around 100-110. She reports previously taking rosuvastatin 5mg  daily prior to stroke (written by PCP for every other day). Once returning home, she started every other day dosing per rx instructions. Dr Roda Shutters recommended increasing to 10mg  daily. She tolerated 5mg  daily without adverse effects. Cardiac monitor in place. Typically asymptomatic but husband reports an event, 2 days ago, where she became short of breath and reports hands were cold. Her hands were visibly shaking. No  syncopal event. She has an appt with cardiology this week.    PERTINENT IMAGING/LABS  CTA head & neck No emergent LVO, Severe bilateral intradural vertebral artery stenosis. Hypoplastic left A1 ACA, Mild bilateral intracranial ICA stenosis MRI Small subacute infarcts bilateral subcortical white matter, Chronic small vessel disease, progressed since 2022. Interval but chronic bilateral occipital and left cerebellar infarcts VAS [place] LE DVT no DVT 2D Echo: 60-65% Recommend 30-day cardiac event monitoring as outpatient   A1C Lab Results  Component Value Date   HGBA1C 6.9 (H) 01/28/2024    Lipid Panel     Component Value Date/Time   CHOL 169 01/29/2024 1235   TRIG 74 01/29/2024 1235   HDL 67 01/29/2024 1235   CHOLHDL 2.5 01/29/2024 1235   VLDL 15 01/29/2024 1235   LDLCALC 87 01/29/2024 1235   LDLDIRECT 190.1 07/15/2011 0922      ROS:   14 system review of systems performed and negative with exception of those listed in HPI  PMH:  Past Medical History:  Diagnosis Date   Hyperlipidemia    Hypertension     PSH:  Past Surgical History:  Procedure Laterality Date   APPENDECTOMY     ECTOPIC PREGNANCY SURGERY  1976   HAMMER TOE SURGERY Left 08/19/2017   hardware removed April 2019   TRIGGER FINGER RELEASE  2006   WRIST FRACTURE SURGERY     broken left wrist repair with plate    Social History:  Social History   Socioeconomic History   Marital status: Married    Spouse name: Not on file   Number of children:  Not on file   Years of education: Not on file   Highest education level: Bachelor's degree (e.g., BA, AB, BS)  Occupational History   Occupation: TEACHER    Comment: Kindergarten  Tobacco Use   Smoking status: Never   Smokeless tobacco: Never  Vaping Use   Vaping status: Never Used  Substance and Sexual Activity   Alcohol use: No   Drug use: No   Sexual activity: Not Currently  Other Topics Concern   Not on file  Social History Narrative   Not on  file   Social Drivers of Health   Financial Resource Strain: Low Risk  (02/01/2024)   Overall Financial Resource Strain (CARDIA)    Difficulty of Paying Living Expenses: Not hard at all  Food Insecurity: No Food Insecurity (02/01/2024)   Hunger Vital Sign    Worried About Running Out of Food in the Last Year: Never true    Ran Out of Food in the Last Year: Never true  Transportation Needs: No Transportation Needs (02/01/2024)   PRAPARE - Administrator, Civil Service (Medical): No    Lack of Transportation (Non-Medical): No  Physical Activity: Insufficiently Active (02/01/2024)   Exercise Vital Sign    Days of Exercise per Week: 1 day    Minutes of Exercise per Session: 10 min  Stress: No Stress Concern Present (02/01/2024)   [employer] of Occupational Health - Occupational Stress Questionnaire    Feeling of Stress : Only a little  Social Connections: Socially Integrated (02/01/2024)   Social Connection and Isolation Panel [NHANES]    Frequency of Communication with Friends and Family: More than three times a week    Frequency of Social Gatherings with Friends and Family: Twice a week    Attends Religious Services: More than 4 times per year    Active Member of [employer] or Organizations: Yes    Attends Engineer, structural: More than 4 times per year    Marital Status: Married  Catering manager Violence: Not At Risk (07/27/2023)   Humiliation, Afraid, Rape, and Kick questionnaire    Fear of Current or Ex-Partner: No    Emotionally Abused: No    Physically Abused: No    Sexually Abused: No    Family History:  Family History  Problem Relation Age of Onset   Diabetes Mother    Heart disease Mother        cad and chf   Heart disease Father        heart attack (massive)   Cancer Maternal Grandmother        COLON   Stroke Maternal Grandfather    Diabetes Brother    Heart disease Brother 61       hert attack   Heart disease Brother 50       CABG    Stroke  Sister    Heart disease Sister        VALVE REPLACEMENT   Breast cancer Sister    Breast cancer Cousin 50    Medications:   Current Outpatient Medications on File Prior to Visit  Medication Sig Dispense Refill   acetaminophen (TYLENOL) 500 MG tablet Take 1,000 mg by mouth every 6 (six) hours as needed for mild pain (pain score 1-3), fever or headache.     amLODipine (NORVASC) 5 MG tablet TAKE 1 TABLET BY MOUTH EVERY DAY 90 tablet 3   aspirin EC 81 MG tablet Take 1 tablet (81 mg total) by mouth daily.  Swallow whole. 30 tablet 12   Blood Glucose Monitoring Suppl (ONE TOUCH ULTRA MINI) w/Device KIT USE TO CHECK BLOOD SUGAR DAILY 1 kit 0   glucose blood (ONETOUCH ULTRA) test strip USE TO CHECK BLOOD SUGAR DAILY 100 each 3   Lancets (ONETOUCH ULTRASOFT) lancets USE TO CHECK BLOOD SUGAR DAILY 100 each 3   Multiple Vitamin (MULTIVITAMIN WITH MINERALS) TABS tablet Take 1 tablet by mouth daily.     OneTouch UltraSoft 2 Lancets MISC 1 Lancet by Does not apply route daily. 100 each 0   rosuvastatin (CRESTOR) 5 MG tablet TAKE 1 TABLET BY MOUTH EVERY OTHER DAY 45 tablet 3   Vitamin D, Cholecalciferol, 10 MCG (400 UNIT) CHEW Chew 1 tablet by mouth daily.     No current facility-administered medications on file prior to visit.    Allergies:   Allergies  Allergen Reactions   Sulfa Antibiotics Hives and Other (See Comments)    Blisters   Sulfonamide Derivatives Hives and Other (See Comments)    REACTION: Blisters on leg   Amoxicillin Rash   Penicillins Rash    Other Reaction(s): Not available      OBJECTIVE:  Physical Exam  Vitals:   02/27/24 0812 02/27/24 0817  BP: (!) 173/78 (!) 171/79  Pulse: (!) 55 (!) 55  Weight: 147 lb (66.7 kg)   Height: 5' 4.5" (1.638 m)    Body mass index is 24.84 kg/m. No results found.     02/02/2024    8:17 AM  Depression screen PHQ 2/9  Decreased Interest 0  Down, Depressed, Hopeless 0  PHQ - 2 Score 0  Altered sleeping 1  Tired, decreased  energy 0  Change in appetite 1  Feeling bad or failure about yourself  0  Trouble concentrating 0  Moving slowly or fidgety/restless 0  Suicidal thoughts 0  PHQ-9 Score 2  Difficult doing work/chores Not difficult at all     General: well developed, well nourished, seated, in no evident distress Head: head normocephalic and atraumatic.   Neck: supple with no carotid or supraclavicular bruits Cardiovascular: regular rate and rhythm, no murmurs Musculoskeletal: no deformity Skin:  no rash/petichiae Vascular:  Normal pulses all extremities   Neurologic Exam Mental Status: Awake and fully alert.  Fluent speech and language.  Oriented to place and time. Recent and remote memory intact. Attention span, concentration and fund of knowledge appropriate. Mood and affect appropriate.  Cranial Nerves: Fundoscopic exam reveals sharp disc margins. Pupils equal, briskly reactive to light. Extraocular movements full without nystagmus. Visual fields full to confrontation. Hearing intact. Facial sensation intact. Face, tongue, palate moves normally and symmetrically.  Motor: Normal bulk and tone. Normal strength in all tested extremity muscles Sensory.: intact to touch , pinprick , position and vibratory sensation.  Coordination: Rapid alternating movements normal in all extremities. Finger-to-nose and heel-to-shin performed accurately bilaterally. Gait and Station: Arises from chair without difficulty. Stance is normal. Gait demonstrates normal stride length and balance with no assistive device.  Reflexes: 1+ and symmetric.    NIHSS  1 Modified Rankin  0    ASSESSMENT: ANALISA SLEDD is a 73 y.o. year old female presenting 01/28/2024 with left sided weakness and slurred speech. Vascular risk factors include HYN, HLD, DMT2, advanced age, family history CVA.     PLAN:  Acute/subacute bilateral frontal small MCA/ACA watershed infarcts, etiology: ?hypotension with hypoplastic ACA on imaging vs.  Small vessel disease, can not completely rule out cardioembolic source: Residual deficit: right thigh numbness,  cognitive fog. Continue aspirin 81 mg daily and rosuvastatin 10mg  daily for secondary stroke prevention.  Discussed secondary stroke prevention measures and importance of close PCP follow up for aggressive stroke risk factor management. I have gone over the pathophysiology of stroke, warning signs and symptoms, risk factors and their management in some detail with instructions to go to the closest emergency room for symptoms of concern. HTN: BP goal <130/90.  Stable. Continue amlodipine 5mg  daily per PCP. Continue to monitor BP closely as syncopal events could be caused by vertebrobasilar insuffiencey.  HLD: LDL goal <70. Recent LDL 87. Continue rosuvastatin 10mg  daily per PCP. DMII: A1c goal<7.0. Recent A1c 6.9. At goal. Not on diabetic agents. Continue to monitor closely per PCP.  Vertebral artery stenosis: Continue rosuvastatin. Discuss dose with PCP. We recommend increasing to 10mg  daily as you were previously taking 5mg  every day.  Retinal neovascularization: Continue close follow up with ophthalmology.  Cognitive fog: resume regular physical and mental exercise as discussed.    Follow up as needed   CC:  [facility] provider: Dr. Pearlean Brownie PCP: Excell Seltzer, MD    I spent 45 minutes of face-to-face and non-face-to-face time with patient.  This included previsit chart review including review of recent hospitalization, lab review, study review, order entry, electronic health record documentation, patient education regarding recent stroke including etiology, secondary stroke prevention measures and importance of managing stroke risk factors, residual deficits and typical recovery time and answered all other questions to patient satisfaction   Shawnie Dapper, [facility]  [facility] [address] [place], [place] [address]  Phone [phone] Fax [phone] Note:  This document was prepared with digital dictation and possible smart phrase technology. Any transcriptional errors that result from this process are unintentional.

## 2024-02-23 NOTE — Patient Instructions (Signed)
 Below is our plan:  Acute/subacute bilateral frontal small MCA/ACA watershed infarcts, etiology: ?hypotension with hypoplastic ACA on imaging vs. Small vessel disease, can not completely rule out cardioembolic source: Residual deficit: none. Continue aspirin 81 mg daily and rosuvastatin 10mg  daily for secondary stroke prevention.  Discussed secondary stroke prevention measures and importance of close PCP follow up for aggressive stroke risk factor management. I have gone over the pathophysiology of stroke, warning signs and symptoms, risk factors and their management in some detail with instructions to go to the closest emergency room for symptoms of concern. HTN: BP goal <130/90.  Stable. Continue amlodipine 5mg  daily per PCP. Continue to monitor BP closely as syncopal events could be caused by vertebrobasilar insuffiencey.  HLD: LDL goal <70. Recent LDL 87. Continue rosuvastatin 10mg  daily per PCP DMII: A1c goal<7.0. Recent A1c 6.9. At goal. Not on diabetic agents. Continue to monitor closely per PCP.  Vertebral artery stenosis: Continue rosuvastatin. Discuss dose with PCP. We recommend increasing to 10mg  daily as you were previously taking 5mg  every day.   Goals:  1) Maintain strict control of hypertension with blood pressure goal below 130/90 2) Maintain good control of diabetes with hemoglobin A1c goal below 7%  3) Maintain good control of lipids with LDL cholesterol goal below 70 mg/dL.  4) Eat a healthy diet with plenty of whole grains, cereals, fruits and vegetables, exercise regularly and maintain ideal body weight   Resources: https://www.williams.biz/  Please make sure you are staying well hydrated. I recommend 50-60 ounces daily. Well balanced diet and regular exercise encouraged. Consistent sleep schedule with 6-8 hours recommended.   Please continue follow up with care team as directed.   Follow up with me as needed    You may receive a survey regarding today's visit. I encourage you to leave honest feed back as I do use this information to improve patient care. Thank you for seeing me today!

## 2024-02-27 ENCOUNTER — Encounter: Payer: Self-pay | Admitting: Family Medicine

## 2024-02-27 ENCOUNTER — Ambulatory Visit (INDEPENDENT_AMBULATORY_CARE_PROVIDER_SITE_OTHER): Payer: Medicare Other | Admitting: Family Medicine

## 2024-02-27 VITALS — BP 171/79 | HR 55 | Ht 64.5 in | Wt 147.0 lb

## 2024-02-27 DIAGNOSIS — I639 Cerebral infarction, unspecified: Secondary | ICD-10-CM

## 2024-02-28 ENCOUNTER — Other Ambulatory Visit: Payer: Self-pay | Admitting: Family Medicine

## 2024-02-28 NOTE — Telephone Encounter (Signed)
Okay to change to Crestor 10 mg daily.

## 2024-02-28 NOTE — Telephone Encounter (Signed)
 Copied from CRM [id number]. Topic: Clinical - Medication Question >> Feb 28, 2024 10:53 AM Isabell A wrote: Reason for CRM: Patient was seen by Dr.Lomax for a hospital follow up, states she was told rosuvastatin (CRESTOR) 5 MG tablet needed to be increased to 10mg .

## 2024-02-28 NOTE — Addendum Note (Signed)
 Addended by: Damita Lack on: 02/28/2024 12:20 PM   Modules accepted: Orders

## 2024-02-29 ENCOUNTER — Telehealth: Payer: Self-pay | Admitting: Family Medicine

## 2024-02-29 MED ORDER — ROSUVASTATIN CALCIUM 10 MG PO TABS
10.0000 mg | ORAL_TABLET | Freq: Every day | ORAL | 3 refills | Status: DC
Start: 2024-02-29 — End: 2024-03-02

## 2024-02-29 NOTE — Telephone Encounter (Signed)
 Copied from CRM [id number]. Topic: General - Other >> Feb 29, 2024 10:40 AM Truddie Crumble wrote: Reason for CRM: patient called checking on medication increase for crestor

## 2024-02-29 NOTE — Telephone Encounter (Signed)
 Left message for [name] that Dr. Ermalene Searing has sent in the prescription for Crestor 10 mg daily to her pharmacy.

## 2024-02-29 NOTE — Telephone Encounter (Signed)
 Medication at increased dose refilled today.

## 2024-03-02 ENCOUNTER — Ambulatory Visit: Payer: Medicare Other | Attending: Internal Medicine | Admitting: Internal Medicine

## 2024-03-02 VITALS — BP 132/68 | HR 56 | Ht 64.0 in | Wt 146.0 lb

## 2024-03-02 DIAGNOSIS — R7309 Other abnormal glucose: Secondary | ICD-10-CM

## 2024-03-02 DIAGNOSIS — E785 Hyperlipidemia, unspecified: Secondary | ICD-10-CM | POA: Diagnosis not present

## 2024-03-02 DIAGNOSIS — I639 Cerebral infarction, unspecified: Secondary | ICD-10-CM | POA: Diagnosis not present

## 2024-03-02 MED ORDER — ROSUVASTATIN CALCIUM 20 MG PO TABS: 20.0000 mg | ORAL_TABLET | Freq: Every day | ORAL | 3 refills | Status: AC

## 2024-03-02 NOTE — Progress Notes (Signed)
 OFFICE NOTE  Chief Complaint:  Establish cardiologist  Primary Care Physician: Meghan Seltzer, MD  HPI:  Meghan Kerr is a 73 y.o. female with a past medial history significant for hypertension and dyslipidemia with borderline diabetes diagnosis.  She has had A1c's as high as 7.1 in the past but is controlled primarily with diet.  She is now following home fasting blood sugars which she reports are generally between 100-120.  She does have a strong family history of diabetes with multiple family members.  Fortunately she recently had stroke.  This showed watershed territory distribution concerning for possible embolic phenomenon.  Echo was performed which showed normal LVEF.  Neurology had reached out to [place] for a 30-day monitor.  Unfortunately she is still wearing that monitor and has not returned it therefore I cannot comment on the data from it.  She was started on aspirin and Plavix but finished 3 weeks of Plavix and now is on aspirin monotherapy.  She was on Crestor 5 mg daily with an LDL of 87.  Due to possible diabetes and stroke I would target her LDL to less than 55.  It was advised that she increase it to 10 mg daily but this did not happen until very recently when she just saw the neurologist.  Actually, by guidelines she should be on high intensity statin therapy with acute stroke which would be Crestor 20 mg daily.  She denies any cardiac symptoms such as chest pain or shortness of breath.  PMHx:  Past Medical History:  Diagnosis Date   Hyperlipidemia    Hypertension     Past Surgical History:  Procedure Laterality Date   APPENDECTOMY     ECTOPIC PREGNANCY SURGERY  1976   HAMMER TOE SURGERY Left 08/19/2017   hardware removed April 2019   TRIGGER FINGER RELEASE  2006   WRIST FRACTURE SURGERY     broken left wrist repair with plate    FAMHx:  Family History  Problem Relation Age of Onset   Diabetes Mother    Heart disease Mother        cad and chf   Heart disease  Father        heart attack (massive)   Cancer Maternal Grandmother        COLON   Stroke Maternal Grandfather    Diabetes Brother    Heart disease Brother 66       hert attack   Heart disease Brother 71       CABG    Stroke Sister    Heart disease Sister        VALVE REPLACEMENT   Breast cancer Sister    Breast cancer Cousin 27    SOCHx:   reports that she has never smoked. She has never used smokeless tobacco. She reports that she does not drink alcohol and does not use drugs.  ALLERGIES:  Allergies  Allergen Reactions   Sulfa Antibiotics Hives and Other (See Comments)    Blisters   Sulfonamide Derivatives Hives and Other (See Comments)    REACTION: Blisters on leg   Amoxicillin Rash   Penicillins Rash    Other Reaction(s): Not available    ROS: Pertinent items noted in HPI and remainder of comprehensive ROS otherwise negative.  HOME MEDS: Current Outpatient Medications on File Prior to Visit  Medication Sig Dispense Refill   acetaminophen (TYLENOL) 500 MG tablet Take 1,000 mg by mouth every 6 (six) hours as needed for mild pain (  pain score 1-3), fever or headache.     amLODipine (NORVASC) 5 MG tablet TAKE 1 TABLET BY MOUTH EVERY DAY 90 tablet 3   aspirin EC 81 MG tablet Take 1 tablet (81 mg total) by mouth daily. Swallow whole. 30 tablet 12   Blood Glucose Monitoring Suppl (ONE TOUCH ULTRA MINI) w/Device KIT USE TO CHECK BLOOD SUGAR DAILY 1 kit 0   glucose blood (ONETOUCH ULTRA) test strip USE TO CHECK BLOOD SUGAR DAILY 100 each 3   Multiple Vitamin (MULTIVITAMIN WITH MINERALS) TABS tablet Take 1 tablet by mouth daily.     Vitamin D, Cholecalciferol, 10 MCG (400 UNIT) CHEW Chew 1 tablet by mouth daily.     Lancets (ONETOUCH ULTRASOFT) lancets USE TO CHECK BLOOD SUGAR DAILY 100 each 3   OneTouch UltraSoft 2 Lancets MISC 1 Lancet by Does not apply route daily. 100 each 0   No current facility-administered medications on file prior to visit.    LABS/IMAGING: No  results found for this or any previous visit (from the past 48 hours). No results found.  LIPID PANEL:    Component Value Date/Time   CHOL 169 01/29/2024 1235   TRIG 74 01/29/2024 1235   HDL 67 01/29/2024 1235   CHOLHDL 2.5 01/29/2024 1235   VLDL 15 01/29/2024 1235   LDLCALC 87 01/29/2024 1235   LDLDIRECT 190.1 07/15/2011 0922     WEIGHTS: Wt Readings from Last 3 Encounters:  03/02/24 146 lb (66.2 kg)  02/27/24 147 lb (66.7 kg)  02/02/24 146 lb 6.4 oz (66.4 kg)    VITALS: BP 132/68 (BP Location: Left Arm, Patient Position: Sitting, Cuff Size: Normal)   Pulse (!) 56   Ht 5\' 4"  (1.626 m)   Wt 146 lb (66.2 kg)   SpO2 99%   BMI 25.06 kg/m   EXAM: General appearance: alert and no distress Neck: no carotid bruit, no JVD, and thyroid not enlarged, symmetric, no tenderness/mass/nodules Lungs: clear to auscultation bilaterally Heart: regular rate and rhythm, S1, S2 normal, no murmur, click, rub or gallop Abdomen: soft, non-tender; bowel sounds normal; no masses,  no organomegaly Extremities: extremities normal, atraumatic, no cyanosis or edema Pulses: 2+ and symmetric Skin: Skin color, texture, turgor normal. No rashes or lesions Neurologic: Grossly normal Psych: Pleasant  EKG: Deferred  ASSESSMENT: Acute bilateral frontal small MCA/ACA watershed infarcts Borderline diabetes-not on medication, A1c 6.9% Dyslipidemia, goal LDL less than 55 (if considered diabetic, high risk given multiple comorbidities)  PLAN: 1.   Meghan Kerr had recent stroke and has a history of A1c in the diabetic range but not on medication.  She is monitoring home blood sugars.  Based on this and the 2022 guidelines would target her more aggressively to lower LDL less than 55.  Age is not a factor in these guidelines.  Based also on guidelines she should be on high intensity statin therapy therefore we will increase her rosuvastatin to 20 mg daily.  Plan check repeat lipids in 4 months including LPA  and NMR and if she has an elevated LPA and is not at target would consider additional therapies.  I would defer to her PCP regarding reassessment of her A1c, however strongly consider some therapy for this.  Will reach out to her with results of her monitor once it has been processed.  Will then schedule follow-up in about 4 to 6 months with our APP.  Chrystie Nose, MD, [facility], FACP  [facility]  Medical Director of the  Advanced Lipid Disorders &  Cardiovascular Risk Reduction Clinic Diplomate of the American Board of Clinical Lipidology Attending Cardiologist  Direct Dial: [phone]  Fax: [phone]  Website:  [email or web address] 03/02/2024, 9:49 AM

## 2024-03-02 NOTE — Patient Instructions (Signed)
 Medication Instructions:  INCREASE rosuvastatin to 20mg  daily You can take TWO of the 10mg  tablets until you run out   *If you need a refill on your cardiac medications before your next appointment, please call your pharmacy*   Lab Work: FASTING lab work in 4 months -- NMR lipoprofile and LPa  If you have labs (blood work) drawn today and your tests are completely normal, you will receive your results only by: MyChart Message (if you have MyChart) OR A paper copy in the mail If you have any lab test that is abnormal or we need to change your treatment, we will call you to review the results.   Testing/Procedures: We will be in touch about the monitor results.   Follow-Up: At [facility], you and your health needs are our priority.  As part of our continuing mission to provide you with exceptional heart care, we have created designated Provider Care Teams.  These Care Teams include your primary Cardiologist (physician) and Advanced Practice Providers (APPs -  Physician Assistants and Nurse Practitioners) who all work together to provide you with the care you need, when you need it.  We recommend signing up for the patient portal called "MyChart".  Sign up information is provided on this After Visit Summary.  MyChart is used to connect with patients for Virtual Visits (Telemedicine).  Patients are able to view lab/test results, encounter notes, upcoming appointments, etc.  Non-urgent messages can be sent to your provider as well.   To learn more about what you can do with MyChart, go to [email or web address].    Your next appointment:   4-6 months with PA or NP  ** call in April or May for an appointment OR message thru MyChart  Other Instructions   1st Floor: - Lobby - Registration  - Pharmacy  - Lab - Cafe  2nd Floor: - PV Lab - Diagnostic Testing (echo, CT, nuclear med)  3rd Floor: - Vacant  4th Floor: - TCTS (cardiothoracic surgery) - AFib Clinic -  Structural Heart Clinic - Vascular Surgery  - Vascular Ultrasound  5th Floor: - HeartCare Cardiology (general and EP) - Clinical Pharmacy for coumadin, hypertension, lipid, weight-loss medications, and med management appointments    Valet parking services will be available as well.

## 2024-03-05 ENCOUNTER — Ambulatory Visit: Attending: Internal Medicine

## 2024-03-05 DIAGNOSIS — I639 Cerebral infarction, unspecified: Secondary | ICD-10-CM

## 2024-03-07 ENCOUNTER — Telehealth: Payer: Self-pay

## 2024-03-07 NOTE — Telephone Encounter (Signed)
 It is fine for her to start a stretching class Friday

## 2024-03-07 NOTE — Telephone Encounter (Signed)
Meghan Kerr notified as instructed by telephone.  Patient states understanding.

## 2024-03-07 NOTE — Telephone Encounter (Signed)
 Copied from CRM [id number]. Topic: Clinical - Medical Advice >> Mar 07, 2024 12:11 PM [place] J wrote: Reason for CRM: It's been 8-9 weeks since stroke happened. Patient signed up for a class for stretching (no impact or heavy lifting). Pt wants to know if Dr. Ermalene Searing thinks it's okay for her to do that. Her first class starts Friday. Patient would like a phone call for confirmation.

## 2024-03-21 ENCOUNTER — Telehealth: Payer: Self-pay | Admitting: Internal Medicine

## 2024-03-21 ENCOUNTER — Telehealth: Payer: Self-pay | Admitting: Family Medicine

## 2024-03-21 NOTE — Telephone Encounter (Signed)
 Pt c/o medication issue:  1. Name of Medication: rosuvastatin (CRESTOR) 20 MG tablet   2. How are you currently taking this medication (dosage and times per day)? 20 mg, Daily   3. Are you having a reaction (difficulty breathing--STAT)? No  4. What is your medication issue? Per pt, Feeling jittery/nervouse since increase in med, agitated more and change in sleep pattern. Requesting cb

## 2024-03-21 NOTE — Telephone Encounter (Signed)
 Meghan Kerr, [facility]  YouJust now (2:22 PM)    Unlikely these s/e are from Crestor   Patient identification verified by 2 forms. Marilynn Rail, RN    Called and spoke to patient  Patient states:   -Crestor increased to 20mg  daily   -since starting has been waking up at 2:30am, feels jittery   -husband noticed she is more edgy   -she is no longer waking up at 2 am   -she does still feel restless  Relayed message from pharmacy  Advised patient to follow up with PCP  Patient verbalized understanding, no questions at this time

## 2024-03-21 NOTE — Telephone Encounter (Signed)
 Pt said having a side effect from rosuvastatin (CRESTOR) 20 MG tablet. Feeling edgy and wake up cannot go back to sleep.  Informed patient medication was prescribed by another physician.

## 2024-03-21 NOTE — Telephone Encounter (Signed)
 Called to discuss the below and informed her that cardiology prescribed rosuvastatin Merceda Elks, MD. Pt report she doesn't feel good on medication. I advised pt to call Dr.Hility office and speak with his nurse regarding her concerns.   Pt verbalized she understood and would call, number given to patient for Dr.Hilty office to call.

## 2024-05-15 ENCOUNTER — Ambulatory Visit (INDEPENDENT_AMBULATORY_CARE_PROVIDER_SITE_OTHER): Payer: BC Managed Care – PPO | Admitting: Dermatology

## 2024-05-15 ENCOUNTER — Encounter: Payer: Self-pay | Admitting: Dermatology

## 2024-05-15 ENCOUNTER — Encounter (INDEPENDENT_AMBULATORY_CARE_PROVIDER_SITE_OTHER): Payer: Self-pay

## 2024-05-15 DIAGNOSIS — D229 Melanocytic nevi, unspecified: Secondary | ICD-10-CM

## 2024-05-15 DIAGNOSIS — Z1283 Encounter for screening for malignant neoplasm of skin: Secondary | ICD-10-CM

## 2024-05-15 DIAGNOSIS — W908XXA Exposure to other nonionizing radiation, initial encounter: Secondary | ICD-10-CM | POA: Diagnosis not present

## 2024-05-15 DIAGNOSIS — L578 Other skin changes due to chronic exposure to nonionizing radiation: Secondary | ICD-10-CM

## 2024-05-15 DIAGNOSIS — L821 Other seborrheic keratosis: Secondary | ICD-10-CM

## 2024-05-15 DIAGNOSIS — D692 Other nonthrombocytopenic purpura: Secondary | ICD-10-CM

## 2024-05-15 DIAGNOSIS — L255 Unspecified contact dermatitis due to plants, except food: Secondary | ICD-10-CM | POA: Diagnosis not present

## 2024-05-15 DIAGNOSIS — L814 Other melanin hyperpigmentation: Secondary | ICD-10-CM

## 2024-05-15 DIAGNOSIS — D1801 Hemangioma of skin and subcutaneous tissue: Secondary | ICD-10-CM

## 2024-05-15 MED ORDER — TRIAMCINOLONE ACETONIDE 0.1 % EX CREA: TOPICAL_CREAM | CUTANEOUS | 2 refills | Status: DC

## 2024-05-15 MED ORDER — PREDNISONE 5 MG PO TABS: ORAL_TABLET | ORAL | 0 refills | Status: DC

## 2024-05-15 NOTE — Progress Notes (Signed)
 Follow-Up Visit   Subjective  Meghan Kerr is a 73 y.o. female who presents for the following: Skin Cancer Screening and Full Body Skin Exam. No personal hx of skin cancer or dysplastic nevi. Pt has itchy rash all over, got after working out in yard over a week ago.  Getting new spots. Thinks she has bug bites. Itchy.  The patient presents for Total-Body Skin Exam (TBSE) for skin cancer screening and mole check. The patient has spots, moles and lesions to be evaluated, some may be new or changing and the patient may have concern these could be cancer.    The following portions of the chart were reviewed this encounter and updated as appropriate: medications, allergies, medical history  Review of Systems:  No other skin or systemic complaints except as noted in HPI or Assessment and Plan.  Objective  Well appearing patient in no apparent distress; mood and affect are within normal limits.  A full examination was performed including scalp, head, eyes, ears, nose, lips, neck, chest, axillae, abdomen, back, buttocks, bilateral upper extremities, bilateral lower extremities, hands, feet, fingers, toes, fingernails, and toenails. All findings within normal limits unless otherwise noted below.   Relevant physical exam findings are noted in the Assessment and Plan.  Knees, arms, back, chest Linear pink excoriated papules  Assessment & Plan   SKIN CANCER SCREENING PERFORMED TODAY.  ACTINIC DAMAGE - Chronic condition, secondary to cumulative UV/sun exposure - diffuse scaly erythematous macules with underlying dyspigmentation - Recommend daily broad spectrum sunscreen SPF 30+ to sun-exposed areas, reapply every 2 hours as needed.  - Staying in the shade or wearing long sleeves, sun glasses (UVA+UVB protection) and wide brim hats (4-inch brim around the entire circumference of the hat) are also recommended for sun protection.  - Call for new or changing lesions.  LENTIGINES, SEBORRHEIC  KERATOSES, HEMANGIOMAS - Benign normal skin lesions - Benign-appearing - Call for any changes  MELANOCYTIC NEVI - Tan-brown and/or pink-flesh-colored symmetric macules and papules - Benign appearing on exam today - Observation - Call clinic for new or changing moles - Recommend daily use of broad spectrum spf 30+ sunscreen to sun-exposed areas.    Purpura - Chronic; persistent and recurrent.  Treatable, but not curable. - Violaceous macules and patches - Benign - Related to trauma, age, sun damage and/or use of blood thinners, chronic use of topical and/or oral steroids - Observe - Can use OTC arnica containing moisturizer such as Dermend Bruise Formula if desired - Call for worsening or other concerns    DERMATITIS DUE TO PLANTS, INCLUDING POISON IVY, SUMAC, AND OAK Knees, arms, back, chest Start Triamcinolone  0.1% cream twice a day up to 2 weeks as needed to rash on arms, legs, torso.  Avoid applying to face, groin, and axilla. Use as directed. Long-term use can cause thinning of the skin.  Topical steroids (such as triamcinolone , fluocinolone , fluocinonide, mometasone, clobetasol , halobetasol, betamethasone, hydrocortisone) can cause thinning and lightening of the skin if they are used for too long in the same area. Your physician has selected the right strength medicine for your problem and area affected on the body. Please use your medication only as directed by your physician to prevent side effects.    Discussed oral prednisone  taper. Will start 3 wk taper Take prednisone  by mouth daily in morning with food starting at 60 mg dosage and gradually decreasing daily dose as directed until gone for a three week taper.  Patient was given written instructions.  Potential side effects reviewed.   Risks of prednisone  taper include mood irritability, insomnia, weight gain, stomach ulcers, increased risk of infection, increased blood sugar (diabetes), hypertension, osteoporosis with  long-term or frequent use, and rare risk of avascular necrosis of the hip.    Return in about 1 year (around 05/15/2025) for TBSE.  I, Darcie Easterly, CMA, am acting as scribe for Artemio Larry, MD.   Documentation: I have reviewed the above documentation for accuracy and completeness, and I agree with the above.  Artemio Larry, MD

## 2024-05-15 NOTE — Progress Notes (Deleted)
   Follow-Up Visit   Subjective  Meghan Kerr is a 73 y.o. female who presents for the following: Skin Cancer Screening and Full Body Skin Exam. No personal Hx of skin cancer or dysplastic nevi.   The patient presents for Total-Body Skin Exam (TBSE) for skin cancer screening and mole check. The patient has spots, moles and lesions to be evaluated, some may be new or changing and the patient may have concern these could be cancer.    The following portions of the chart were reviewed this encounter and updated as appropriate: medications, allergies, medical history  Review of Systems:  No other skin or systemic complaints except as noted in HPI or Assessment and Plan.  Objective  Well appearing patient in no apparent distress; mood and affect are within normal limits.  A full examination was performed including scalp, head, eyes, ears, nose, lips, neck, chest, axillae, abdomen, back, buttocks, bilateral upper extremities, bilateral lower extremities, hands, feet, fingers, toes, fingernails, and toenails. All findings within normal limits unless otherwise noted below.   Relevant physical exam findings are noted in the Assessment and Plan.    Assessment & Plan   SKIN CANCER SCREENING PERFORMED TODAY.  ACTINIC DAMAGE - Chronic condition, secondary to cumulative UV/sun exposure - diffuse scaly erythematous macules with underlying dyspigmentation - Recommend daily broad spectrum sunscreen SPF 30+ to sun-exposed areas, reapply every 2 hours as needed.  - Staying in the shade or wearing long sleeves, sun glasses (UVA+UVB protection) and wide brim hats (4-inch brim around the entire circumference of the hat) are also recommended for sun protection.  - Call for new or changing lesions.  LENTIGINES, SEBORRHEIC KERATOSES, HEMANGIOMAS - Benign normal skin lesions - Benign-appearing - Call for any changes  MELANOCYTIC NEVI - Tan-brown and/or pink-flesh-colored symmetric macules and  papules - Benign appearing on exam today - Observation - Call clinic for new or changing moles - Recommend daily use of broad spectrum spf 30+ sunscreen to sun-exposed areas.        Return in about 1 year (around 05/15/2025) for TBSE.  I, Darcie Easterly, CMA, am acting as scribe for Artemio Larry, MD.   Documentation: I have reviewed the above documentation for accuracy and completeness, and I agree with the above.  Artemio Larry, MD

## 2024-05-15 NOTE — Patient Instructions (Addendum)
 Purpura/Bruising on arms: - Related to trauma, age, sun damage and/or use of blood thinners, chronic use of topical and/or oral steroids - Can use OTC arnica containing moisturizer such as Dermend Bruise Formula if desired   Poison Ivy:  Start Triamcinolone  0.1% cream twice a day up to 2 weeks as needed to rash on arms, legs, torso.  Avoid applying to face, groin, and axilla. Use as directed. Long-term use can cause thinning of the skin.  Topical steroids (such as triamcinolone , fluocinolone , fluocinonide, mometasone, clobetasol , halobetasol, betamethasone, hydrocortisone) can cause thinning and lightening of the skin if they are used for too long in the same area. Your physician has selected the right strength medicine for your problem and area affected on the body. Please use your medication only as directed by your physician to prevent side effects.   Do not take aspirin  while taking prednisone  Risks of prednisone  taper include mood irritability, insomnia, weight gain, stomach ulcers, increased risk of infection, increased blood sugar (diabetes), hypertension, osteoporosis with long-term or frequent use, and rare risk of avascular necrosis of the hip.   3 Week Prednisone  Taper  You will be given a prescription for 150 tablets of oral Prednisone . It is very important that you take this according to the exact schedule provided below. This type of regimen for taking medication is often called a "taper", because your dosage will steadily decrease over a three week period until it is discontinued altogether.  ALWAYS take this medicine with food to prevent it from irritating your stomach. You should also take your Prednisone  during morning hours.  Call the clinic at [phone] if you gain more than two pounds in one day, notice swelling anywhere on your body, have shortness of breath, black or red bowel movements, brown or red vomitus, desire to drink large amounts of fluids, a fever, or extreme  weakness.   Oral Prednisone  over Three Weeks  Day  Week 1  Week 2  Week 3   1  12  tablets  9 tablets  5 tablets   2  12 tablets  8 tablets  5 tablets   3  11 tablets  8 tablets  4 tablets   4  11 tablets  7 tablets  4 tablets   5  10 tablets  7 tablets  3 tablets   6  10 tablets  6 tablets  2 tablets   7  9 tablets  6 tablets  1 tablet  3 Week Prednisone  Taper  You will be given a prescription for 150 tablets of oral Prednisone . It is very important that you take this according to the exact schedule provided below. This type of regimen for taking medication is often called a "taper", because your dosage will steadily decrease over a two week period until it is discontinued altogether.  ALWAYS take this medicine with food to prevent it from irritating your stomach. You should also take your Prednisone  during morning hours.  Call the clinic at [phone] if you gain more than two pounds in one day, notice swelling anywhere on your body, have shortness of breath, black or red bowel movements, brown or red vomitus, desire to drink large amounts of fluids, a fever, or extreme weakness.   Oral Prednisone  over Three Weeks  Day  Week 1  Week 2  Week 3   1  12  tablets  9 tablets  5 tablets   2  12 tablets  8 tablets  5 tablets   3  11  tablets  8 tablets  4 tablets   4  11 tablets  7 tablets  4 tablets   5  10 tablets  7 tablets  3 tablets   6  10 tablets  6 tablets  2 tablets   7  9 tablets  6 tablets  1 tablet    Recommend daily broad spectrum sunscreen SPF 30+ to sun-exposed areas, reapply every 2 hours as needed. Call for new or changing lesions.  Staying in the shade or wearing long sleeves, sun glasses (UVA+UVB protection) and wide brim hats (4-inch brim around the entire circumference of the hat) are also recommended for sun protection.      Melanoma ABCDEs  Melanoma is the most dangerous type of skin cancer, and is the leading cause of death from skin disease.  You are more  likely to develop melanoma if you: Have light-colored skin, light-colored eyes, or red or blond hair Spend a lot of time in the sun Tan regularly, either outdoors or in a tanning bed Have had blistering sunburns, especially during childhood Have a close family member who has had a melanoma Have atypical moles or large birthmarks  Early detection of melanoma is key since treatment is typically straightforward and cure rates are extremely high if we catch it early.   The first sign of melanoma is often a change in a mole or a new dark spot.  The ABCDE system is a way of remembering the signs of melanoma.  A for asymmetry:  The two halves do not match. B for border:  The edges of the growth are irregular. C for color:  A mixture of colors are present instead of an even brown color. D for diameter:  Melanomas are usually (but not always) greater than 6mm - the size of a pencil eraser. E for evolution:  The spot keeps changing in size, shape, and color.  Please check your skin once per month between visits. You can use a small mirror in front and a large mirror behind you to keep an eye on the back side or your body.   If you see any new or changing lesions before your next follow-up, please call to schedule a visit.  Please continue daily skin protection including broad spectrum sunscreen SPF 30+ to sun-exposed areas, reapplying every 2 hours as needed when you're outdoors.   Staying in the shade or wearing long sleeves, sun glasses (UVA+UVB protection) and wide brim hats (4-inch brim around the entire circumference of the hat) are also recommended for sun protection.      Due to recent changes in healthcare laws, you may see results of your pathology and/or laboratory studies on MyChart before the doctors have had a chance to review them. We understand that in some cases there may be results that are confusing or concerning to you. Please understand that not all results are received at the  same time and often the doctors may need to interpret multiple results in order to provide you with the best plan of care or course of treatment. Therefore, we ask that you please give us  2 business days to thoroughly review all your results before contacting the office for clarification. Should we see a critical lab result, you will be contacted sooner.   If You Need Anything After Your Visit  If you have any questions or concerns for your doctor, please call our main line at [phone] and press option 4 to reach your doctor's medical assistant. If  no one answers, please leave a voicemail as directed and we will return your call as soon as possible. Messages left after 4 pm will be answered the following business day.   You may also send us  a message via MyChart. We typically respond to MyChart messages within 1-2 business days.  For prescription refills, please ask your pharmacy to contact our office. Our fax number is [phone].  If you have an urgent issue when the clinic is closed that cannot wait until the next business day, you can page your doctor at the number below.    Please note that while we do our best to be available for urgent issues outside of office hours, we are not available 24/7.   If you have an urgent issue and are unable to reach us , you may choose to seek medical care at your doctor's office, retail clinic, urgent care center, or emergency room.  If you have a medical emergency, please immediately call 911 or go to the emergency department.  Pager Numbers  - Dr. Bary Likes: [phone]  - Dr. Annette Barters: [phone]  - Dr. Felipe Horton: [phone]   In the event of inclement weather, please call our main line at [phone] for an update on the status of any delays or closures.  Dermatology Medication Tips: Please keep the boxes that topical medications come in in order to help keep track of the instructions about where and how to use these. Pharmacies typically  print the medication instructions only on the boxes and not directly on the medication tubes.   If your medication is too expensive, please contact our office at [phone] option 4 or send us  a message through MyChart.   We are unable to tell what your co-pay for medications will be in advance as this is different depending on your insurance coverage. However, we may be able to find a substitute medication at lower cost or fill out paperwork to get insurance to cover a needed medication.   If a prior authorization is required to get your medication covered by your insurance company, please allow us  1-2 business days to complete this process.  Drug prices often vary depending on where the prescription is filled and some pharmacies may offer cheaper prices.  The website www.goodrx.com contains coupons for medications through different pharmacies. The prices here do not account for what the cost may be with help from insurance (it may be cheaper with your insurance), but the website can give you the price if you did not use any insurance.  - You can print the associated coupon and take it with your prescription to the pharmacy.  - You may also stop by our office during regular business hours and pick up a GoodRx coupon card.  - If you need your prescription sent electronically to a different pharmacy, notify our office through [facility] or by phone at [phone] option 4.     Si Usted Necesita Algo Despus de Su Visita  Tambin puede enviarnos un mensaje a travs de Clinical cytogeneticist. Por lo general respondemos a los mensajes de MyChart en el transcurso de 1 a 2 das hbiles.  Para renovar recetas, por favor pida a su farmacia que se ponga en contacto con nuestra oficina. Franz Jacks de fax es [place] [phone].  Si tiene un asunto urgente cuando la clnica est cerrada y que no puede esperar hasta el siguiente da hbil, puede llamar/localizar a su doctor(a) al nmero que aparece a  continuacin.   Por favor, tenga  en cuenta que aunque hacemos todo lo posible para estar disponibles para asuntos urgentes fuera del horario de [place], no estamos disponibles las 24 horas del da, los 7 [address]  [address] de la [place].   Si tiene un problema urgente y no puede comunicarse con nosotros, puede optar por buscar atencin mdica  en el consultorio de su doctor(a), en una clnica privada, en un centro de atencin urgente o en una sala de emergencias.  Si tiene Engineer, drilling, por favor llame inmediatamente al 911 o vaya a la sala de emergencias.  Nmeros de bper  - Dr. Bary Likes: [phone]  - Dra. Annette Barters: [phone]  - Dr. Felipe Horton: [phone]   En caso de inclemencias del tiempo, por favor llame a Lajuan Pila principal al [phone] para una actualizacin sobre el [place] de cualquier retraso o cierre.  Consejos para la medicacin en dermatologa: Por favor, guarde las cajas en las que vienen los medicamentos de uso tpico para ayudarle a seguir las instrucciones sobre dnde y cmo usarlos. Las farmacias generalmente imprimen las instrucciones del medicamento slo en las cajas y no directamente en los tubos del [place].   Si su medicamento es muy caro, por favor, pngase en contacto con Bettyjane Brunet llamando al [phone] y presione la opcin 4 o envenos un mensaje a travs de Clinical cytogeneticist.   No podemos decirle cul ser su copago por los medicamentos por adelantado ya que esto es diferente dependiendo de la cobertura de su seguro. Sin embargo, es posible que podamos encontrar un medicamento sustituto a Audiological scientist un formulario para que el seguro cubra el medicamento que se considera necesario.   Si se requiere una autorizacin previa para que su compaa de seguros [place] su medicamento, por favor permtanos de 1 a 2 das hbiles para completar este proceso.  Los precios de los medicamentos varan con frecuencia dependiendo del Environmental consultant de dnde se surte la receta  y alguna farmacias pueden ofrecer precios ms baratos.  El sitio web www.goodrx.com tiene cupones para medicamentos de Health and safety inspector. Los precios aqu no tienen en cuenta lo que podra costar con la ayuda del seguro (puede ser ms barato con su seguro), pero el sitio web puede darle el precio si no utiliz Tourist information centre manager.  - Puede imprimir el cupn correspondiente y llevarlo con su receta a la farmacia.  - Tambin puede pasar por nuestra oficina durante el horario de atencin regular y Education officer, museum una tarjeta de cupones de GoodRx.  - Si necesita que su receta se enve electrnicamente a una farmacia diferente, informe a nuestra oficina a travs de MyChart de [place] o por telfono llamando al [phone] y presione la opcin 4.

## 2024-05-17 ENCOUNTER — Encounter: Payer: Self-pay | Admitting: Nurse Practitioner

## 2024-06-19 ENCOUNTER — Other Ambulatory Visit: Payer: Self-pay | Admitting: Family Medicine

## 2024-06-19 DIAGNOSIS — E1159 Type 2 diabetes mellitus with other circulatory complications: Secondary | ICD-10-CM

## 2024-06-19 MED ORDER — ACCU-CHEK SOFTCLIX LANCETS MISC: 3 refills | Status: AC

## 2024-06-19 MED ORDER — ACCU-CHEK GUIDE TEST VI STRP: ORAL_STRIP | 3 refills | Status: AC

## 2024-07-12 ENCOUNTER — Other Ambulatory Visit: Payer: Self-pay | Admitting: Family Medicine

## 2024-07-12 ENCOUNTER — Other Ambulatory Visit (INDEPENDENT_AMBULATORY_CARE_PROVIDER_SITE_OTHER)

## 2024-07-12 DIAGNOSIS — E7800 Pure hypercholesterolemia, unspecified: Secondary | ICD-10-CM

## 2024-07-12 DIAGNOSIS — I639 Cerebral infarction, unspecified: Secondary | ICD-10-CM | POA: Diagnosis not present

## 2024-07-12 DIAGNOSIS — E785 Hyperlipidemia, unspecified: Secondary | ICD-10-CM | POA: Diagnosis not present

## 2024-07-16 LAB — NMR, LIPOPROFILE

## 2024-07-16 LAB — LIPOPROTEIN A (LPA): Lipoprotein (a): 226.4 nmol/L — ABNORMAL HIGH (ref <75.0)

## 2024-07-17 ENCOUNTER — Ambulatory Visit: Payer: Self-pay | Admitting: Internal Medicine

## 2024-07-17 DIAGNOSIS — E785 Hyperlipidemia, unspecified: Secondary | ICD-10-CM

## 2024-07-27 DIAGNOSIS — E119 Type 2 diabetes mellitus without complications: Secondary | ICD-10-CM | POA: Diagnosis not present

## 2024-07-31 ENCOUNTER — Ambulatory Visit (INDEPENDENT_AMBULATORY_CARE_PROVIDER_SITE_OTHER): Payer: Medicare Other | Admitting: *Deleted

## 2024-07-31 VITALS — Ht 66.0 in | Wt 135.0 lb

## 2024-07-31 DIAGNOSIS — Z0000 Encounter for general adult medical examination without abnormal findings: Secondary | ICD-10-CM | POA: Diagnosis not present

## 2024-07-31 NOTE — Progress Notes (Signed)
 Subjective:   Meghan Kerr is a 73 y.o. female who presents for Medicare Annual (Subsequent) preventive examination.  Visit Complete: Virtual I connected with  Meghan Kerr on 07/31/24 by a audio enabled telemedicine application and verified that I am speaking with the correct person using two identifiers.  Patient Location: Home  Provider Location: Home Office  I discussed the limitations of evaluation and management by telemedicine. The patient expressed understanding and agreed to proceed.  Vital Signs: Because this visit was a virtual/telehealth visit, some criteria may be missing or patient reported. Any vitals not documented were not able to be obtained and vitals that have been documented are patient reported.    Cardiac Risk Factors include: advanced age (>89men, >39 women);family history of premature cardiovascular disease     Objective:    Today's Vitals   07/31/24 1448  Weight: 135 lb (61.2 kg)  Height: 5' 6 (1.676 m)   Body mass index is 21.79 kg/m.     07/31/2024    2:42 PM 01/28/2024   12:19 PM 07/27/2023    2:05 PM 07/21/2022    1:30 PM 07/20/2021    1:22 PM 07/17/2020    9:46 AM 06/20/2019   10:16 AM  Advanced Directives  Does Patient Have a Medical Advance Directive? Yes Yes Yes No No No No  Type of Advance Directive Living will Healthcare Power of [employer] of [place];Living will    --  Does patient want to make changes to medical advance directive?  No - Patient declined     No - Patient declined   Copy of Healthcare Power of Attorney in Chart?   No - copy requested      Would patient like information on creating a medical advance directive?    No - Patient declined No - Patient declined Yes (MAU/Ambulatory/Procedural Areas - Information given) No - Patient declined      Data saved with a previous flowsheet row definition    Current Medications (verified) Outpatient Encounter Medications as of 07/31/2024  Medication Sig    Accu-Chek Softclix Lancets lancets Use to check blood sugar daily   acetaminophen  (TYLENOL ) 500 MG tablet Take 1,000 mg by mouth every 6 (six) hours as needed for mild pain (pain score 1-3), fever or headache.   amLODipine  (NORVASC ) 5 MG tablet TAKE 1 TABLET BY MOUTH EVERY DAY   aspirin  EC 81 MG tablet Take 1 tablet (81 mg total) by mouth daily. Swallow whole.   Blood Glucose Monitoring Suppl (ONE TOUCH ULTRA MINI) w/Device KIT USE TO CHECK BLOOD SUGAR DAILY   glucose blood (ACCU-CHEK GUIDE TEST) test strip Use to check blood sugar daily   Multiple Vitamin (MULTIVITAMIN WITH MINERALS) TABS tablet Take 1 tablet by mouth daily.   rosuvastatin  (CRESTOR ) 20 MG tablet Take 1 tablet (20 mg total) by mouth daily.   triamcinolone  cream (KENALOG ) 0.1 % Apply twice a day up to 2 weeks as needed to rash on arms, legs, torso.  Avoid applying to face, groin, and axilla.   Vitamin D , Cholecalciferol, 10 MCG (400 UNIT) CHEW Chew 1 tablet by mouth daily.   predniSONE  (DELTASONE ) 5 MG tablet Take as directed (Patient not taking: Reported on 07/31/2024)   No facility-administered encounter medications on file as of 07/31/2024.    Allergies (verified) Sulfa antibiotics, Sulfonamide derivatives, Amoxicillin, and Penicillins   History: Past Medical History:  Diagnosis Date   Hyperlipidemia    Hypertension    Past Surgical History:  Procedure  Laterality Date   APPENDECTOMY     ECTOPIC PREGNANCY SURGERY  1976   HAMMER TOE SURGERY Left 08/19/2017   hardware removed April 2019   TRIGGER FINGER RELEASE  2006   WRIST FRACTURE SURGERY     broken left wrist repair with plate   Family History  Problem Relation Age of Onset   Diabetes Mother    Heart disease Mother        cad and chf   Heart disease Father        heart attack (massive)   Cancer Maternal Grandmother        COLON   Stroke Maternal Grandfather    Diabetes Brother    Heart disease Brother 34       hert attack   Heart disease Brother 54        CABG    Stroke Sister    Heart disease Sister        VALVE REPLACEMENT   Breast cancer Sister    Breast cancer Cousin 52   Social History   Socioeconomic History   Marital status: Married    Spouse name: Not on file   Number of children: Not on file   Years of education: Not on file   Highest education level: Bachelor's degree (e.g., BA, AB, BS)  Occupational History   Occupation: TEACHER    Comment: Kindergarten  Tobacco Use   Smoking status: Never   Smokeless tobacco: Never  Vaping Use   Vaping status: Never Used  Substance and Sexual Activity   Alcohol use: No   Drug use: No   Sexual activity: Not Currently  Other Topics Concern   Not on file  Social History Narrative   Not on file   Social Drivers of Health   Financial Resource Strain: Low Risk  (07/31/2024)   Overall Financial Resource Strain (CARDIA)    Difficulty of Paying Living Expenses: Not hard at all  Food Insecurity: No Food Insecurity (07/31/2024)   Hunger Vital Sign    Worried About Running Out of Food in the Last Year: Never true    Ran Out of Food in the Last Year: Never true  Transportation Needs: No Transportation Needs (07/31/2024)   PRAPARE - Administrator, Civil Service (Medical): No    Lack of Transportation (Non-Medical): No  Physical Activity: Insufficiently Active (07/31/2024)   Exercise Vital Sign    Days of Exercise per Week: 2 days    Minutes of Exercise per Session: 50 min  Stress: No Stress Concern Present (07/31/2024)   [employer] of Occupational Health - Occupational Stress Questionnaire    Feeling of Stress: Only a little  Social Connections: Socially Integrated (07/31/2024)   Social Connection and Isolation Panel    Frequency of Communication with Friends and Family: More than three times a week    Frequency of Social Gatherings with Friends and Family: Three times a week    Attends Religious Services: More than 4 times per year    Active Member of Clubs or  Organizations: Yes    Attends Engineer, structural: More than 4 times per year    Marital Status: Married    Tobacco Counseling Counseling given: Not Answered   Clinical Intake:  Pre-visit preparation completed: Yes  Pain : No/denies pain     Diabetes: No  How often do you need to have someone help you when you read instructions, pamphlets, or other written materials from your doctor or pharmacy?:  1 - Never  Interpreter Needed?: No  Information entered by :: Mliss Graff LPN   Activities of Daily Living    07/31/2024    2:48 PM  In your present state of health, do you have any difficulty performing the following activities:  Hearing? 0  Vision? 1  Difficulty concentrating or making decisions? 0  Walking or climbing stairs? 0  Dressing or bathing? 0  Doing errands, shopping? 1  Preparing Food and eating ? N  Using the Toilet? N  In the past six months, have you accidently leaked urine? N  Do you have problems with loss of bowel control? N  Managing your Medications? N  Managing your Finances? N  Housekeeping or managing your Housekeeping? N    Patient Care Team: Avelina Greig BRAVO, MD as PCP - General Myrna Adine Anes, MD as Consulting Physician (Ophthalmology)  Indicate any recent Medical Services you may have received from other than [facility] providers in the past year (date may be approximate).     Assessment:   This is a routine wellness examination for Meghan Kerr.  Hearing/Vision screen Hearing Screening - Comments:: No trouble hearing Vision Screening - Comments:: Not up to date Meghan Kerr   Goals Addressed             This Visit's Progress    Patient Stated       Continue current life style       Depression Screen    07/31/2024    2:52 PM 02/02/2024    8:17 AM 01/05/2024    2:24 PM 09/29/2023   11:02 AM 09/29/2023   11:01 AM 07/27/2023    2:05 PM 07/27/2023    2:04 PM  PHQ 2/9 Scores  PHQ - 2 Score 0 0 2 1 1  0 0  PHQ- 9 Score 1 2 4 2        Fall  Risk    07/31/2024    2:37 PM 02/02/2024    8:16 AM 01/05/2024    2:24 PM 09/29/2023   11:01 AM 07/26/2023    7:45 PM  Fall Risk   Falls in the past year? 0 1 1 0 0  Number falls in past yr: 0 1 0 0 0  Injury with Fall? 0 1 1 0 0  Risk for fall due to :  History of fall(s)  No Fall Risks No Fall Risks  Follow up Falls evaluation completed;Education provided;Falls prevention discussed Falls evaluation completed  Falls evaluation completed Falls prevention discussed;Falls evaluation completed    MEDICARE RISK AT HOME: Medicare Risk at Home Any stairs in or around the home?: Yes If so, are there any without handrails?: No Home free of loose throw rugs in walkways, pet beds, electrical cords, etc?: Yes Adequate lighting in your home to reduce risk of falls?: Yes Life alert?: No Use of a cane, walker or w/c?: No Grab bars in the bathroom?: No Shower chair or bench in shower?: No Elevated toilet seat or a handicapped toilet?: No  TIMED UP AND GO:  Was the test performed?  No    Cognitive Function:    07/20/2021    1:34 PM 07/17/2020    9:52 AM 06/20/2019   10:30 AM 06/08/2018    8:27 AM  MMSE - Mini Mental State Exam  Orientation to time 5 5 5 5   Orientation to Place 5 5 5 5   Registration 3 3 3 3   Attention/ Calculation 5 5 0 0  Recall 3 3 3  3  Language- name 2 objects   0 0  Language- repeat 1 1 1 1   Language- follow 3 step command   0 3  Language- read & follow direction   0 0  Write a sentence   0 0  Copy design   0 0  Total score   17 20        07/31/2024    2:40 PM 07/27/2023    2:08 PM 07/21/2022    1:30 PM  6CIT Screen  What Year? 0 points 0 points 0 points  What month? 0 points 0 points 0 points  What time? 0 points 0 points 0 points  Count back from 20 0 points 0 points 0 points  Months in reverse 2 points 0 points 0 points  Repeat phrase 4 points 0 points 0 points  Total Score 6 points 0 points 0 points    Immunizations Immunization History  Administered  Date(s) Administered   Fluad Quad(high Dose 65+) 08/22/2019, 09/18/2021, 09/21/2022   Fluad Trivalent(High Dose 65+) 09/29/2023   Influenza Whole 10/24/2008, 10/23/2010   Influenza,inj,Quad PF,6+ Mos 09/13/2014, 01/15/2015, 12/01/2015, 12/03/2016, 10/26/2017, 10/03/2018   PFIZER(Purple Top)SARS-COV-2 Vaccination 02/01/2020, 02/22/2020, 09/29/2020, 07/10/2021   Pneumococcal Conjugate-13 09/13/2014   Pneumococcal Polysaccharide-23 10/24/2008, 01/15/2015   Td 12/27/2005    TDAP status: Due, Education has been provided regarding the importance of this vaccine. Advised may receive this vaccine at local pharmacy or Health Dept. Aware to provide a copy of the vaccination record if obtained from local pharmacy or Health Dept. Verbalized acceptance and understanding.  Flu Vaccine status: Up to date  Pneumococcal vaccine status: Up to date  Covid-19 vaccine status: Declined, Education has been provided regarding the importance of this vaccine but patient still declined. Advised may receive this vaccine at local pharmacy or Health Dept.or vaccine clinic. Aware to provide a copy of the vaccination record if obtained from local pharmacy or Health Dept. Verbalized acceptance and understanding.  Qualifies for Shingles Vaccine? Yes   Zostavax completed No   Shingrix Completed?: No.    Education has been provided regarding the importance of this vaccine. Patient has been advised to call insurance company to determine out of pocket expense if they have not yet received this vaccine. Advised may also receive vaccine at local pharmacy or Health Dept. Verbalized acceptance and understanding.  Screening Tests Health Maintenance  Topic Date Due   Zoster Vaccines- Shingrix (1 of 2) Never done   Diabetic kidney evaluation - Urine ACR  06/24/2010   DTaP/Tdap/Td (2 - Tdap) 12/28/2015   OPHTHALMOLOGY EXAM  11/11/2022   INFLUENZA VACCINE  07/27/2024   HEMOGLOBIN A1C  07/27/2024   FOOT EXAM  09/28/2024   Diabetic  kidney evaluation - eGFR measurement  02/01/2025   Medicare Annual Wellness (AWV)  07/31/2025   MAMMOGRAM  10/18/2025   Fecal DNA (Cologuard)  08/21/2026   DEXA SCAN  10/18/2028   Pneumococcal Vaccine: 50+ Years  Completed   Hepatitis C Screening  Completed   Hepatitis B Vaccines  Aged Out   HPV VACCINES  Aged Out   Meningococcal B Vaccine  Aged Out   COVID-19 Vaccine  Discontinued    Health Maintenance  Health Maintenance Due  Topic Date Due   Zoster Vaccines- Shingrix (1 of 2) Never done   Diabetic kidney evaluation - Urine ACR  06/24/2010   DTaP/Tdap/Td (2 - Tdap) 12/28/2015   OPHTHALMOLOGY EXAM  11/11/2022   INFLUENZA VACCINE  07/27/2024   HEMOGLOBIN A1C  07/27/2024  Colonoscopy  Education provided  Mammogram status: Completed  . Repeat every year  Bone Density status: Completed 2024. Results reflect: Bone density results: OSTEOPOROSIS. Repeat every 2 years.  Lung Cancer Screening: (Low Dose CT Chest recommended if Age 17-80 years, 20 pack-year currently smoking OR have quit w/in 15years.) does not qualify.   Lung Cancer Screening Referral:   Additional Screening:  Hepatitis C Screening: does not qualify; Completed 2016  Vision Screening: Recommended annual ophthalmology exams for early detection of glaucoma and other disorders of the eye. Is the patient up to date with their annual eye exam?  Yes  Who is the provider or what is the name of the office in which the patient attends annual eye exams? Meghan Kerr If pt is not established with a provider, would they like to be referred to a provider to establish care? No .   Dental Screening: Recommended annual dental exams for proper oral hygiene   Community Resource Referral / Chronic Care Management: CRR required this visit?  No   CCM required this visit?  No     Plan:     I have personally reviewed and noted the following in the patient's chart:   Medical and social history Use of alcohol, tobacco or illicit  drugs  Current medications and supplements including opioid prescriptions. Patient is not currently taking opioid prescriptions. Functional ability and status Nutritional status Physical activity Advanced directives List of other physicians Hospitalizations, surgeries, and ER visits in previous 12 months Vitals Screenings to include cognitive, depression, and falls Referrals and appointments  In addition, I have reviewed and discussed with patient certain preventive protocols, quality metrics, and best practice recommendations. A written personalized care plan for preventive services as well as general preventive health recommendations were provided to patient.     Mliss Graff, LPN   12/30/7972   After Visit Summary: (MyChart) Due to this being a telephonic visit, the after visit summary with patients personalized plan was offered to patient via MyChart   Nurse Notes:

## 2024-07-31 NOTE — Patient Instructions (Signed)
 Meghan Kerr , Thank you for taking time to come for your Medicare Wellness Visit. I appreciate your ongoing commitment to your health goals. Please review the following plan we discussed and let me know if I can assist you in the future.   Screening recommendations/referrals:  Mammogram: up to date Bone Density: up to date Recommended yearly ophthalmology/optometry visit for glaucoma screening and checkup Recommended yearly dental visit for hygiene and checkup  Vaccinations: Influenza vaccine:  Pneumococcal vaccine:  Tdap vaccine:  Shingles vaccine:   Preventive Care 65 Years and Older, Female Preventive care refers to lifestyle choices and visits with your health care provider that can promote health and wellness. What does preventive care include? A yearly physical exam. This is also called an annual well check. Dental exams once or twice a year. Routine eye exams. Ask your health care provider how often you should have your eyes checked. Personal lifestyle choices, including: Daily care of your teeth and gums. Regular physical activity. Eating a healthy diet. Avoiding tobacco and drug use. Limiting alcohol use. Practicing safe sex. Taking low-dose aspirin  every day. Taking vitamin and mineral supplements as recommended by your health care provider. What happens during an annual well check? The services and screenings done by your health care provider during your annual well check will depend on your age, overall health, lifestyle risk factors, and family history of disease. Counseling  Your health care provider may ask you questions about your: Alcohol use. Tobacco use. Drug use. Emotional well-being. Home and relationship well-being. Sexual activity. Eating habits. History of falls. Memory and ability to understand (cognition). Work and work Astronomer. Reproductive health. Screening  You may have the following tests or measurements: Height, weight, and  BMI. Blood pressure. Lipid and cholesterol levels. These may be checked every 5 years, or more frequently if you are over 40 years old. Skin check. Lung cancer screening. You may have this screening every year starting at age 30 if you have a 30-pack-year history of smoking and currently smoke or have quit within the past 15 years. Fecal occult blood test (FOBT) of the stool. You may have this test every year starting at age 70. Flexible sigmoidoscopy or colonoscopy. You may have a sigmoidoscopy every 5 years or a colonoscopy every 10 years starting at age 15. Hepatitis C blood test. Hepatitis B blood test. Sexually transmitted disease (STD) testing. Diabetes screening. This is done by checking your blood sugar (glucose) after you have not eaten for a while (fasting). You may have this done every 1-3 years. Bone density scan. This is done to screen for osteoporosis. You may have this done starting at age 60. Mammogram. This may be done every 1-2 years. Talk to your health care provider about how often you should have regular mammograms. Talk with your health care provider about your test results, treatment options, and if necessary, the need for more tests. Vaccines  Your health care provider may recommend certain vaccines, such as: Influenza vaccine. This is recommended every year. Tetanus, diphtheria, and acellular pertussis (Tdap, Td) vaccine. You may need a Td booster every 10 years. Zoster vaccine. You may need this after age 32. Pneumococcal 13-valent conjugate (PCV13) vaccine. One dose is recommended after age 54. Pneumococcal polysaccharide (PPSV23) vaccine. One dose is recommended after age 64. Talk to your health care provider about which screenings and vaccines you need and how often you need them. This information is not intended to replace advice given to you by your health care provider.  Make sure you discuss any questions you have with your health care provider. Document  Released: 01/09/2016 Document Revised: 09/01/2016 Document Reviewed: 10/14/2015 Elsevier Interactive Patient Education  2017 [employer].  Fall Prevention in the Home Falls can cause injuries. They can happen to people of all ages. There are many things you can do to make your home safe and to help prevent falls. What can I do on the outside of my home? Regularly fix the edges of walkways and driveways and fix any cracks. Remove anything that might make you trip as you walk through a door, such as a raised step or threshold. Trim any bushes or trees on the path to your home. Use bright outdoor lighting. Clear any walking paths of anything that might make someone trip, such as rocks or tools. Regularly check to see if handrails are loose or broken. Make sure that both sides of any steps have handrails. Any raised decks and porches should have guardrails on the edges. Have any leaves, snow, or ice cleared regularly. Use sand or salt on walking paths during winter. Clean up any spills in your garage right away. This includes oil or grease spills. What can I do in the bathroom? Use night lights. Install grab bars by the toilet and in the tub and shower. Do not use towel bars as grab bars. Use non-skid mats or decals in the tub or shower. If you need to sit down in the shower, use a plastic, non-slip stool. Keep the floor dry. Clean up any water that spills on the floor as soon as it happens. Remove soap buildup in the tub or shower regularly. Attach bath mats securely with double-sided non-slip rug tape. Do not have throw rugs and other things on the floor that can make you trip. What can I do in the bedroom? Use night lights. Make sure that you have a light by your bed that is easy to reach. Do not use any sheets or blankets that are too big for your bed. They should not hang down onto the floor. Have a firm chair that has side arms. You can use this for support while you get dressed. Do  not have throw rugs and other things on the floor that can make you trip. What can I do in the kitchen? Clean up any spills right away. Avoid walking on wet floors. Keep items that you use a lot in easy-to-reach places. If you need to reach something above you, use a strong step stool that has a grab bar. Keep electrical cords out of the way. Do not use floor polish or wax that makes floors slippery. If you must use wax, use non-skid floor wax. Do not have throw rugs and other things on the floor that can make you trip. What can I do with my stairs? Do not leave any items on the stairs. Make sure that there are handrails on both sides of the stairs and use them. Fix handrails that are broken or loose. Make sure that handrails are as long as the stairways. Check any carpeting to make sure that it is firmly attached to the stairs. Fix any carpet that is loose or worn. Avoid having throw rugs at the top or bottom of the stairs. If you do have throw rugs, attach them to the floor with carpet tape. Make sure that you have a light switch at the top of the stairs and the bottom of the stairs. If you do not have them,  ask someone to add them for you. What else can I do to help prevent falls? Wear shoes that: Do not have high heels. Have rubber bottoms. Are comfortable and fit you well. Are closed at the toe. Do not wear sandals. If you use a stepladder: Make sure that it is fully opened. Do not climb a closed stepladder. Make sure that both sides of the stepladder are locked into place. Ask someone to hold it for you, if possible. Clearly mark and make sure that you can see: Any grab bars or handrails. First and last steps. Where the edge of each step is. Use tools that help you move around (mobility aids) if they are needed. These include: Canes. Walkers. Scooters. Crutches. Turn on the lights when you go into a dark area. Replace any light bulbs as soon as they burn out. Set up your  furniture so you have a clear path. Avoid moving your furniture around. If any of your floors are uneven, fix them. If there are any pets around you, be aware of where they are. Review your medicines with your doctor. Some medicines can make you feel dizzy. This can increase your chance of falling. Ask your doctor what other things that you can do to help prevent falls. This information is not intended to replace advice given to you by your health care provider. Make sure you discuss any questions you have with your health care provider. Document Released: 10/09/2009 Document Revised: 05/20/2016 Document Reviewed: 01/17/2015 Elsevier Interactive Patient Education  2017 [employer].

## 2024-08-06 ENCOUNTER — Ambulatory Visit: Payer: Medicare Other | Admitting: Plastic Surgery

## 2024-08-27 DIAGNOSIS — E119 Type 2 diabetes mellitus without complications: Secondary | ICD-10-CM | POA: Diagnosis not present

## 2024-08-29 ENCOUNTER — Ambulatory Visit: Payer: Self-pay

## 2024-08-29 NOTE — Telephone Encounter (Signed)
  Left message for Mrs. Kayes that we will plan on seeing her as planned on September 5 but  If significant worsening may need urgent evaluation sooner at urgent care or ED.

## 2024-08-29 NOTE — Telephone Encounter (Signed)
 Will see patient as planned on September 5. If significant worsening may need urgent evaluation sooner at urgent care or ED.

## 2024-08-29 NOTE — Telephone Encounter (Signed)
 FYI Only or Action Required?: FYI only for provider.  Patient was last seen in primary care on 02/02/2024 by Avelina Greig BRAVO, MD.  Called Nurse Triage reporting Altered Mental Status.  Symptoms began several weeks ago.  Interventions attempted: Nothing.  Symptoms are: gradually worsening.  Triage Disposition: See PCP Within 2 Weeks  Patient/caregiver understands and will follow disposition?: Yes    Copied from CRM [id number]. Topic: Clinical - Red Word Triage >> Aug 29, 2024 10:29 AM Thersia BROCKS wrote: [facility] that prompted transfer to Nurse Triage: Patient husband Ritchie called in regarding patient, increased confusion, not sure if its due to the stroke that she had a couple of months ago , gets dizzy spells in the morning ask the same questions multiple times. Want to know the steps on what he needs to do next and if she needs to be seen by [facility] Reason for Disposition  [1] Longstanding confusion (e.g., dementia, stroke) AND [2] NO worsening or change  Answer Assessment - Initial Assessment Questions 1. LEVEL OF CONSCIOUSNESS: How are they (the patient) acting right now? (e.g., alert-oriented, confused, lethargic, stuporous, comatose)    Confusion, forgetful, misplaces items, gets frantic  2. ONSET: When did the confusion start?  (e.g., minutes, hours, days)      Ongoing prior to stroke in February.  3. PATTERN: Does this come and go, or has it been constant since it started?  Is it present now?     Comes and goes 4. ALCOHOL or DRUGS: Have they been drinking alcohol or taking any drugs?      denies 5. NARCOTIC MEDICINES: Have they been receiving any narcotic medications? (e.g., morphine, Vicodin)     denies 6. CAUSE: What do you think is causing the confusion?      unknown 7. OTHER SYMPTOMS: Are there any other symptoms? (e.g., difficulty breathing, fever, headache, weakness)     Urination frequency.  Protocols used: Confusion - Delirium-A-AH

## 2024-08-31 ENCOUNTER — Encounter: Payer: Self-pay | Admitting: Family Medicine

## 2024-08-31 ENCOUNTER — Ambulatory Visit: Payer: Self-pay | Admitting: Family Medicine

## 2024-08-31 ENCOUNTER — Ambulatory Visit: Admitting: Family Medicine

## 2024-08-31 VITALS — BP 150/70 | HR 59 | Temp 98.4°F | Ht 64.0 in | Wt 135.1 lb

## 2024-08-31 DIAGNOSIS — R410 Disorientation, unspecified: Secondary | ICD-10-CM | POA: Diagnosis not present

## 2024-08-31 DIAGNOSIS — R2689 Other abnormalities of gait and mobility: Secondary | ICD-10-CM

## 2024-08-31 DIAGNOSIS — E1169 Type 2 diabetes mellitus with other specified complication: Secondary | ICD-10-CM

## 2024-08-31 DIAGNOSIS — E1159 Type 2 diabetes mellitus with other circulatory complications: Secondary | ICD-10-CM

## 2024-08-31 DIAGNOSIS — Z7984 Long term (current) use of oral hypoglycemic drugs: Secondary | ICD-10-CM

## 2024-08-31 DIAGNOSIS — E785 Hyperlipidemia, unspecified: Secondary | ICD-10-CM | POA: Diagnosis not present

## 2024-08-31 DIAGNOSIS — Z8673 Personal history of transient ischemic attack (TIA), and cerebral infarction without residual deficits: Secondary | ICD-10-CM | POA: Diagnosis not present

## 2024-08-31 LAB — COMPREHENSIVE METABOLIC PANEL WITH GFR
ALT: 10 U/L (ref 0–35)
AST: 16 U/L (ref 0–37)
Albumin: 4 g/dL (ref 3.5–5.2)
Alkaline Phosphatase: 64 U/L (ref 39–117)
BUN: 25 mg/dL — ABNORMAL HIGH (ref 6–23)
CO2: 30 meq/L (ref 19–32)
Calcium: 9 mg/dL (ref 8.4–10.5)
Chloride: 104 meq/L (ref 96–112)
Creatinine, Ser: 0.82 mg/dL (ref 0.40–1.20)
GFR: 70.15 mL/min (ref >60.00)
Glucose, Bld: 111 mg/dL — ABNORMAL HIGH (ref 70–99)
Potassium: 4 meq/L (ref 3.5–5.1)
Sodium: 142 meq/L (ref 135–145)
Total Bilirubin: 0.6 mg/dL (ref 0.2–1.2)
Total Protein: 6.1 g/dL (ref 6.0–8.3)

## 2024-08-31 LAB — POC URINALSYSI DIPSTICK (AUTOMATED)
Bilirubin, UA: NEGATIVE
Glucose, UA: NEGATIVE
Ketones, UA: NEGATIVE
Leukocytes, UA: NEGATIVE
Nitrite, UA: NEGATIVE
Protein, UA: POSITIVE — AB
Spec Grav, UA: 1.025 (ref 1.010–1.025)
Urobilinogen, UA: 0.2 U/dL
pH, UA: 5.5 (ref 5.0–8.0)

## 2024-08-31 LAB — MICROALBUMIN / CREATININE URINE RATIO
Creatinine,U: 97 mg/dL
Microalb Creat Ratio: 78.2 mg/g — ABNORMAL HIGH (ref 0.0–30.0)
Microalb, Ur: 7.6 mg/dL — ABNORMAL HIGH (ref 0.0–1.9)

## 2024-08-31 LAB — CBC WITH DIFFERENTIAL/PLATELET
Basophils Absolute: 0 K/uL (ref 0.0–0.1)
Basophils Relative: 0.3 % (ref 0.0–3.0)
Eosinophils Absolute: 0.1 K/uL (ref 0.0–0.7)
Eosinophils Relative: 1.2 % (ref 0.0–5.0)
HCT: 40 % (ref 36.0–46.0)
Hemoglobin: 13.2 g/dL (ref 12.0–15.0)
Lymphocytes Relative: 14.4 % (ref 12.0–46.0)
Lymphs Abs: 1 K/uL (ref 0.7–4.0)
MCHC: 33 g/dL (ref 30.0–36.0)
MCV: 88.1 fl (ref 78.0–100.0)
Monocytes Absolute: 0.5 K/uL (ref 0.1–1.0)
Monocytes Relative: 6.9 % (ref 3.0–12.0)
Neutro Abs: 5.5 K/uL (ref 1.4–7.7)
Neutrophils Relative %: 77.2 % — ABNORMAL HIGH (ref 43.0–77.0)
Platelets: 205 K/uL (ref 150.0–400.0)
RBC: 4.54 Mil/uL (ref 3.87–5.11)
RDW: 13 % (ref 11.5–15.5)
WBC: 7.1 K/uL (ref 4.0–10.5)

## 2024-08-31 LAB — LIPID PANEL
Cholesterol: 131 mg/dL (ref 0–200)
HDL: 59.2 mg/dL (ref >39.00)
LDL Cholesterol: 60 mg/dL (ref 0–99)
NonHDL: 72.29
Total CHOL/HDL Ratio: 2
Triglycerides: 59 mg/dL (ref 0.0–149.0)
VLDL: 11.8 mg/dL (ref 0.0–40.0)

## 2024-08-31 LAB — VITAMIN D 25 HYDROXY (VIT D DEFICIENCY, FRACTURES): VITD: 28.56 ng/mL — ABNORMAL LOW (ref 30.00–100.00)

## 2024-08-31 LAB — HEMOGLOBIN A1C: Hgb A1c MFr Bld: 7 % — ABNORMAL HIGH (ref 4.6–6.5)

## 2024-08-31 LAB — VITAMIN B12: Vitamin B-12: 200 pg/mL — ABNORMAL LOW (ref 211–911)

## 2024-08-31 LAB — TSH: TSH: 1.01 u[IU]/mL (ref 0.35–5.50)

## 2024-08-31 NOTE — Progress Notes (Signed)
 Patient ID: Meghan Kerr, female    DOB: [birth year], 73 y.o.   MRN: [id number]  This visit was conducted in person.  BP (!) 150/70   Pulse (!) 59   Temp 98.4 F (36.9 C) (Temporal)   Ht 5' 4 (1.626 m)   Wt 135 lb 2 oz (61.3 kg)   SpO2 97%   BMI 23.19 kg/m    CC:  Chief Complaint  Patient presents with   Altered Mental Status    Subjective:   HPI: Meghan Kerr is Meghan 73 y.o. female presenting on 08/31/2024 for Altered Mental Status  History of type 2 diabetes, hyperlipidemia, TIA/CVA.  She presents today with her husband.   Having feeling like heads in Meghan fog, dizzy with bending over in the last several months.  More confusion in last 3-4 weeks... has good days and bad days.  Strange thing popping into mind. Recent memory decreased, forgets where things are located. Forgetting names.  Forgetting to take meds.  Remote memory is good.  No weakness. No focal numbness... this has resolved.   Good mood overall now, good sleep.  Eating smaller amounts.  Going to stretch class at [facility] once Meghan week. Wt Readings from Last 3 Encounters:  08/31/24 135 lb 2 oz (61.3 kg)  07/31/24 135 lb (61.2 kg)  03/02/24 146 lb (66.2 kg)     TIA in 01/2024  Followed by neurology 02/2024   Reviewed Dr. Katharina note... Unremarkable    Started on crestor  20 mg daily in 02/2024   FBS 105-108 more med for diabetes.  Lab Results  Component Value Date   HGBA1C 7.0 (H) 08/31/2024     Relevant past medical, surgical, family and social history reviewed and updated as indicated. Interim medical history since our last visit reviewed. Allergies and medications reviewed and updated. Outpatient Medications Prior to Visit  Medication Sig Dispense Refill   Accu-Chek Softclix Lancets lancets Use to check blood sugar daily 100 each 3   acetaminophen  (TYLENOL ) 500 MG tablet Take 1,000 mg by mouth every 6 (six) hours as needed for mild pain (pain score 1-3), fever or headache.     amLODipine   (NORVASC ) 5 MG tablet TAKE 1 TABLET BY MOUTH EVERY DAY 90 tablet 3   aspirin  EC 81 MG tablet Take 1 tablet (81 mg total) by mouth daily. Swallow whole. 30 tablet 12   Blood Glucose Monitoring Suppl (ONE TOUCH ULTRA MINI) w/Device KIT USE TO CHECK BLOOD SUGAR DAILY 1 kit 0   glucose blood (ACCU-CHEK GUIDE TEST) test strip Use to check blood sugar daily 100 each 3   rosuvastatin  (CRESTOR ) 20 MG tablet Take 1 tablet (20 mg total) by mouth daily. 90 tablet 3   Multiple Vitamin (MULTIVITAMIN WITH MINERALS) TABS tablet Take 1 tablet by mouth daily.     predniSONE  (DELTASONE ) 5 MG tablet Take as directed (Patient not taking: Reported on 07/31/2024) 150 tablet 0   triamcinolone  cream (KENALOG ) 0.1 % Apply twice Meghan day up to 2 weeks as needed to rash on arms, legs, torso.  Avoid applying to face, groin, and axilla. 80 g 2   Vitamin D , Cholecalciferol, 10 MCG (400 UNIT) CHEW Chew 1 tablet by mouth daily.     No facility-administered medications prior to visit.     Per HPI unless specifically indicated in ROS section below Review of Systems  Constitutional:  Positive for fatigue. Negative for fever.  HENT:  Negative for congestion.   Eyes:  Negative  for pain.  Respiratory:  Negative for cough and shortness of breath.   Cardiovascular:  Negative for chest pain, palpitations and leg swelling.  Gastrointestinal:  Negative for abdominal pain.  Genitourinary:  Negative for dysuria and vaginal bleeding.  Musculoskeletal:  Negative for back pain.  Neurological:  Negative for syncope, light-headedness and headaches.  Psychiatric/Behavioral:  Positive for confusion. Negative for dysphoric mood.    Objective:  BP (!) 150/70   Pulse (!) 59   Temp 98.4 F (36.9 C) (Temporal)   Ht 5' 4 (1.626 m)   Wt 135 lb 2 oz (61.3 kg)   SpO2 97%   BMI 23.19 kg/m   Wt Readings from Last 3 Encounters:  08/31/24 135 lb 2 oz (61.3 kg)  07/31/24 135 lb (61.2 kg)  03/02/24 146 lb (66.2 kg)      Physical  Exam Constitutional:      General: She is not in acute distress.    Appearance: Normal appearance. She is well-developed. She is not ill-appearing or toxic-appearing.  HENT:     Head: Normocephalic.     Right Ear: Hearing, tympanic membrane, ear canal and external ear normal. Tympanic membrane is not erythematous, retracted or bulging.     Left Ear: Hearing, tympanic membrane, ear canal and external ear normal. Tympanic membrane is not erythematous, retracted or bulging.     Nose: No mucosal edema or rhinorrhea.     Right Sinus: No maxillary sinus tenderness or frontal sinus tenderness.     Left Sinus: No maxillary sinus tenderness or frontal sinus tenderness.     Mouth/Throat:     Pharynx: Uvula midline.  Eyes:     General: Lids are normal. Lids are everted, no foreign bodies appreciated.     Conjunctiva/sclera: Conjunctivae normal.     Pupils: Pupils are equal, round, and reactive to light.  Neck:     Thyroid : No thyroid  mass or thyromegaly.     Vascular: No carotid bruit.     Trachea: Trachea normal.  Cardiovascular:     Rate and Rhythm: Normal rate and regular rhythm.     Pulses: Normal pulses.     Heart sounds: Normal heart sounds, S1 normal and S2 normal. No murmur heard.    No friction rub. No gallop.  Pulmonary:     Effort: Pulmonary effort is normal. No tachypnea or respiratory distress.     Breath sounds: Normal breath sounds. No decreased breath sounds, wheezing, rhonchi or rales.  Abdominal:     General: Bowel sounds are normal.     Palpations: Abdomen is soft.     Tenderness: There is no abdominal tenderness.  Musculoskeletal:     Cervical back: Normal range of motion and neck supple.  Skin:    General: Skin is warm and dry.     Findings: No rash.  Neurological:     Mental Status: She is alert and oriented to person, place, and time.     GCS: GCS eye subscore is 4. GCS verbal subscore is 5. GCS motor subscore is 6.     Cranial Nerves: No cranial nerve deficit.      Sensory: No sensory deficit.     Motor: No abnormal muscle tone.     Coordination: Coordination normal.     Gait: Gait normal.     Deep Tendon Reflexes: Reflexes are normal and symmetric.     Comments: Nml cerebellar exam   No papilledema  Psychiatric:        Mood and  Affect: Mood is not anxious or depressed.        Speech: Speech normal.        Behavior: Behavior normal. Behavior is cooperative.        Thought Content: Thought content normal.        Cognition and Memory: Memory is impaired. She exhibits impaired recent memory. She does not exhibit impaired remote memory.        Judgment: Judgment normal.      MMSE 28/30, 11 animal recall in 15 secs    Results for orders placed or performed in visit on 08/31/24  POCT Urinalysis Dipstick (Automated)   Collection Time: 08/31/24  9:14 AM  Result Value Ref Range   Color, UA Yellow    Clarity, UA Clear    Glucose, UA Negative Negative   Bilirubin, UA Negative    Ketones, UA Negative    Spec Grav, UA 1.025 1.010 - 1.025   Blood, UA Trace    pH, UA 5.5 5.0 - 8.0   Protein, UA Positive (Meghan) Negative   Urobilinogen, UA 0.2 0.2 or 1.0 E.U./dL   Nitrite, UA Negative    Leukocytes, UA Negative Negative  CBC with Differential/Platelet   Collection Time: 08/31/24 10:12 AM  Result Value Ref Range   WBC 7.1 4.0 - 10.5 K/uL   RBC 4.54 3.87 - 5.11 Mil/uL   Hemoglobin 13.2 12.0 - 15.0 g/dL   HCT 59.9 63.9 - 53.9 %   MCV 88.1 78.0 - 100.0 fl   MCHC 33.0 30.0 - 36.0 g/dL   RDW 86.9 88.4 - 84.4 %   Platelets 205.0 150.0 - 400.0 K/uL   Neutrophils Relative % 77.2 (H) 43.0 - 77.0 %   Lymphocytes Relative 14.4 12.0 - 46.0 %   Monocytes Relative 6.9 3.0 - 12.0 %   Eosinophils Relative 1.2 0.0 - 5.0 %   Basophils Relative 0.3 0.0 - 3.0 %   Neutro Abs 5.5 1.4 - 7.7 K/uL   Lymphs Abs 1.0 0.7 - 4.0 K/uL   Monocytes Absolute 0.5 0.1 - 1.0 K/uL   Eosinophils Absolute 0.1 0.0 - 0.7 K/uL   Basophils Absolute 0.0 0.0 - 0.1 K/uL  Comprehensive  metabolic panel with GFR   Collection Time: 08/31/24 10:12 AM  Result Value Ref Range   Sodium 142 135 - 145 mEq/L   Potassium 4.0 3.5 - 5.1 mEq/L   Chloride 104 96 - 112 mEq/L   CO2 30 19 - 32 mEq/L   Glucose, Bld 111 (H) 70 - 99 mg/dL   BUN 25 (H) 6 - 23 mg/dL   Creatinine, Ser 9.17 0.40 - 1.20 mg/dL   Total Bilirubin 0.6 0.2 - 1.2 mg/dL   Alkaline Phosphatase 64 39 - 117 U/L   AST 16 0 - 37 U/L   ALT 10 0 - 35 U/L   Total Protein 6.1 6.0 - 8.3 g/dL   Albumin 4.0 3.5 - 5.2 g/dL   GFR 29.84 >39.99 mL/min   Calcium  9.0 8.4 - 10.5 mg/dL  Vitamin B12   Collection Time: 08/31/24 10:12 AM  Result Value Ref Range   Vitamin B-12 200 (L) 211 - 911 pg/mL  Lipid Panel   Collection Time: 08/31/24 10:12 AM  Result Value Ref Range   Cholesterol 131 0 - 200 mg/dL   Triglycerides 40.9 0.0 - 149.0 mg/dL   HDL 40.79 >60.99 mg/dL   VLDL 88.1 0.0 - 59.9 mg/dL   LDL Cholesterol 60 0 - 99 mg/dL   Total  CHOL/HDL Ratio 2    NonHDL 72.29   Hemoglobin A1c   Collection Time: 08/31/24 10:12 AM  Result Value Ref Range   Hgb A1c MFr Bld 7.0 (H) 4.6 - 6.5 %  VITAMIN D  25 Hydroxy (Vit-D Deficiency, Fractures)   Collection Time: 08/31/24 10:12 AM  Result Value Ref Range   VITD 28.56 (L) 30.00 - 100.00 ng/mL  TSH   Collection Time: 08/31/24 10:12 AM  Result Value Ref Range   TSH 1.01 0.35 - 5.50 uIU/mL  Microalbumin/Creatinine Ratio, Urine   Collection Time: 08/31/24 10:12 AM  Result Value Ref Range   Microalb, Ur 7.6 (H) 0.0 - 1.9 mg/dL   Creatinine,U 02.9 mg/dL   Microalb Creat Ratio 78.2 (H) 0.0 - 30.0 mg/g    Assessment and Plan  Confusion Assessment & Plan:  Chronic, Gradual worsening in the last 3 to 4 weeks.  MMSE 28/30, 11 animal recall in 15 secs Evaluate with labs for possible secondary cause of worsening of baseline cognitive impairment. Followed by neurology, last office visit March 2025.    Orders: -     POCT Urinalysis Dipstick (Automated) -     CBC with  Differential/Platelet -     Comprehensive metabolic panel with GFR -     Vitamin B12 -     VITAMIN D  25 Hydroxy (Vit-D Deficiency, Fractures) -     TSH  Balance problem -     Ambulatory referral to Physical Therapy  History of cerebrovascular accident (CVA) greater than eight weeks in the past -     Ambulatory referral to Physical Therapy  Hyperlipidemia associated with type 2 diabetes mellitus (HCC) Assessment & Plan: Chronic, due for reevaluation.  Orders: -     Lipid panel  Type 2 diabetes mellitus with other circulatory complications (HTN) (HCC) Assessment & Plan: Chronic, tolerable he well-controlled but new diagnosis of microalbuminuria. Will start Jardiance  low-dose daily for kidney protection. Reevaluate in 3 months.  Orders: -     Hemoglobin A1c -     Microalbumin / creatinine urine ratio  Other orders -     Empagliflozin ; Take 1 tablet (10 mg total) by mouth daily before breakfast.  Dispense: 30 tablet; Refill: 11 -     Vitamin D3; Take 1 capsule (1.25 mg total) by mouth once Meghan week.  Dispense: 12 capsule; Refill: 0    No follow-ups on file.   Greig Ring, MD

## 2024-09-04 MED ORDER — VITAMIN D3 1.25 MG (50000 UT) PO CAPS: 1.0000 | ORAL_CAPSULE | ORAL | 0 refills | Status: AC

## 2024-09-04 MED ORDER — EMPAGLIFLOZIN 10 MG PO TABS: 10.0000 mg | ORAL_TABLET | Freq: Every day | ORAL | 11 refills | Status: AC

## 2024-09-19 DIAGNOSIS — R2689 Other abnormalities of gait and mobility: Secondary | ICD-10-CM | POA: Insufficient documentation

## 2024-09-19 DIAGNOSIS — R410 Disorientation, unspecified: Secondary | ICD-10-CM | POA: Insufficient documentation

## 2024-09-19 NOTE — Assessment & Plan Note (Signed)
 Chronic, due for reevaluation

## 2024-09-19 NOTE — Assessment & Plan Note (Addendum)
 Chronic, Gradual worsening in the last 3 to 4 weeks.  MMSE 28/30, 11 animal recall in 15 secs Evaluate with labs for possible secondary cause of worsening of baseline cognitive impairment. Followed by neurology, last office visit March 2025.

## 2024-09-19 NOTE — Assessment & Plan Note (Signed)
 Chronic, tolerable he well-controlled but new diagnosis of microalbuminuria. Will start Jardiance  low-dose daily for kidney protection. Reevaluate in 3 months.

## 2024-09-20 ENCOUNTER — Telehealth: Payer: Self-pay | Admitting: *Deleted

## 2024-09-20 ENCOUNTER — Other Ambulatory Visit: Payer: Self-pay | Admitting: Family Medicine

## 2024-09-20 DIAGNOSIS — Z1231 Encounter for screening mammogram for malignant neoplasm of breast: Secondary | ICD-10-CM

## 2024-09-20 NOTE — Telephone Encounter (Signed)
 Order for Mammogram placed in Epic. Spoke with Meghan Kerr and provided [facility] to call and schedule her appointment.

## 2024-09-20 NOTE — Telephone Encounter (Signed)
 Copied from CRM #[id number]. Topic: General - Other >> Sep 20, 2024  8:56 AM [place] A wrote: Reason for CRM: Patient would like to schedule Mammogram-please contact

## 2024-09-26 DIAGNOSIS — E119 Type 2 diabetes mellitus without complications: Secondary | ICD-10-CM | POA: Diagnosis not present

## 2024-10-01 ENCOUNTER — Other Ambulatory Visit: Payer: Self-pay | Admitting: Family Medicine

## 2024-10-01 NOTE — Telephone Encounter (Signed)
 Last office visit 08/31/24 for Confusion.  Triamcinolone  is not on current medication list.   Okay to refill?

## 2024-10-19 ENCOUNTER — Ambulatory Visit
Admission: RE | Admit: 2024-10-19 | Discharge: 2024-10-19 | Disposition: A | Discharge status: Home / Self Care | Source: Ambulatory Visit | Attending: Family Medicine | Admitting: Family Medicine

## 2024-10-19 DIAGNOSIS — Z1231 Encounter for screening mammogram for malignant neoplasm of breast: Secondary | ICD-10-CM | POA: Diagnosis not present

## 2024-10-24 ENCOUNTER — Ambulatory Visit: Payer: Self-pay | Admitting: Family Medicine

## 2024-10-27 DIAGNOSIS — E119 Type 2 diabetes mellitus without complications: Secondary | ICD-10-CM | POA: Diagnosis not present

## 2024-12-06 ENCOUNTER — Ambulatory Visit: Admitting: Family Medicine

## 2024-12-06 ENCOUNTER — Encounter: Payer: Self-pay | Admitting: Family Medicine

## 2024-12-06 VITALS — BP 120/78 | HR 60 | Temp 98.3°F | Ht 64.0 in | Wt 126.2 lb

## 2024-12-06 DIAGNOSIS — Z8673 Personal history of transient ischemic attack (TIA), and cerebral infarction without residual deficits: Secondary | ICD-10-CM

## 2024-12-06 DIAGNOSIS — R9082 White matter disease, unspecified: Secondary | ICD-10-CM | POA: Diagnosis not present

## 2024-12-06 DIAGNOSIS — R809 Proteinuria, unspecified: Secondary | ICD-10-CM

## 2024-12-06 DIAGNOSIS — E1159 Type 2 diabetes mellitus with other circulatory complications: Secondary | ICD-10-CM | POA: Diagnosis not present

## 2024-12-06 DIAGNOSIS — F01A0 Vascular dementia, mild, without behavioral disturbance, psychotic disturbance, mood disturbance, and anxiety: Secondary | ICD-10-CM | POA: Diagnosis not present

## 2024-12-06 DIAGNOSIS — I998 Other disorder of circulatory system: Secondary | ICD-10-CM

## 2024-12-06 DIAGNOSIS — E119 Type 2 diabetes mellitus without complications: Secondary | ICD-10-CM | POA: Diagnosis not present

## 2024-12-06 DIAGNOSIS — R42 Dizziness and giddiness: Secondary | ICD-10-CM | POA: Diagnosis not present

## 2024-12-06 DIAGNOSIS — R21 Rash and other nonspecific skin eruption: Secondary | ICD-10-CM | POA: Diagnosis not present

## 2024-12-06 DIAGNOSIS — E1129 Type 2 diabetes mellitus with other diabetic kidney complication: Secondary | ICD-10-CM | POA: Insufficient documentation

## 2024-12-06 DIAGNOSIS — Z7984 Long term (current) use of oral hypoglycemic drugs: Secondary | ICD-10-CM

## 2024-12-06 LAB — POCT GLYCOSYLATED HEMOGLOBIN (HGB A1C): Hemoglobin A1C: 6.5 % — AB (ref 4.0–5.6)

## 2024-12-06 MED ORDER — TRIAMCINOLONE ACETONIDE 0.5 % EX CREA: 1.0000 | TOPICAL_CREAM | Freq: Two times a day (BID) | CUTANEOUS | 0 refills | Status: AC

## 2024-12-06 NOTE — Assessment & Plan Note (Signed)
 Acute, most consistent with allergic dermatitis and dry skin dermatitis. Treat with triamcinolone  0.5% twice daily times several weeks.  She will contact me if symptoms are not improving as expected.  No known triggers.

## 2024-12-06 NOTE — Assessment & Plan Note (Signed)
 Chronic, intermittent She is doing fairly well at the moment with the vertigo but has been limited with exercise given recurrent spells. She was never seen for balance retraining by PT given she does not check MyChart.  Information given with phone number to call to set this up given today.

## 2024-12-06 NOTE — Patient Instructions (Signed)
 August 31, 2024   Meghan Kerr [address] [place] [address]      Dear [name]:   After careful review of the referral order placed by your physician, your referral has been processed for routing. See below for additional details. If you have questions regarding why this referral was placed, please contact the referring provider at [phone].      Appointment Scheduling:   We recognize that you are likely looking forward to scheduling your appointment.  Your referral has been sent to the appropriate office for scheduling. Usually, it takes 3-5 business days to process referrals once they are received.  Please allow this time for the office handling your referral to contact you directly to arrange your appointment.  If you have not been contacted within 5 business days, please call [phone] to make an appointment.            Information for Referral #: [id number]   Diagnoses:   R26.89 (ICD-10-CM) - Balance problem   Z86.73 (ICD-10-CM) - History of cerebrovascular accident (CVA) greater than eight weeks in the past   Procedures: [id number] (Custom) - AMB REFERRAL TO PHYSICAL THERAPY Authorization #:     Referring Provider Information Greig FORBES Ring [address] [place] [address]  [phone] Referring To Provider Information UNICE BUYS [address] [place] [address] [phone]  Referral Start Date: 08/31/2024 Referral End Date: 08/31/2025

## 2024-12-06 NOTE — Progress Notes (Signed)
 Patient ID: Meghan Kerr, female    DOB: [birth year], 73 y.o.   MRN: [id number]  This visit was conducted in person.  BP 120/78 (BP Location: Left Arm, Patient Position: Sitting, Cuff Size: Normal)   Pulse 60   Temp 98.3 F (36.8 C) (Temporal)   Ht 5' 4 (1.626 m)   Wt 126 lb 3.2 oz (57.2 kg)   SpO2 98%   BMI 21.66 kg/m    CC:  Chief Complaint  Patient presents with   Follow-up    3 month follow up    Subjective:   HPI: Meghan Kerr is a 73 y.o. female presenting on 12/06/2024 for Follow-up (3 month follow up)  She presents today with her husband.   Rash on  back x 1 month.. itchy.. Has tried cortisone cream, itch relief cream. Has not spread. No new exposures.  Memory loss.. likely early vascular dementia: Followed by Neurology.  At last OV.. B12 and vit D found to be low in memory loss work up.. no on supplements. Thyroid  and cbc normal.  Good mood overall now, good sleep.  No longer going to [facility].  Referred to PT for balance issues. Wt Readings from Last 3 Encounters:  12/06/24 126 lb 3.2 oz (57.2 kg)  08/31/24 135 lb 2 oz (61.3 kg)  07/31/24 135 lb (61.2 kg)     TIA in 01/2024  Followed by neurology 02/2024   Reviewed Dr. Katharina note... Unremarkable    Started on crestor  20 mg daily in 02/2024 LDL almost at goal < 55. Lab Results  Component Value Date   CHOL 131 08/31/2024   HDL 59.20 08/31/2024   LDLCALC 60 08/31/2024   LDLDIRECT 190.1 07/15/2011   TRIG 59.0 08/31/2024   CHOLHDL 2 08/31/2024   Diabetes:  At last OV started on jardiance  10 mg  given microalbuminuria. Using medications without difficulties: Hypoglycemic episodes: none Hyperglycemic episodes: none Feet problems: no ulcer Blood Sugars averaging: FBS 97-115 eye exam within last year:  Lab Results  Component Value Date   HGBA1C 6.5 (A) 12/06/2024    Wt Readings from Last 3 Encounters:  12/06/24 126 lb 3.2 oz (57.2 kg)  08/31/24 135 lb 2 oz (61.3 kg)  07/31/24 135 lb (61.2  kg)      Relevant past medical, surgical, family and social history reviewed and updated as indicated. Interim medical history since our last visit reviewed. Allergies and medications reviewed and updated. Outpatient Medications Prior to Visit  Medication Sig Dispense Refill   Accu-Chek Softclix Lancets lancets Use to check blood sugar daily 100 each 3   acetaminophen  (TYLENOL ) 500 MG tablet Take 1,000 mg by mouth every 6 (six) hours as needed for mild pain (pain score 1-3), fever or headache.     amLODipine  (NORVASC ) 5 MG tablet TAKE 1 TABLET BY MOUTH EVERY DAY 90 tablet 3   aspirin  EC 81 MG tablet Take 1 tablet (81 mg total) by mouth daily. Swallow whole. 30 tablet 12   Blood Glucose Monitoring Suppl (ONE TOUCH ULTRA MINI) w/Device KIT USE TO CHECK BLOOD SUGAR DAILY 1 kit 0   Cholecalciferol (VITAMIN D3) 1.25 MG (50000 UT) CAPS Take 1 capsule (1.25 mg total) by mouth once a week. 12 capsule 0   empagliflozin  (JARDIANCE ) 10 MG TABS tablet Take 1 tablet (10 mg total) by mouth daily before breakfast. 30 tablet 11   glucose blood (ACCU-CHEK GUIDE TEST) test strip Use to check blood sugar daily 100 each 3  rosuvastatin  (CRESTOR ) 20 MG tablet Take 1 tablet (20 mg total) by mouth daily. 90 tablet 3   triamcinolone  cream (KENALOG ) 0.5 % APPLY TO AFFECTED AREA TWICE A DAY 30 g 0   No facility-administered medications prior to visit.     Per HPI unless specifically indicated in ROS section below Review of Systems  Constitutional:  Positive for fatigue. Negative for fever.  HENT:  Negative for congestion.   Eyes:  Negative for pain.  Respiratory:  Negative for cough and shortness of breath.   Cardiovascular:  Negative for chest pain, palpitations and leg swelling.  Gastrointestinal:  Negative for abdominal pain.  Genitourinary:  Negative for dysuria and vaginal bleeding.  Musculoskeletal:  Negative for back pain.  Neurological:  Negative for syncope, light-headedness and headaches.   Psychiatric/Behavioral:  Positive for confusion. Negative for dysphoric mood.    Objective:  BP 120/78 (BP Location: Left Arm, Patient Position: Sitting, Cuff Size: Normal)   Pulse 60   Temp 98.3 F (36.8 C) (Temporal)   Ht 5' 4 (1.626 m)   Wt 126 lb 3.2 oz (57.2 kg)   SpO2 98%   BMI 21.66 kg/m   Wt Readings from Last 3 Encounters:  12/06/24 126 lb 3.2 oz (57.2 kg)  08/31/24 135 lb 2 oz (61.3 kg)  07/31/24 135 lb (61.2 kg)      Physical Exam Constitutional:      General: She is not in acute distress.    Appearance: Normal appearance. She is well-developed. She is not ill-appearing or toxic-appearing.  HENT:     Head: Normocephalic.     Right Ear: Hearing, tympanic membrane, ear canal and external ear normal. Tympanic membrane is not erythematous, retracted or bulging.     Left Ear: Hearing, tympanic membrane, ear canal and external ear normal. Tympanic membrane is not erythematous, retracted or bulging.     Nose: No mucosal edema or rhinorrhea.     Right Sinus: No maxillary sinus tenderness or frontal sinus tenderness.     Left Sinus: No maxillary sinus tenderness or frontal sinus tenderness.     Mouth/Throat:     Pharynx: Uvula midline.  Eyes:     General: Lids are normal. Lids are everted, no foreign bodies appreciated.     Conjunctiva/sclera: Conjunctivae normal.     Pupils: Pupils are equal, round, and reactive to light.  Neck:     Thyroid : No thyroid  mass or thyromegaly.     Vascular: No carotid bruit.     Trachea: Trachea normal.  Cardiovascular:     Rate and Rhythm: Normal rate and regular rhythm.     Pulses: Normal pulses.     Heart sounds: Normal heart sounds, S1 normal and S2 normal. No murmur heard.    No friction rub. No gallop.  Pulmonary:     Effort: Pulmonary effort is normal. No tachypnea or respiratory distress.     Breath sounds: Normal breath sounds. No decreased breath sounds, wheezing, rhonchi or rales.  Abdominal:     General: Bowel sounds are  normal.     Palpations: Abdomen is soft.     Tenderness: There is no abdominal tenderness.  Musculoskeletal:     Cervical back: Normal range of motion and neck supple.  Skin:    General: Skin is warm and dry.     Findings: No rash.  Neurological:     Mental Status: She is alert and oriented to person, place, and time.     GCS: GCS eye subscore  is 4. GCS verbal subscore is 5. GCS motor subscore is 6.     Cranial Nerves: No cranial nerve deficit.     Sensory: No sensory deficit.     Motor: No abnormal muscle tone.     Coordination: Coordination normal.     Gait: Gait normal.     Deep Tendon Reflexes: Reflexes are normal and symmetric.     Comments: Nml cerebellar exam   No papilledema  Psychiatric:        Mood and Affect: Mood is not anxious or depressed.        Speech: Speech normal.        Behavior: Behavior normal. Behavior is cooperative.        Thought Content: Thought content normal.        Cognition and Memory: Memory is impaired. She exhibits impaired recent memory. She does not exhibit impaired remote memory.        Judgment: Judgment normal.      MMSE 28/30, 11 animal recall in 15 secs    Results for orders placed or performed in visit on 12/06/24  POCT glycosylated hemoglobin (Hb A1C)   Collection Time: 12/06/24 10:18 AM  Result Value Ref Range   Hemoglobin A1C 6.5 (A) 4.0 - 5.6 %   HbA1c POC (<> result, manual entry)     HbA1c, POC (prediabetic range)     HbA1c, POC (controlled diabetic range)      Assessment and Plan  Type 2 diabetes mellitus with other circulatory complications (HTN) (HCC) Assessment & Plan: Chronic, significant improvement in control with initiation of Jardiance  10 mg daily.  Associated with microalbuminuria  Orders: -     POCT glycosylated hemoglobin (Hb A1C)  White matter disease of brain due to ischemia Assessment & Plan: Chronic changes of MRI showing progression since 2022.   History of TIA (transient ischemic  attack)  Vertigo Assessment & Plan: Chronic, intermittent She is doing fairly well at the moment with the vertigo but has been limited with exercise given recurrent spells. She was never seen for balance retraining by PT given she does not check MyChart.  Information given with phone number to call to set this up given today.   Rash Assessment & Plan: Acute, most consistent with allergic dermatitis and dry skin dermatitis. Treat with triamcinolone  0.5% twice daily times several weeks.  She will contact me if symptoms are not improving as expected.  No known triggers.   Mild vascular dementia without behavioral disturbance, psychotic disturbance, mood disturbance, or anxiety (HCC) Assessment & Plan: Chronic stable, on statin and baby aspirin , good blood pressure control, excellent diabetes control. Patient has no plans to follow-up with neurology unless memory worsening.  She has had some slight improvement in memory with B12 and vitamin D  supplementation.   Microalbuminuria due to type 2 diabetes mellitus (HCC)  Diabetes mellitus treated with oral medication (HCC)  Other orders -     Triamcinolone  Acetonide; Apply 1 Application topically 2 (two) times daily.  Dispense: 30 g; Refill: 0     Return in about 6 months (around 06/06/2025) for phone AMW,  fasting labs then CPE with me.   Greig Ring, MD

## 2024-12-06 NOTE — Assessment & Plan Note (Signed)
 Chronic, significant improvement in control with initiation of Jardiance  10 mg daily.  Associated with microalbuminuria

## 2024-12-06 NOTE — Assessment & Plan Note (Signed)
 Chronic changes of MRI showing progression since 2022.

## 2024-12-06 NOTE — Assessment & Plan Note (Addendum)
 Chronic stable, on statin and baby aspirin , good blood pressure control, excellent diabetes control. Patient has no plans to follow-up with neurology unless memory worsening.  She has had some slight improvement in memory with B12 and vitamin D  supplementation.

## 2024-12-14 ENCOUNTER — Other Ambulatory Visit: Payer: Self-pay | Admitting: Family Medicine

## 2024-12-28 ENCOUNTER — Telehealth: Payer: Self-pay | Admitting: *Deleted

## 2024-12-28 DIAGNOSIS — E1169 Type 2 diabetes mellitus with other specified complication: Secondary | ICD-10-CM

## 2024-12-28 DIAGNOSIS — E1159 Type 2 diabetes mellitus with other circulatory complications: Secondary | ICD-10-CM

## 2024-12-28 DIAGNOSIS — R410 Disorientation, unspecified: Secondary | ICD-10-CM

## 2024-12-28 NOTE — Addendum Note (Signed)
 Addended by: HOPE VEVA PARAS on: 12/28/2024 03:18 PM   Modules accepted: Orders

## 2024-12-28 NOTE — Telephone Encounter (Signed)
-----   Message from Veva JINNY Ferrari sent at 12/28/2024 11:55 AM EST ----- Regarding: Lab orders for [facility], 1.15.26 Patient is scheduled for CPX labs, please order future labs, Thanks , Veva

## 2025-01-01 ENCOUNTER — Ambulatory Visit (INDEPENDENT_AMBULATORY_CARE_PROVIDER_SITE_OTHER): Admitting: Dermatology

## 2025-01-01 ENCOUNTER — Encounter: Payer: Self-pay | Admitting: Dermatology

## 2025-01-01 DIAGNOSIS — L299 Pruritus, unspecified: Secondary | ICD-10-CM | POA: Diagnosis not present

## 2025-01-01 DIAGNOSIS — Z79899 Other long term (current) drug therapy: Secondary | ICD-10-CM | POA: Diagnosis not present

## 2025-01-01 DIAGNOSIS — L2089 Other atopic dermatitis: Secondary | ICD-10-CM

## 2025-01-01 DIAGNOSIS — Z7189 Other specified counseling: Secondary | ICD-10-CM

## 2025-01-01 MED ORDER — DUPILUMAB 300 MG/2ML ~~LOC~~ SOSY
600.0000 mg | PREFILLED_SYRINGE | SUBCUTANEOUS | Status: AC
  Administered 2025-01-01: 600 mg via SUBCUTANEOUS
  Administered 2025-01-16: 300 mg via SUBCUTANEOUS

## 2025-01-01 MED ORDER — TACROLIMUS 0.1 % EX OINT: TOPICAL_OINTMENT | Freq: Two times a day (BID) | CUTANEOUS | 2 refills | Status: AC

## 2025-01-01 MED ORDER — CLOBETASOL PROPIONATE 0.05 % EX SOLN: 1.0000 | Freq: Two times a day (BID) | CUTANEOUS | 2 refills | Status: AC

## 2025-01-01 MED ORDER — DUPIXENT 300 MG/2ML ~~LOC~~ SOAJ: 300.0000 mg | SUBCUTANEOUS | 6 refills | Status: AC

## 2025-01-01 NOTE — Progress Notes (Signed)
" ° °  Follow Up Visit   Subjective  Meghan Kerr is a 74 y.o. female who presents for the following: Rash. 4-6 weeks. Itching. Waking her up at night. Torso. Has been using Triamcinolone  that was prescribed by her PCP since December. States it is not helping but says her husband thinks it looks better. Hx of eczema as a child. Hx of prurigo nodularis/LSC.    The following portions of the chart were reviewed this encounter and updated as appropriate: medications, allergies, medical history  Review of Systems:  No other skin or systemic complaints except as noted in HPI or Assessment and Plan.  Objective  Well appearing patient in no apparent distress; mood and affect are within normal limits.  A focused examination was performed of the following areas: Torso, arms  Relevant exam findings are noted in the Assessment and Plan.    Assessment & Plan   OTHER ATOPIC DERMATITIS   This Visit - dupilumab  (DUPIXENT ) prefilled syringe 600 mg   ATOPIC DERMATITIS- Severe pruritus Exam: Scaly pink excoriated papules and patches at back, abdomen, arms 20% BSA  Chronic and persistent condition with duration or expected duration over one year. Condition is bothersome/symptomatic for patient. Currently flared.   Atopic dermatitis (eczema) is a chronic, relapsing, pruritic condition that can significantly affect quality of life. It is often associated with allergic rhinitis and/or asthma and can require treatment with topical medications, phototherapy, or in severe cases biologic injectable medication (Dupixent ; Adbry) or Oral JAK inhibitors.  Treatment Plan: Discussed starting Dupixent  with patient due to extensive involvement and severity. Pt is in agreement.   Recommend starting Dupixent  300 mg/2mL SQ QOW. Dupilumab  (Dupixent ) is a treatment given by injection for adults and children with moderate-to-severe atopic dermatitis. Goal is control of skin condition, not cure. It is given as 2  injections at the first dose followed by 1 injection every 2 weeks thereafter.  Young children are dosed monthly.   Dupixent  300mg /15mL x 2 injected SQ into the B/L upper arms. Patient tolerated injections well.    NDC: 9975-4085-97 Lot: [id number] Exp: 08/2026  Potential side effects include allergic reaction, herpes infections, injection site reactions and conjunctivitis (inflammation of the eyes).  The use of Dupixent  requires long term medication management, including periodic office visits.   Eczema Skin Care  Buy TWO 16oz jars of CeraVe moisturizing cream  [facility], [facility], [facility] (no prescription needed)  Costs about $15 per jar   Jar #1: Use as a moisturizer as needed. Can be applied to any area of the body. Use twice daily to unaffected areas.  Jar #2: Pour one 50ml bottle of clobetasol  0.05% solution into jar, mix well. Label this jar to indicate the medication has been added. Use twice daily to affected areas. Do not apply to face, groin or underarms.  Moisturizer may burn or sting initially. Try for at least 4 weeks.   Start Tacrolimus  0.1% ointment once or twice daily to affected ares as needed for itching.   Recommend gentle skin care. Use Dove soap for sensitive skin.   Return in about 1 month (around 02/01/2025) for Atopic Dermatitis Follow Up, Dupixent  Injection On Nurse Schedule in 2 weeks.  I, Jill Parcell, CMA, am acting as scribe for Rexene Rattler, MD.   Documentation: I have reviewed the above documentation for accuracy and completeness, and I agree with the above.  Rexene Rattler, MD  "

## 2025-01-01 NOTE — Patient Instructions (Addendum)
 Eczema Skin Care  Buy TWO 16oz jars of CeraVe moisturizing cream  [facility], [facility], [facility] (no prescription needed)  Costs about $15 per jar   Jar #1: Use as a moisturizer as needed. Can be applied to any area of the body. Use twice daily to unaffected areas.  Jar #2: Pour one 50ml bottle of clobetasol  0.05% solution into jar, mix well. Label this jar to indicate the medication has been added. Use twice daily to affected areas. Do not apply to face, groin or underarms.  Moisturizer may burn or sting initially. Try for at least 4 weeks.   Start Tacrolimus  ointment once or twice daily to affected ares as needed for itching.   Recommend gentle skin care. Use Dove soap for sensitive skin.   Due to recent changes in healthcare laws, you may see results of your pathology and/or laboratory studies on MyChart before the doctors have had a chance to review them. We understand that in some cases there may be results that are confusing or concerning to you. Please understand that not all results are received at the same time and often the doctors may need to interpret multiple results in order to provide you with the best plan of care or course of treatment. Therefore, we ask that you please give us  2 business days to thoroughly review all your results before contacting the office for clarification. Should we see a critical lab result, you will be contacted sooner.   If You Need Anything After Your Visit  If you have any questions or concerns for your doctor, please call our main line at [phone] and press option 4 to reach your doctor's medical assistant. If no one answers, please leave a voicemail as directed and we will return your call as soon as possible. Messages left after 4 pm will be answered the following business day.   You may also send us  a message via MyChart. We typically respond to MyChart messages within 1-2 business days.  For prescription refills, please ask your pharmacy to  contact our office. Our fax number is [phone].  If you have an urgent issue when the clinic is closed that cannot wait until the next business day, you can page your doctor at the number below.    Please note that while we do our best to be available for urgent issues outside of office hours, we are not available 24/7.   If you have an urgent issue and are unable to reach us , you may choose to seek medical care at your doctor's office, retail clinic, urgent care center, or emergency room.  If you have a medical emergency, please immediately call 911 or go to the emergency department.  Pager Numbers  - Dr. Hester: [phone]  - Dr. Jackquline: [phone]  - Dr. Claudene: [phone]   - Dr. Raymund: [phone]  In the event of inclement weather, please call our main line at [phone] for an update on the status of any delays or closures.  Dermatology Medication Tips: Please keep the boxes that topical medications come in in order to help keep track of the instructions about where and how to use these. Pharmacies typically print the medication instructions only on the boxes and not directly on the medication tubes.   If your medication is too expensive, please contact our office at [phone] option 4 or send us  a message through MyChart.   We are unable to tell what your co-pay for medications will be in advance as this is  different depending on your insurance coverage. However, we may be able to find a substitute medication at lower cost or fill out paperwork to get insurance to cover a needed medication.   If a prior authorization is required to get your medication covered by your insurance company, please allow us  1-2 business days to complete this process.  Drug prices often vary depending on where the prescription is filled and some pharmacies may offer cheaper prices.  The website www.goodrx.com contains coupons for medications through different pharmacies. The prices  here do not account for what the cost may be with help from insurance (it may be cheaper with your insurance), but the website can give you the price if you did not use any insurance.  - You can print the associated coupon and take it with your prescription to the pharmacy.  - You may also stop by our office during regular business hours and pick up a GoodRx coupon card.  - If you need your prescription sent electronically to a different pharmacy, notify our office through [facility] or by phone at [phone] option 4.     Si Usted Necesita Algo Despus de Su Visita  Tambin puede enviarnos un mensaje a travs de Clinical Cytogeneticist. Por lo general respondemos a los mensajes de MyChart en el transcurso de 1 a 2 das hbiles.  Para renovar recetas, por favor pida a su farmacia que se ponga en contacto con nuestra oficina. Randi lakes de fax es [place] [phone].  Si tiene un asunto urgente cuando la clnica est cerrada y que no puede esperar hasta el siguiente da hbil, puede llamar/localizar a su doctor(a) al nmero que aparece a continuacin.   Por favor, tenga en cuenta que aunque hacemos todo lo posible para estar disponibles para asuntos urgentes fuera del horario de [place], no estamos disponibles las 24 horas del da, los 7 [address]  [address] de la [place].   Si tiene un problema urgente y no puede comunicarse con nosotros, puede optar por buscar atencin mdica  en el consultorio de su doctor(a), en una clnica privada, en un centro de atencin urgente o en una sala de emergencias.  Si tiene engineer, drilling, por favor llame inmediatamente al 911 o vaya a la sala de emergencias.  Nmeros de bper  - Dr. Hester: [phone]  - Dra. Jackquline: [phone]  - Dr. Claudene: [phone]  - Dra. Kitts: [phone]  En caso de inclemencias del [place], por favor llame a nuestra lnea principal al [phone] para una actualizacin sobre el estado de cualquier retraso o cierre.  Consejos  para la medicacin en dermatologa: Por favor, guarde las cajas en las que vienen los medicamentos de uso tpico para ayudarle a seguir las instrucciones sobre dnde y cmo usarlos. Las farmacias generalmente imprimen las instrucciones del medicamento slo en las cajas y no directamente en los tubos del [place].   Si su medicamento es muy caro, por favor, pngase en contacto con landry rieger llamando al [phone] y presione la opcin 4 o envenos un mensaje a travs de Clinical Cytogeneticist.   No podemos decirle cul ser su copago por los medicamentos por adelantado ya que esto es diferente dependiendo de la cobertura de su seguro. Sin embargo, es posible que podamos encontrar un medicamento sustituto a audiological scientist un formulario para que el seguro cubra el medicamento que se considera necesario.   Si se requiere una autorizacin previa para que su compaa de seguros cubra su medicamento, por favor permtanos de 1 a  2 das hbiles para completar [address]  Los precios de los medicamentos varan con frecuencia dependiendo del environmental consultant de dnde se surte la receta y alguna farmacias pueden ofrecer precios ms baratos.  El sitio web www.goodrx.com tiene cupones para medicamentos de health and safety inspector. Los precios aqu no tienen en cuenta lo que podra costar con la ayuda del seguro (puede ser ms barato con su seguro), pero el sitio web puede darle el precio si no utiliz tourist information centre manager.  - Puede imprimir el cupn correspondiente y llevarlo con su receta a la farmacia.  - Tambin puede pasar por nuestra oficina durante el horario de atencin regular y education officer, museum una tarjeta de cupones de GoodRx.  - Si necesita que su receta se enve electrnicamente a una farmacia diferente, informe a nuestra oficina a travs de MyChart de [place] o por telfono llamando al [phone] y presione la opcin 4.

## 2025-01-10 ENCOUNTER — Other Ambulatory Visit

## 2025-01-10 ENCOUNTER — Ambulatory Visit: Payer: Self-pay | Admitting: Family Medicine

## 2025-01-10 DIAGNOSIS — E785 Hyperlipidemia, unspecified: Secondary | ICD-10-CM | POA: Diagnosis not present

## 2025-01-10 DIAGNOSIS — E7800 Pure hypercholesterolemia, unspecified: Secondary | ICD-10-CM

## 2025-01-10 DIAGNOSIS — E1169 Type 2 diabetes mellitus with other specified complication: Secondary | ICD-10-CM

## 2025-01-10 DIAGNOSIS — R410 Disorientation, unspecified: Secondary | ICD-10-CM

## 2025-01-10 DIAGNOSIS — E1159 Type 2 diabetes mellitus with other circulatory complications: Secondary | ICD-10-CM

## 2025-01-10 LAB — COMPREHENSIVE METABOLIC PANEL WITH GFR
ALT: 16 U/L (ref 3–35)
AST: 18 U/L (ref 5–37)
Albumin: 4.2 g/dL (ref 3.5–5.2)
Alkaline Phosphatase: 70 U/L (ref 39–117)
BUN: 18 mg/dL (ref 6–23)
CO2: 30 meq/L (ref 19–32)
Calcium: 9.3 mg/dL (ref 8.4–10.5)
Chloride: 108 meq/L (ref 96–112)
Creatinine, Ser: 0.84 mg/dL (ref 0.40–1.20)
GFR: 67.98 mL/min
Glucose, Bld: 112 mg/dL — ABNORMAL HIGH (ref 70–99)
Potassium: 3.5 meq/L (ref 3.5–5.1)
Sodium: 143 meq/L (ref 135–145)
Total Bilirubin: 0.5 mg/dL (ref 0.2–1.2)
Total Protein: 6.7 g/dL (ref 6.0–8.3)

## 2025-01-10 LAB — HEMOGLOBIN A1C: Hgb A1c MFr Bld: 6.8 % — ABNORMAL HIGH (ref 4.6–6.5)

## 2025-01-10 LAB — LIPID PANEL
Cholesterol: 143 mg/dL (ref 28–200)
HDL: 66 mg/dL
LDL Cholesterol: 65 mg/dL (ref 10–99)
NonHDL: 76.53
Total CHOL/HDL Ratio: 2
Triglycerides: 60 mg/dL (ref 10.0–149.0)
VLDL: 12 mg/dL (ref 0.0–40.0)

## 2025-01-10 NOTE — Progress Notes (Signed)
 No critical labs need to be addressed urgently. We will discuss labs in detail at upcoming office visit.

## 2025-01-13 LAB — NMR, LIPOPROFILE
Cholesterol, Total: 159 mg/dL (ref 100–199)
HDL Particle Number: 32.5 umol/L
HDL-C: 78 mg/dL
LDL Particle Number: 478 nmol/L
LDL Size: 20.9 nm
LDL-C (NIH Calc): 69 mg/dL (ref 0–99)
LP-IR Score: <25
Small LDL Particle Number: <90 nmol/L
Triglycerides: 57 mg/dL (ref 0–149)

## 2025-01-16 ENCOUNTER — Ambulatory Visit

## 2025-01-16 DIAGNOSIS — L2089 Other atopic dermatitis: Secondary | ICD-10-CM

## 2025-01-16 NOTE — Progress Notes (Signed)
 Patient here today for two week Dupixent  injection for atopic dermatitis.   Dupixent  syringe injected into right upper arm. Patient tolerated injection well.  LOT: [id number] EXP: 08/2026 NDC: 9975-4085-97  Alan Pizza, RMA

## 2025-01-17 ENCOUNTER — Ambulatory Visit: Admitting: Family Medicine

## 2025-01-17 ENCOUNTER — Encounter: Payer: Self-pay | Admitting: Family Medicine

## 2025-01-17 VITALS — BP 120/76 | HR 55 | Temp 97.7°F | Ht 64.0 in | Wt 125.6 lb

## 2025-01-17 DIAGNOSIS — Z0000 Encounter for general adult medical examination without abnormal findings: Secondary | ICD-10-CM

## 2025-01-17 DIAGNOSIS — E785 Hyperlipidemia, unspecified: Secondary | ICD-10-CM | POA: Diagnosis not present

## 2025-01-17 DIAGNOSIS — F01A0 Vascular dementia, mild, without behavioral disturbance, psychotic disturbance, mood disturbance, and anxiety: Secondary | ICD-10-CM

## 2025-01-17 DIAGNOSIS — L209 Atopic dermatitis, unspecified: Secondary | ICD-10-CM | POA: Diagnosis not present

## 2025-01-17 DIAGNOSIS — F3342 Major depressive disorder, recurrent, in full remission: Secondary | ICD-10-CM

## 2025-01-17 DIAGNOSIS — E1129 Type 2 diabetes mellitus with other diabetic kidney complication: Secondary | ICD-10-CM

## 2025-01-17 DIAGNOSIS — E1169 Type 2 diabetes mellitus with other specified complication: Secondary | ICD-10-CM | POA: Diagnosis not present

## 2025-01-17 DIAGNOSIS — Z7984 Long term (current) use of oral hypoglycemic drugs: Secondary | ICD-10-CM | POA: Diagnosis not present

## 2025-01-17 DIAGNOSIS — E1159 Type 2 diabetes mellitus with other circulatory complications: Secondary | ICD-10-CM | POA: Diagnosis not present

## 2025-01-17 DIAGNOSIS — I10 Essential (primary) hypertension: Secondary | ICD-10-CM | POA: Diagnosis not present

## 2025-01-17 DIAGNOSIS — R809 Proteinuria, unspecified: Secondary | ICD-10-CM | POA: Diagnosis not present

## 2025-01-17 LAB — HM DIABETES FOOT EXAM

## 2025-01-17 LAB — LIPOPROTEIN A (LPA): Lipoprotein (a): 213 nmol/L — ABNORMAL HIGH

## 2025-01-17 NOTE — Assessment & Plan Note (Signed)
 Chronic, almost at goal in office today   Amlodipine  5 mg p.o. daily

## 2025-01-17 NOTE — Assessment & Plan Note (Signed)
"   On SGLT2i  Not on ACE/ARb.SABRA consider if persistent. "

## 2025-01-17 NOTE — Assessment & Plan Note (Signed)
 Chronic stable, on statin and baby aspirin , good blood pressure control, excellent diabetes control. Patient has no plans to follow-up with neurology unless memory worsening.  She has had some slight improvement in memory with B12 and vitamin D  supplementation.

## 2025-01-17 NOTE — Assessment & Plan Note (Signed)
 Chronic,   LDL at goal < 70 on  crestor  20 mg daily.

## 2025-01-17 NOTE — Assessment & Plan Note (Signed)
History of major depressive disorder, in remission off of venlafaxine.

## 2025-01-17 NOTE — Progress Notes (Signed)
 "   Patient ID: Meghan Kerr, female    DOB: [birth year], 74 y.o.   MRN: [id number]  This visit was conducted in person.  BP 120/76 (BP Location: Left Arm, Patient Position: Sitting, Cuff Size: Normal)   Pulse (!) 55   Temp 97.7 F (36.5 C) (Oral)   Ht 5' 4 (1.626 m)   Wt 125 lb 9.6 oz (57 kg)   SpO2 99%   BMI 21.56 kg/m    CC:  Chief Complaint  Patient presents with   Annual Exam    Mr. Boggus pt's husband is in room with pt  Pt has no acute concerns today Pt needs to schedule eye exam    Subjective:   HPI: Meghan Kerr is a 74 y.o. female presenting on 01/17/2025 for Annual Exam (Mr. Schey pt's husband is in room with pt//Pt has no acute concerns today/Pt needs to schedule eye exam)  The patient presents for complete physical and review of chronic health problems. He/She also has the following acute concerns today: none  The patient saw a LPN or RN for medicare wellness visit. 07/31/2024  Prevention and wellness was reviewed in detail. Note reviewed and important notes copied below.  Vascular dementia: followed by neurology, Stable per husband.  Referred to PT for balance issues... has not done yet.  MDD/GAD:  She has successfully been able to wean off venlafaxine  XR 37.5 mg daily Flowsheet Row Office Visit from 01/17/2025 in [facility]  PHQ-2 Total Score 0   Hypertension:  Well-controlled on amlodipine  5 mg p.o. daily BP Readings from Last 3 Encounters:  01/17/25 120/76  12/06/24 120/78  08/31/24 (!) 150/70  Using medication without problems or lightheadedness:  occ Chest pain with exertion: none Edema: none Short of breath: none Average home BPs: Other issues: Wt Readings from Last 3 Encounters:  01/17/25 125 lb 9.6 oz (57 kg)  12/06/24 126 lb 3.2 oz (57.2 kg)  08/31/24 135 lb 2 oz (61.3 kg)  Body mass index is 21.56 kg/m.   Diabetes:  Now on jardiance  10 mg daily given microalbuminuria Lab Results   Component Value Date   HGBA1C 6.8 (H) 01/10/2025  Using medications without difficulties: Hypoglycemic episodes: Hyperglycemic episodes:none Feet problems: no ulcers Blood Sugars averaging: occ checking FBS 92 w-131 eye exam within last year: yes per Dr. Adine    Elevated Cholesterol: LDL at goal  < 70 with history of CVA  on rosuvastatin  20 mg p.o.  daily  Lab Results  Component Value Date   CHOL 143 01/10/2025   HDL 66.00 01/10/2025   LDLCALC 65 01/10/2025   LDLDIRECT 190.1 07/15/2011   TRIG 60.0 01/10/2025   CHOLHDL 2 01/10/2025  Using medications without problems: no SE. Muscle aches:  Diet compliance: fruits and veggies Exercise:  none. Other complaints:   Relevant past medical, surgical, family and social history reviewed and updated as indicated. Interim medical history since our last visit reviewed. Allergies and medications reviewed and updated. Outpatient Medications Prior to Visit  Medication Sig Dispense Refill   Accu-Chek Softclix Lancets lancets Use to check blood sugar daily 100 each 3   acetaminophen  (TYLENOL ) 500 MG tablet Take 1,000 mg by mouth every 6 (six) hours as needed for mild pain (pain score 1-3), fever or headache.     amLODipine  (NORVASC ) 5 MG tablet TAKE 1 TABLET BY MOUTH EVERY DAY 90 tablet 0   aspirin  EC 81 MG tablet Take 1 tablet (81 mg total)  by mouth daily. Swallow whole. 30 tablet 12   Blood Glucose Monitoring Suppl (ONE TOUCH ULTRA MINI) w/Device KIT USE TO CHECK BLOOD SUGAR DAILY 1 kit 0   Cholecalciferol (VITAMIN D3) 1.25 MG (50000 UT) CAPS Take 1 capsule (1.25 mg total) by mouth once a week. 12 capsule 0   clobetasol  (TEMOVATE ) 0.05 % external solution Apply 1 Application topically 2 (two) times daily. Avoid applying to face, groin, and axilla. 50 mL 2   Dupilumab  (DUPIXENT ) 300 MG/2ML SOAJ Inject 300 mg into the skin every 14 (fourteen) days. Starting at day 15 for maintenance. 4 mL 6   empagliflozin  (JARDIANCE ) 10 MG TABS tablet Take 1  tablet (10 mg total) by mouth daily before breakfast. 30 tablet 11   glucose blood (ACCU-CHEK GUIDE TEST) test strip Use to check blood sugar daily 100 each 3   rosuvastatin  (CRESTOR ) 20 MG tablet Take 1 tablet (20 mg total) by mouth daily. 90 tablet 3   tacrolimus  (PROTOPIC ) 0.1 % ointment Apply topically 2 (two) times daily. 100 g 2   triamcinolone  cream (KENALOG ) 0.5 % Apply 1 Application topically 2 (two) times daily. (Patient not taking: Reported on 01/17/2025) 30 g 0   Facility-Administered Medications Prior to Visit  Medication Dose Route Frequency Provider Last Rate Last Admin   dupilumab  (DUPIXENT ) prefilled syringe 600 mg  600 mg Subcutaneous Q14 Days Jackquline Sawyer, MD   300 mg at 01/16/25 1127     Per HPI unless specifically indicated in ROS section below Review of Systems  Constitutional:  Negative for fatigue and fever.  HENT:  Negative for congestion.   Eyes:  Negative for pain.  Respiratory:  Negative for cough and shortness of breath.   Cardiovascular:  Negative for chest pain, palpitations and leg swelling.  Gastrointestinal:  Negative for abdominal pain.  Genitourinary:  Negative for dysuria and vaginal bleeding.  Musculoskeletal:  Negative for back pain.  Neurological:  Negative for syncope, light-headedness and headaches.  Psychiatric/Behavioral:  Negative for dysphoric mood.    Objective:  BP 120/76 (BP Location: Left Arm, Patient Position: Sitting, Cuff Size: Normal)   Pulse (!) 55   Temp 97.7 F (36.5 C) (Oral)   Ht 5' 4 (1.626 m)   Wt 125 lb 9.6 oz (57 kg)   SpO2 99%   BMI 21.56 kg/m   Wt Readings from Last 3 Encounters:  01/17/25 125 lb 9.6 oz (57 kg)  12/06/24 126 lb 3.2 oz (57.2 kg)  08/31/24 135 lb 2 oz (61.3 kg)      Physical Exam Vitals and nursing note reviewed.  Constitutional:      General: She is not in acute distress.    Appearance: Normal appearance. She is well-developed. She is not ill-appearing or toxic-appearing.  HENT:     Head:  Normocephalic.     Right Ear: Hearing, tympanic membrane, ear canal and external ear normal.     Left Ear: Hearing, tympanic membrane, ear canal and external ear normal.     Nose: Nose normal.  Eyes:     General: Lids are normal. Lids are everted, no foreign bodies appreciated.     Conjunctiva/sclera: Conjunctivae normal.     Pupils: Pupils are equal, round, and reactive to light.  Neck:     Thyroid : No thyroid  mass or thyromegaly.     Vascular: No carotid bruit.     Trachea: Trachea normal.  Cardiovascular:     Rate and Rhythm: Normal rate and regular rhythm.  Heart sounds: Normal heart sounds, S1 normal and S2 normal. No murmur heard.    No gallop.  Pulmonary:     Effort: Pulmonary effort is normal. No respiratory distress.     Breath sounds: Normal breath sounds. No wheezing, rhonchi or rales.  Abdominal:     General: Bowel sounds are normal. There is no distension or abdominal bruit.     Palpations: Abdomen is soft. There is no fluid wave or mass.     Tenderness: There is no abdominal tenderness. There is no guarding or rebound.     Hernia: No hernia is present.  Musculoskeletal:     Cervical back: Normal range of motion and neck supple.  Lymphadenopathy:     Cervical: No cervical adenopathy.  Skin:    General: Skin is warm and dry.     Findings: No rash.  Neurological:     Mental Status: She is alert.     Cranial Nerves: No cranial nerve deficit.     Sensory: No sensory deficit.  Psychiatric:        Mood and Affect: Mood is not anxious or depressed.        Speech: Speech normal.        Behavior: Behavior normal. Behavior is cooperative.        Judgment: Judgment normal.   Bilateral varicose veins.  Diabetic foot exam: Normal inspection No skin breakdown No calluses  Normal DP pulses Normal sensation to light touch and monofilament Nails normal     Results for orders placed or performed in visit on 01/17/25  HM DIABETES FOOT EXAM   Collection Time:  01/17/25 12:00 AM  Result Value Ref Range   HM Diabetic Foot Exam done      COVID 19 screen:  No recent travel or known exposure to COVID19 The patient denies respiratory symptoms of COVID 19 at this time. The importance of social distancing was discussed today.   Assessment and Plan The patient's preventative maintenance and recommended screening tests for an annual wellness exam were reviewed in full today. Brought up to date unless services declined.  Counselled on the importance of diet, exercise, and its role in overall health and mortality. The patient's FH and SH was reviewed, including their home life, tobacco status, and drug and alcohol status.   Vaccines; Uptodate PNA, consider shingrix and Td at pharmacy,  S/P COVID19 vaccine x4, uptodate flu shot.  DVE/pap:pap not indicated, DVE  not indicated   Colon: Date of Last Colonoscopy: 05/28/2007.. In [place], Results: Hyperplastic Polyp, recommended repeat in 5 years. She request stool ifob instead. Had ifob neg 05/2015,   Cologuard.SABRA goldsmith [id number]  and 2024 negative, repeat in 2027 Mammo 09/2024 nml DEXA: osteopenia stable T-1.8 spine 09/06/2018, stable 09/2023 Hep C: completed                                            Routine general medical examination at a health care facility  Type 2 diabetes mellitus with other circulatory complications (HTN) (HCC) Assessment & Plan: Chronic, significant improvement in control with initiation of Jardiance  10 mg daily.  Associated with microalbuminuria   Hyperlipidemia associated with type 2 diabetes mellitus (HCC) Assessment & Plan: Chronic,   LDL at goal < 70 on  crestor  20 mg daily.    MDD (major depressive disorder), recurrent, in full remission Assessment & Plan: History  of major depressive disorder, in remission off of venlafaxine .   Essential hypertension Assessment & Plan: Chronic, almost at goal in office today   Amlodipine  5 mg p.o. daily   Mild vascular dementia  without behavioral disturbance, psychotic disturbance, mood disturbance, or anxiety (HCC) Assessment & Plan: Chronic stable, on statin and baby aspirin , good blood pressure control, excellent diabetes control. Patient has no plans to follow-up with neurology unless memory worsening.  She has had some slight improvement in memory with B12 and vitamin D  supplementation.   Microalbuminuria due to type 2 diabetes mellitus [facility]) Assessment & Plan:  On SGLT2i  Not on ACE/ARb.SABRA consider if persistent.   Atopic dermatitis, unspecified type Assessment & Plan: Followed by dermatology Dr. Jackquline.  Improving.                                    Greig Ring, MD   "

## 2025-01-17 NOTE — Assessment & Plan Note (Signed)
 Followed by dermatology Dr. Jackquline.  Improving.

## 2025-01-17 NOTE — Assessment & Plan Note (Signed)
 Chronic, significant improvement in control with initiation of Jardiance  10 mg daily.  Associated with microalbuminuria

## 2025-02-04 ENCOUNTER — Ambulatory Visit: Admitting: Dermatology

## 2025-05-27 ENCOUNTER — Encounter: Admitting: Dermatology

## 2025-07-11 ENCOUNTER — Other Ambulatory Visit

## 2025-07-18 ENCOUNTER — Ambulatory Visit: Admitting: Family Medicine

## 2025-08-02 ENCOUNTER — Ambulatory Visit
